# Patient Record
Sex: Female | Born: 1980 | Race: White | Hispanic: No | Marital: Married | Smoking: Current some day smoker
Health system: Southern US, Community
[De-identification: ages and names within clinical notes are randomized; demographics above are authoritative.]

## PROBLEM LIST (undated history)

## (undated) ENCOUNTER — Emergency Department (HOSPITAL_COMMUNITY): Discharge status: Against Medical Advice | Payer: Medicare Other

## (undated) DIAGNOSIS — M79606 Pain in leg, unspecified: Secondary | ICD-10-CM

## (undated) DIAGNOSIS — IMO0002 Reserved for concepts with insufficient information to code with codable children: Secondary | ICD-10-CM

## (undated) DIAGNOSIS — F419 Anxiety disorder, unspecified: Secondary | ICD-10-CM

## (undated) DIAGNOSIS — I639 Cerebral infarction, unspecified: Secondary | ICD-10-CM

## (undated) DIAGNOSIS — M549 Dorsalgia, unspecified: Secondary | ICD-10-CM

## (undated) DIAGNOSIS — G8929 Other chronic pain: Secondary | ICD-10-CM

## (undated) DIAGNOSIS — F32A Depression, unspecified: Secondary | ICD-10-CM

## (undated) DIAGNOSIS — K5090 Crohn's disease, unspecified, without complications: Secondary | ICD-10-CM

## (undated) DIAGNOSIS — F329 Major depressive disorder, single episode, unspecified: Secondary | ICD-10-CM

## (undated) DIAGNOSIS — J449 Chronic obstructive pulmonary disease, unspecified: Secondary | ICD-10-CM

## (undated) HISTORY — PX: APPENDECTOMY: SHX54

---

## 2008-10-06 DIAGNOSIS — E282 Polycystic ovarian syndrome: Secondary | ICD-10-CM | POA: Insufficient documentation

## 2009-03-26 DIAGNOSIS — D519 Vitamin B12 deficiency anemia, unspecified: Secondary | ICD-10-CM | POA: Insufficient documentation

## 2009-03-26 DIAGNOSIS — F1390 Sedative, hypnotic, or anxiolytic use, unspecified, uncomplicated: Secondary | ICD-10-CM | POA: Insufficient documentation

## 2009-03-26 DIAGNOSIS — D649 Anemia, unspecified: Secondary | ICD-10-CM | POA: Insufficient documentation

## 2009-03-28 DIAGNOSIS — M2550 Pain in unspecified joint: Secondary | ICD-10-CM | POA: Insufficient documentation

## 2009-03-28 DIAGNOSIS — M25549 Pain in joints of unspecified hand: Secondary | ICD-10-CM | POA: Insufficient documentation

## 2010-05-22 LAB — HM HEPATITIS C SCREENING LAB: HM Hepatitis Screen: NEGATIVE

## 2010-08-30 ENCOUNTER — Emergency Department: Payer: Self-pay | Admitting: *Deleted

## 2010-09-02 ENCOUNTER — Emergency Department: Payer: Self-pay | Admitting: Unknown Physician Specialty

## 2011-06-09 ENCOUNTER — Emergency Department: Payer: Self-pay | Admitting: Emergency Medicine

## 2011-06-09 LAB — URINALYSIS, COMPLETE
Glucose,UR: NEGATIVE mg/dL (ref 0–75)
Nitrite: NEGATIVE
Ph: 5 (ref 4.5–8.0)
Protein: NEGATIVE
RBC,UR: 2 /HPF (ref 0–5)
Squamous Epithelial: 12
WBC UR: 3 /HPF (ref 0–5)

## 2011-06-14 ENCOUNTER — Emergency Department: Payer: Self-pay | Admitting: Emergency Medicine

## 2011-06-14 LAB — CBC
HCT: 35.1 % (ref 35.0–47.0)
MCH: 27.5 pg (ref 26.0–34.0)
MCHC: 32.4 g/dL (ref 32.0–36.0)
MCV: 85 fL (ref 80–100)
Platelet: 364 10*3/uL (ref 150–440)
RBC: 4.14 10*6/uL (ref 3.80–5.20)
RDW: 15.6 % — ABNORMAL HIGH (ref 11.5–14.5)
WBC: 9.5 10*3/uL (ref 3.6–11.0)

## 2011-06-14 LAB — URINALYSIS, COMPLETE
Bilirubin,UR: NEGATIVE
Blood: NEGATIVE
Glucose,UR: NEGATIVE mg/dL (ref 0–75)
Ketone: NEGATIVE
Nitrite: NEGATIVE
Ph: 8 (ref 4.5–8.0)
Protein: 30
RBC,UR: 7 /HPF (ref 0–5)
Specific Gravity: 1.015 (ref 1.003–1.030)
WBC UR: 99 /HPF (ref 0–5)

## 2011-06-14 LAB — DRUG SCREEN, URINE
Amphetamines, Ur Screen: NEGATIVE (ref ?–1000)
Barbiturates, Ur Screen: NEGATIVE (ref ?–200)
Cocaine Metabolite,Ur ~~LOC~~: NEGATIVE (ref ?–300)
Opiate, Ur Screen: POSITIVE (ref ?–300)
Tricyclic, Ur Screen: POSITIVE (ref ?–1000)

## 2011-06-14 LAB — COMPREHENSIVE METABOLIC PANEL
Albumin: 3.1 g/dL — ABNORMAL LOW (ref 3.4–5.0)
Anion Gap: 5 — ABNORMAL LOW (ref 7–16)
BUN: 15 mg/dL (ref 7–18)
Calcium, Total: 8.6 mg/dL (ref 8.5–10.1)
Chloride: 104 mmol/L (ref 98–107)
Creatinine: 0.91 mg/dL (ref 0.60–1.30)
EGFR (African American): >60
Glucose: 81 mg/dL (ref 65–99)
Osmolality: 276 (ref 275–301)
SGOT(AST): 14 U/L — ABNORMAL LOW (ref 15–37)
SGPT (ALT): 17 U/L
Total Protein: 7.2 g/dL (ref 6.4–8.2)

## 2011-06-14 LAB — LIPASE, BLOOD: Lipase: 77 U/L (ref 73–393)

## 2011-06-14 LAB — PREGNANCY, URINE: Pregnancy Test, Urine: NEGATIVE m[IU]/mL

## 2011-07-02 ENCOUNTER — Emergency Department: Payer: Self-pay | Admitting: *Deleted

## 2011-07-02 LAB — CBC
HGB: 12.2 g/dL (ref 12.0–16.0)
MCHC: 32.2 g/dL (ref 32.0–36.0)
Platelet: 391 10*3/uL (ref 150–440)
RBC: 4.49 10*6/uL (ref 3.80–5.20)
WBC: 11.1 10*3/uL — ABNORMAL HIGH (ref 3.6–11.0)

## 2011-07-02 LAB — DRUG SCREEN, URINE
Barbiturates, Ur Screen: NEGATIVE (ref ?–200)
Benzodiazepine, Ur Scrn: POSITIVE (ref ?–200)
Cannabinoid 50 Ng, Ur ~~LOC~~: NEGATIVE (ref ?–50)
MDMA (Ecstasy)Ur Screen: NEGATIVE (ref ?–500)
Opiate, Ur Screen: POSITIVE (ref ?–300)
Phencyclidine (PCP) Ur S: NEGATIVE (ref ?–25)
Tricyclic, Ur Screen: NEGATIVE (ref ?–1000)

## 2011-07-02 LAB — URINALYSIS, COMPLETE
Bilirubin,UR: NEGATIVE
Blood: NEGATIVE
Ketone: NEGATIVE
Leukocyte Esterase: NEGATIVE
Nitrite: NEGATIVE
Ph: 5 (ref 4.5–8.0)
Protein: NEGATIVE
RBC,UR: 1 /HPF (ref 0–5)
Specific Gravity: 1.009 (ref 1.003–1.030)

## 2011-07-02 LAB — COMPREHENSIVE METABOLIC PANEL
Albumin: 3.9 g/dL (ref 3.4–5.0)
Bilirubin,Total: 0.1 mg/dL — ABNORMAL LOW (ref 0.2–1.0)
EGFR (African American): >60
EGFR (Non-African Amer.): >60
Glucose: 85 mg/dL (ref 65–99)
Osmolality: 297 (ref 275–301)
SGOT(AST): 18 U/L (ref 15–37)
SGPT (ALT): 24 U/L
Total Protein: 8.3 g/dL — ABNORMAL HIGH (ref 6.4–8.2)

## 2011-07-02 LAB — TSH: Thyroid Stimulating Horm: 0.335 u[IU]/mL — ABNORMAL LOW

## 2011-07-02 LAB — ETHANOL: Ethanol: 80 mg/dL

## 2011-07-02 LAB — PREGNANCY, URINE: Pregnancy Test, Urine: NEGATIVE m[IU]/mL

## 2011-07-17 ENCOUNTER — Emergency Department: Payer: Self-pay | Admitting: Emergency Medicine

## 2011-07-17 LAB — COMPREHENSIVE METABOLIC PANEL
Alkaline Phosphatase: 82 U/L (ref 50–136)
Anion Gap: 11 (ref 7–16)
Bilirubin,Total: 0.2 mg/dL (ref 0.2–1.0)
Chloride: 109 mmol/L — ABNORMAL HIGH (ref 98–107)
Creatinine: 0.69 mg/dL (ref 0.60–1.30)
EGFR (African American): >60
EGFR (Non-African Amer.): >60
Osmolality: 286 (ref 275–301)
Potassium: 3.7 mmol/L (ref 3.5–5.1)
SGPT (ALT): 22 U/L
Total Protein: 7.3 g/dL (ref 6.4–8.2)

## 2011-07-17 LAB — LIPASE, BLOOD: Lipase: 129 U/L (ref 73–393)

## 2011-07-17 LAB — CBC
HCT: 35.1 % (ref 35.0–47.0)
MCH: 26.6 pg (ref 26.0–34.0)
MCHC: 31.9 g/dL — ABNORMAL LOW (ref 32.0–36.0)
MCV: 83 fL (ref 80–100)
Platelet: 389 10*3/uL (ref 150–440)
RBC: 4.22 10*6/uL (ref 3.80–5.20)
RDW: 16.1 % — ABNORMAL HIGH (ref 11.5–14.5)

## 2011-07-18 LAB — URINALYSIS, COMPLETE
Glucose,UR: NEGATIVE mg/dL (ref 0–75)
Leukocyte Esterase: NEGATIVE
Nitrite: NEGATIVE
Protein: NEGATIVE
RBC,UR: 2 /HPF (ref 0–5)
Specific Gravity: 1.009 (ref 1.003–1.030)
Squamous Epithelial: 1
WBC UR: 2 /HPF (ref 0–5)

## 2012-02-11 ENCOUNTER — Other Ambulatory Visit: Payer: Self-pay | Admitting: Nurse Practitioner

## 2012-02-11 DIAGNOSIS — M549 Dorsalgia, unspecified: Secondary | ICD-10-CM

## 2012-02-11 DIAGNOSIS — IMO0002 Reserved for concepts with insufficient information to code with codable children: Secondary | ICD-10-CM

## 2012-02-11 DIAGNOSIS — R200 Anesthesia of skin: Secondary | ICD-10-CM

## 2012-02-14 ENCOUNTER — Ambulatory Visit
Admission: RE | Admit: 2012-02-14 | Discharge: 2012-02-14 | Disposition: A | Discharge status: Home / Self Care | Payer: Medicare Other | Source: Ambulatory Visit | Attending: Nurse Practitioner | Admitting: Nurse Practitioner

## 2012-02-14 VITALS — BP 121/85 | HR 97 | Ht 66.0 in | Wt 235.0 lb

## 2012-02-14 DIAGNOSIS — M5126 Other intervertebral disc displacement, lumbar region: Secondary | ICD-10-CM

## 2012-02-14 DIAGNOSIS — M549 Dorsalgia, unspecified: Secondary | ICD-10-CM

## 2012-02-14 DIAGNOSIS — IMO0002 Reserved for concepts with insufficient information to code with codable children: Secondary | ICD-10-CM

## 2012-02-14 DIAGNOSIS — R200 Anesthesia of skin: Secondary | ICD-10-CM

## 2012-02-14 MED ORDER — IOHEXOL 180 MG/ML  SOLN
1.0000 mL | Freq: Once | INTRAMUSCULAR | Status: AC | PRN
  Administered 2012-02-14: 1 mL via EPIDURAL

## 2012-02-14 MED ORDER — METHYLPREDNISOLONE ACETATE 40 MG/ML INJ SUSP (RADIOLOG
120.0000 mg | Freq: Once | INTRAMUSCULAR | Status: AC
  Administered 2012-02-14: 120 mg via EPIDURAL

## 2012-06-13 DIAGNOSIS — M51369 Other intervertebral disc degeneration, lumbar region without mention of lumbar back pain or lower extremity pain: Secondary | ICD-10-CM | POA: Insufficient documentation

## 2012-06-13 DIAGNOSIS — F32A Depression, unspecified: Secondary | ICD-10-CM | POA: Diagnosis present

## 2012-06-13 DIAGNOSIS — F339 Major depressive disorder, recurrent, unspecified: Secondary | ICD-10-CM | POA: Insufficient documentation

## 2012-06-13 DIAGNOSIS — F329 Major depressive disorder, single episode, unspecified: Secondary | ICD-10-CM | POA: Insufficient documentation

## 2012-08-25 DIAGNOSIS — K219 Gastro-esophageal reflux disease without esophagitis: Secondary | ICD-10-CM | POA: Insufficient documentation

## 2012-08-25 DIAGNOSIS — G43909 Migraine, unspecified, not intractable, without status migrainosus: Secondary | ICD-10-CM | POA: Insufficient documentation

## 2012-08-25 DIAGNOSIS — F1920 Other psychoactive substance dependence, uncomplicated: Secondary | ICD-10-CM | POA: Insufficient documentation

## 2012-08-25 DIAGNOSIS — Z8673 Personal history of transient ischemic attack (TIA), and cerebral infarction without residual deficits: Secondary | ICD-10-CM | POA: Insufficient documentation

## 2012-08-25 DIAGNOSIS — K589 Irritable bowel syndrome without diarrhea: Secondary | ICD-10-CM | POA: Insufficient documentation

## 2012-08-25 DIAGNOSIS — M5430 Sciatica, unspecified side: Secondary | ICD-10-CM | POA: Insufficient documentation

## 2012-08-25 DIAGNOSIS — F411 Generalized anxiety disorder: Secondary | ICD-10-CM | POA: Diagnosis present

## 2012-08-25 DIAGNOSIS — N949 Unspecified condition associated with female genital organs and menstrual cycle: Secondary | ICD-10-CM | POA: Insufficient documentation

## 2012-08-25 DIAGNOSIS — R45851 Suicidal ideations: Secondary | ICD-10-CM | POA: Insufficient documentation

## 2012-08-25 DIAGNOSIS — Q7649 Other congenital malformations of spine, not associated with scoliosis: Secondary | ICD-10-CM | POA: Insufficient documentation

## 2012-08-25 DIAGNOSIS — N8300 Follicular cyst of ovary, unspecified side: Secondary | ICD-10-CM | POA: Insufficient documentation

## 2012-08-25 DIAGNOSIS — F410 Panic disorder [episodic paroxysmal anxiety] without agoraphobia: Secondary | ICD-10-CM | POA: Diagnosis present

## 2013-07-17 DIAGNOSIS — R42 Dizziness and giddiness: Secondary | ICD-10-CM | POA: Insufficient documentation

## 2013-07-18 DIAGNOSIS — I951 Orthostatic hypotension: Secondary | ICD-10-CM | POA: Insufficient documentation

## 2013-07-18 DIAGNOSIS — H538 Other visual disturbances: Secondary | ICD-10-CM | POA: Insufficient documentation

## 2013-07-18 DIAGNOSIS — T443X1A Poisoning by other parasympatholytics [anticholinergics and antimuscarinics] and spasmolytics, accidental (unintentional), initial encounter: Secondary | ICD-10-CM | POA: Insufficient documentation

## 2013-07-20 DIAGNOSIS — R0600 Dyspnea, unspecified: Secondary | ICD-10-CM | POA: Insufficient documentation

## 2013-07-20 DIAGNOSIS — G4700 Insomnia, unspecified: Secondary | ICD-10-CM | POA: Insufficient documentation

## 2013-08-10 DIAGNOSIS — E559 Vitamin D deficiency, unspecified: Secondary | ICD-10-CM | POA: Insufficient documentation

## 2013-12-01 ENCOUNTER — Emergency Department (HOSPITAL_COMMUNITY): Payer: Medicare Other

## 2013-12-01 ENCOUNTER — Emergency Department (HOSPITAL_COMMUNITY)
Admission: EM | Admit: 2013-12-01 | Discharge: 2013-12-01 | Disposition: A | Discharge status: Home / Self Care | Payer: Medicare Other | Attending: Emergency Medicine | Admitting: Emergency Medicine

## 2013-12-01 ENCOUNTER — Encounter (HOSPITAL_COMMUNITY): Payer: Self-pay | Admitting: Emergency Medicine

## 2013-12-01 DIAGNOSIS — R109 Unspecified abdominal pain: Secondary | ICD-10-CM | POA: Diagnosis present

## 2013-12-01 DIAGNOSIS — G8929 Other chronic pain: Secondary | ICD-10-CM | POA: Diagnosis not present

## 2013-12-01 DIAGNOSIS — Z3202 Encounter for pregnancy test, result negative: Secondary | ICD-10-CM | POA: Diagnosis not present

## 2013-12-01 DIAGNOSIS — Z792 Long term (current) use of antibiotics: Secondary | ICD-10-CM | POA: Diagnosis not present

## 2013-12-01 DIAGNOSIS — N390 Urinary tract infection, site not specified: Secondary | ICD-10-CM | POA: Insufficient documentation

## 2013-12-01 DIAGNOSIS — F411 Generalized anxiety disorder: Secondary | ICD-10-CM | POA: Diagnosis not present

## 2013-12-01 DIAGNOSIS — M549 Dorsalgia, unspecified: Secondary | ICD-10-CM | POA: Insufficient documentation

## 2013-12-01 DIAGNOSIS — Z79899 Other long term (current) drug therapy: Secondary | ICD-10-CM | POA: Insufficient documentation

## 2013-12-01 DIAGNOSIS — K5090 Crohn's disease, unspecified, without complications: Secondary | ICD-10-CM | POA: Insufficient documentation

## 2013-12-01 HISTORY — DX: Anxiety disorder, unspecified: F41.9

## 2013-12-01 HISTORY — DX: Other chronic pain: G89.29

## 2013-12-01 HISTORY — DX: Crohn's disease, unspecified, without complications: K50.90

## 2013-12-01 HISTORY — DX: Dorsalgia, unspecified: M54.9

## 2013-12-01 LAB — URINE MICROSCOPIC-ADD ON

## 2013-12-01 LAB — RAPID URINE DRUG SCREEN, HOSP PERFORMED
Amphetamines: NOT DETECTED
BARBITURATES: NOT DETECTED
Benzodiazepines: POSITIVE — AB
COCAINE: NOT DETECTED
Opiates: NOT DETECTED
TETRAHYDROCANNABINOL: NOT DETECTED

## 2013-12-01 LAB — URINALYSIS, ROUTINE W REFLEX MICROSCOPIC
BILIRUBIN URINE: NEGATIVE
Glucose, UA: NEGATIVE mg/dL
KETONES UR: NEGATIVE mg/dL
NITRITE: POSITIVE — AB
Protein, ur: 100 mg/dL — AB
Specific Gravity, Urine: 1.014 (ref 1.005–1.030)
Urobilinogen, UA: 0.2 mg/dL (ref 0.0–1.0)
pH: 5.5 (ref 5.0–8.0)

## 2013-12-01 LAB — CBC WITH DIFFERENTIAL/PLATELET
Basophils Absolute: 0 10*3/uL (ref 0.0–0.1)
Basophils Relative: 0 % (ref 0–1)
Eosinophils Absolute: 0.2 10*3/uL (ref 0.0–0.7)
Eosinophils Relative: 2 % (ref 0–5)
HCT: 39 % (ref 36.0–46.0)
Hemoglobin: 12.4 g/dL (ref 12.0–15.0)
LYMPHS PCT: 28 % (ref 12–46)
Lymphs Abs: 3.4 10*3/uL (ref 0.7–4.0)
MCH: 27.4 pg (ref 26.0–34.0)
MCHC: 31.8 g/dL (ref 30.0–36.0)
MCV: 86.3 fL (ref 78.0–100.0)
MONO ABS: 0.6 10*3/uL (ref 0.1–1.0)
Monocytes Relative: 5 % (ref 3–12)
NEUTROS ABS: 8 10*3/uL — AB (ref 1.7–7.7)
Neutrophils Relative %: 65 % (ref 43–77)
Platelets: 402 10*3/uL — ABNORMAL HIGH (ref 150–400)
RBC: 4.52 MIL/uL (ref 3.87–5.11)
RDW: 14.2 % (ref 11.5–15.5)
WBC: 12.2 10*3/uL — ABNORMAL HIGH (ref 4.0–10.5)

## 2013-12-01 LAB — COMPREHENSIVE METABOLIC PANEL
ALT: 15 U/L (ref 0–35)
ANION GAP: 12 (ref 5–15)
AST: 18 U/L (ref 0–37)
Albumin: 3.6 g/dL (ref 3.5–5.2)
Alkaline Phosphatase: 100 U/L (ref 39–117)
BUN: 8 mg/dL (ref 6–23)
CHLORIDE: 109 meq/L (ref 96–112)
CO2: 21 meq/L (ref 19–32)
CREATININE: 0.69 mg/dL (ref 0.50–1.10)
Calcium: 9.3 mg/dL (ref 8.4–10.5)
GFR calc Af Amer: >90 mL/min (ref >90)
GLUCOSE: 80 mg/dL (ref 70–99)
Potassium: 3.8 mEq/L (ref 3.7–5.3)
Sodium: 142 mEq/L (ref 137–147)
Total Bilirubin: 0.2 mg/dL — ABNORMAL LOW (ref 0.3–1.2)
Total Protein: 7.6 g/dL (ref 6.0–8.3)

## 2013-12-01 LAB — LIPASE, BLOOD: LIPASE: 33 U/L (ref 11–59)

## 2013-12-01 LAB — POC URINE PREG, ED: Preg Test, Ur: NEGATIVE

## 2013-12-01 MED ORDER — FENTANYL CITRATE 0.05 MG/ML IJ SOLN
50.0000 ug | Freq: Once | INTRAMUSCULAR | Status: AC
  Administered 2013-12-01: 50 ug via INTRAVENOUS
  Filled 2013-12-01: qty 2

## 2013-12-01 MED ORDER — HYDROMORPHONE HCL PF 1 MG/ML IJ SOLN
1.0000 mg | Freq: Once | INTRAMUSCULAR | Status: AC
  Administered 2013-12-01: 1 mg via INTRAVENOUS
  Filled 2013-12-01: qty 1

## 2013-12-01 MED ORDER — ONDANSETRON HCL 4 MG/2ML IJ SOLN
4.0000 mg | Freq: Once | INTRAMUSCULAR | Status: AC
  Administered 2013-12-01: 4 mg via INTRAVENOUS
  Filled 2013-12-01: qty 2

## 2013-12-01 MED ORDER — IOHEXOL 300 MG/ML  SOLN
100.0000 mL | Freq: Once | INTRAMUSCULAR | Status: AC | PRN
  Administered 2013-12-01: 100 mL via INTRAVENOUS

## 2013-12-01 MED ORDER — CEPHALEXIN 500 MG PO CAPS: 500.0000 mg | ORAL_CAPSULE | Freq: Three times a day (TID) | ORAL | Status: DC

## 2013-12-01 MED ORDER — LOPERAMIDE HCL 2 MG PO CAPS: 2.0000 mg | ORAL_CAPSULE | Freq: Four times a day (QID) | ORAL | Status: DC | PRN

## 2013-12-01 MED ORDER — DEXTROSE 5 % IV SOLN
1.0000 g | Freq: Once | INTRAVENOUS | Status: AC
  Administered 2013-12-01: 1 g via INTRAVENOUS
  Filled 2013-12-01: qty 10

## 2013-12-01 MED ORDER — PROMETHAZINE HCL 25 MG RE SUPP: 25.0000 mg | Freq: Four times a day (QID) | RECTAL | Status: DC | PRN

## 2013-12-01 MED ORDER — SODIUM CHLORIDE 0.9 % IV BOLUS (SEPSIS)
1000.0000 mL | Freq: Once | INTRAVENOUS | Status: AC
  Administered 2013-12-01: 1000 mL via INTRAVENOUS

## 2013-12-01 MED ORDER — KETOROLAC TROMETHAMINE 30 MG/ML IJ SOLN
30.0000 mg | Freq: Once | INTRAMUSCULAR | Status: AC
  Administered 2013-12-01: 30 mg via INTRAVENOUS
  Filled 2013-12-01: qty 1

## 2013-12-01 MED ORDER — POTASSIUM CHLORIDE CRYS ER 20 MEQ PO TBCR
40.0000 meq | EXTENDED_RELEASE_TABLET | Freq: Once | ORAL | Status: DC
  Filled 2013-12-01: qty 2

## 2013-12-01 NOTE — Discharge Instructions (Signed)
Urinary Tract Infection Urinary tract infections (UTIs) can develop anywhere along your urinary tract. Your urinary tract is your body's drainage system for removing wastes and extra water. Your urinary tract includes two kidneys, two ureters, a bladder, and a urethra. Your kidneys are a pair of bean-shaped organs. Each kidney is about the size of your fist. They are located below your ribs, one on each side of your spine. CAUSES Infections are caused by microbes, which are microscopic organisms, including fungi, viruses, and bacteria. These organisms are so small that they can only be seen through a microscope. Bacteria are the microbes that most commonly cause UTIs. SYMPTOMS  Symptoms of UTIs may vary by age and gender of the patient and by the location of the infection. Symptoms in young women typically include a frequent and intense urge to urinate and a painful, burning feeling in the bladder or urethra during urination. Older women and men are more likely to be tired, shaky, and weak and have muscle aches and abdominal pain. A fever may mean the infection is in your kidneys. Other symptoms of a kidney infection include pain in your back or sides below the ribs, nausea, and vomiting. DIAGNOSIS To diagnose a UTI, your caregiver will ask you about your symptoms. Your caregiver also will ask to provide a urine sample. The urine sample will be tested for bacteria and white blood cells. White blood cells are made by your body to help fight infection. TREATMENT  Typically, UTIs can be treated with medication. Because most UTIs are caused by a bacterial infection, they usually can be treated with the use of antibiotics. The choice of antibiotic and length of treatment depend on your symptoms and the type of bacteria causing your infection. HOME CARE INSTRUCTIONS  If you were prescribed antibiotics, take them exactly as your caregiver instructs you. Finish the medication even if you feel better after you  have only taken some of the medication.  Drink enough water and fluids to keep your urine clear or pale yellow.  Avoid caffeine, tea, and carbonated beverages. They tend to irritate your bladder.  Empty your bladder often. Avoid holding urine for long periods of time.  Empty your bladder before and after sexual intercourse.  After a bowel movement, women should cleanse from front to back. Use each tissue only once. SEEK MEDICAL CARE IF:   You have back pain.  You develop a fever.  Your symptoms do not begin to resolve within 3 days. SEEK IMMEDIATE MEDICAL CARE IF:   You have severe back pain or lower abdominal pain.  You develop chills.  You have nausea or vomiting.  You have continued burning or discomfort with urination. MAKE SURE YOU:   Understand these instructions.  Will watch your condition.  Will get help right away if you are not doing well or get worse. Document Released: 01/17/2005 Document Revised: 10/09/2011 Document Reviewed: 05/18/2011 [facility] Patient Information 2015 [place], [place]. This information is not intended to replace advice given to you by your health care provider. Make sure you discuss any questions you have with your health care provider.  Crohn Disease Crohn disease is a long-term (chronic) soreness and redness (inflammation) of the intestines (bowel). It can affect any portion of the digestive tract, from the mouth to the anus. It can also cause problems outside the digestive tract. Crohn disease is closely related to a disease called ulcerative colitis (together, these two diseases are called inflammatory bowel disease).  CAUSES  The cause of  Crohn disease is not known. One Nelva Bush is that, in an easily affected person, the immune system is triggered to attack the body's own digestive tissue. Crohn disease runs in families. It seems to be more common in certain geographic areas and amongst certain races. There are no clear-cut dietary causes.    SYMPTOMS  Crohn disease can cause many different symptoms since it can affect many different parts of the body. Symptoms include:  Fatigue.  Weight loss.  Chronic diarrhea, sometime bloody.  Abdominal pain and cramps.  Fever.  Ulcers or canker sores in the mouth or rectum.  Anemia (low red blood cells).  Arthritis, skin problems, and eye problems may occur. Complications of Crohn disease can include:  Series of holes (perforation) of the bowel.  Portions of the intestines sticking to each other (adhesions).  Obstruction of the bowel.  Fistula formation, typically in the rectal area but also in other areas. A fistula is an opening between the bowels and the outside, or between the bowels and another organ.  A painful crack in the mucous membrane of the anus (rectal fissure). DIAGNOSIS  Your caregiver may suspect Crohn disease based on your symptoms and an exam. Blood tests may confirm that there is a problem. You may be asked to submit a stool specimen for examination. X-rays and CT scans may be necessary. Ultimately, the diagnosis is usually made after a procedure that uses a flexible tube that is inserted via your mouth or your anus. This is done under sedation and is called either an upper endoscopy or colonoscopy. With these tests, the specialist can take tiny tissue samples and remove them from the inside of the bowel (biopsy). Examination of this biopsy tissue under a microscope can reveal Crohn disease as the cause of your symptoms. Due to the many different forms that Crohn disease can take, symptoms may be present for several years before a diagnosis is made. TREATMENT  Medications are often used to decrease inflammation and control the immune system. These include medicines related to aspirin, steroid medications, and newer and stronger medications to slow down the immune system. Some medications may be used as suppositories or enemas. A number of other medications are  used or have been studied. Your caregiver will make specific recommendations. HOME CARE INSTRUCTIONS   Symptoms such as diarrhea can be controlled with medications. Avoid foods that have a laxative effect such as fresh fruit, vegetables, and dairy products. During flare-ups, you can rest your bowel by refraining from solid foods. Drink clear liquids frequently during the day. (Electrolyte or rehydrating fluids are best. Your caregiver can help you with suggestions.) Drink often to prevent loss of body fluids (dehydration). When diarrhea has cleared, eat small meals and more frequently. Avoid food additives and stimulants such as caffeine (coffee, tea, or chocolate). Enzyme supplements may help if you develop intolerance to a sugar in dairy products (lactose). Ask your caregiver or dietitian about specific dietary instructions.  Try to maintain a positive attitude. Learn relaxation techniques such as self-hypnosis, mental imaging, and muscle relaxation.  If possible, avoid stresses which can aggravate your condition.  Exercise regularly.  Follow your diet.  Always get plenty of rest. SEEK MEDICAL CARE IF:   Your symptoms fail to improve after a week or two of new treatment.  You experience continued weight loss.  You have ongoing cramps or loose bowels.  You develop a new skin rash, skin sores, or eye problems. SEEK IMMEDIATE MEDICAL CARE IF:   You  have worsening of your symptoms or develop new symptoms.  You have a fever.  You develop bloody diarrhea.  You develop severe abdominal pain. MAKE SURE YOU:   Understand these instructions.  Will watch your condition.  Will get help right away if you are not doing well or get worse. Document Released: 01/17/2005 Document Revised: 08/24/2013 Document Reviewed: 12/16/2006 [facility] Patient Information 2015 [place], [place]. This information is not intended to replace advice given to you by your health care provider. Make sure you discuss  any questions you have with your health care provider.

## 2013-12-01 NOTE — ED Notes (Addendum)
Pt states that she was given a fluid bolus at PCP 2 days ago in [place] because she was having a Crohn's attack. Pt went to multiple doctor's offices today and ultimately ended up at [facility] in [place] requesting fluids. UC stated that they did not do that there and so pt requested to come to [facility]. Has not been able to take home oxycodone, morphine or klonopin at home because they are expensive and she could not get them filled.22g L hand. ODT 4mg  Zofran given en route. Alert and oriented.

## 2013-12-01 NOTE — ED Provider Notes (Signed)
CSN: [id number]     Arrival date & time 12/01/13  1726 History   First MD Initiated Contact with Patient 12/01/13 1829     Chief Complaint  Patient presents with  . Abdominal Pain  . Back Pain   Patient is a 33 y.o. female presenting with abdominal pain and back pain. The history is provided by the patient. No language interpreter was used.  Abdominal Pain Pain location:  Generalized Pain quality: aching and sharp   Pain radiates to:  Back Pain severity:  Severe Onset quality:  Sudden Duration:  3 days Timing:  Constant Progression:  Worsening Chronicity:  Chronic (secondary to chrons disease) Context: not alcohol use, not awakening from sleep, not diet changes, not eating, not laxative use, not medication withdrawal, not previous surgeries, not recent illness, not recent sexual activity, not recent travel, not retching, not sick contacts, not suspicious food intake and not trauma   Relieved by:  Nothing Worsened by:  Bowel movements Ineffective treatments:  Bowel activity, heat and NSAIDs Associated symptoms: chills, diarrhea, fever, flatus, nausea and vomiting   Associated symptoms: no anorexia, no belching, no chest pain, no constipation, no cough, no dysuria, no fatigue, no hematemesis, no hematochezia, no hematuria, no melena, no shortness of breath, no sore throat, no vaginal bleeding and no vaginal discharge   Risk factors: no alcohol abuse, no aspirin use, not elderly, has not had multiple surgeries, no NSAID use, not obese, not pregnant and no recent hospitalization   Back Pain Associated symptoms: abdominal pain and fever   Associated symptoms: no chest pain and no dysuria     Past Medical History  Diagnosis Date  . Crohn's disease   . Chronic back pain   . Anxiety    No past surgical history on file. No family history on file. History  Substance Use Topics  . Smoking status: Not on file  . Smokeless tobacco: Not on file  . Alcohol Use: Not on file   OB History    Grav Para Term Preterm Abortions TAB SAB Ect Mult Living                 Review of Systems  Constitutional: Positive for fever and chills. Negative for fatigue.  HENT: Negative for sore throat.   Respiratory: Negative for cough and shortness of breath.   Cardiovascular: Negative for chest pain.  Gastrointestinal: Positive for nausea, vomiting, abdominal pain, diarrhea and flatus. Negative for constipation, melena, hematochezia, anorexia and hematemesis.  Genitourinary: Negative for dysuria, hematuria, vaginal bleeding and vaginal discharge.  Musculoskeletal: Positive for back pain.      Allergies  Gabapentin  Home Medications   Prior to Admission medications   Medication Sig Start Date End Date Taking? Authorizing Provider  albuterol (PROVENTIL) (2.5 MG/3ML) 0.083% nebulizer solution Take 2.5 mg by nebulization 2 (two) times daily as needed for wheezing or shortness of breath.   Yes Historical Provider, MD  clonazePAM (KLONOPIN) 2 MG tablet Take 2 mg by mouth 3 (three) times daily.   Yes Historical Provider, MD  dicyclomine (BENTYL) 20 MG tablet Take 20 mg by mouth 4 (four) times daily as needed for spasms.   Yes Historical Provider, MD  hydrOXYzine (ATARAX/VISTARIL) 50 MG tablet Take 100 mg by mouth at bedtime.   Yes Historical Provider, MD  ibuprofen (ADVIL,MOTRIN) 200 MG tablet Take 800 mg by mouth at bedtime.   Yes Historical Provider, MD  morphine (MS CONTIN) 30 MG 12 hr tablet Take 30 mg by mouth  every 12 (twelve) hours.   Yes Historical Provider, MD  Oxycodone HCl 10 MG TABS Take 10 mg by mouth 4 (four) times daily.   Yes Historical Provider, MD  cephALEXin (KEFLEX) 500 MG capsule Take 1 capsule (500 mg total) by mouth 3 (three) times daily. 12/01/13   Auden Wettstein A Forcucci, PA-C  loperamide (IMODIUM) 2 MG capsule Take 1 capsule (2 mg total) by mouth 4 (four) times daily as needed for diarrhea or loose stools. 12/01/13   Marvel Sapp A Forcucci, PA-C  promethazine (PHENERGAN) 25 MG  suppository Place 1 suppository (25 mg total) rectally every 6 (six) hours as needed for nausea or vomiting. 12/01/13   Americus Scheurich A Forcucci, PA-C   BP 95/67  Pulse 68  Temp(Src) 97.9 F (36.6 C) (Oral)  SpO2 100%  LMP 11/10/2013 Physical Exam  Nursing note and vitals reviewed. Constitutional: She is oriented to person, place, and time. She appears well-developed and well-nourished. No distress.  HENT:  Head: Normocephalic and atraumatic.  Mouth/Throat: Oropharynx is clear and moist. No oropharyngeal exudate.  Eyes: Conjunctivae and EOM are normal. Pupils are equal, round, and reactive to light. No scleral icterus.  Neck: Normal range of motion. Neck supple. No JVD present. No thyromegaly present.  Cardiovascular: Normal rate, regular rhythm, normal heart sounds and intact distal pulses.  Exam reveals no gallop and no friction rub.   No murmur heard. Pulmonary/Chest: Effort normal and breath sounds normal. No respiratory distress. She has no wheezes. She has no rales. She exhibits no tenderness.  Abdominal: Soft. Normal appearance and bowel sounds are normal. She exhibits no distension and no mass. There is no hepatosplenomegaly. There is generalized tenderness. There is no rebound, no guarding, no CVA tenderness, no tenderness at McBurney's point and negative Murphy's sign.  Musculoskeletal: Normal range of motion.  Lymphadenopathy:    She has no cervical adenopathy.  Neurological: She is alert and oriented to person, place, and time. No cranial nerve deficit. Coordination normal.  Skin: Skin is warm and dry. She is not diaphoretic.  Psychiatric: She has a normal mood and affect. Her behavior is normal. Judgment and thought content normal.    ED Course  Procedures (including critical care time) Labs Review Labs Reviewed  CBC WITH DIFFERENTIAL - Abnormal; Notable for the following:    WBC 12.2 (*)    Platelets 402 (*)    Neutro Abs 8.0 (*)    All other components within normal  limits  COMPREHENSIVE METABOLIC PANEL - Abnormal; Notable for the following:    Total Bilirubin 0.2 (*)    All other components within normal limits  URINALYSIS, ROUTINE W REFLEX MICROSCOPIC - Abnormal; Notable for the following:    APPearance TURBID (*)    Hgb urine dipstick LARGE (*)    Protein, ur 100 (*)    Nitrite POSITIVE (*)    Leukocytes, UA LARGE (*)    All other components within normal limits  URINE RAPID DRUG SCREEN (HOSP PERFORMED) - Abnormal; Notable for the following:    Benzodiazepines POSITIVE (*)    All other components within normal limits  URINE MICROSCOPIC-ADD ON - Abnormal; Notable for the following:    Bacteria, UA MANY (*)    All other components within normal limits  LIPASE, BLOOD  POC URINE PREG, ED    Imaging Review Ct Abdomen Pelvis W Contrast  12/01/2013   CLINICAL DATA:  Abdominal pain with nausea and vomiting ; history of Crohn's disease  EXAM: CT ABDOMEN AND PELVIS WITH CONTRAST  TECHNIQUE: Multidetector CT imaging of the abdomen and pelvis was performed using the standard protocol following bolus administration of intravenous contrast. Oral contrast was also administered.  CONTRAST:  100mL OMNIPAQUE IOHEXOL 300 MG/ML  SOLN  COMPARISON:  None.  FINDINGS: There is mild bibasilar atelectatic change. Lung bases are otherwise clear.  Liver is prominent, measuring 17.3 cm in length. There is a tiny cyst in the posterior segment of the right lobe of the liver. No other focal liver lesions are identified. Gallbladder wall is not thickened. There is no biliary duct dilatation.  Spleen, pancreas, and adrenals appear normal. Kidneys bilaterally show no mass or hydronephrosis on either side. There is no renal or ureteral calculus on either side.  In the pelvis, the wall of the urinary bladder is upper normal in thickness. Urinary bladder is midline. There is no pelvic mass or fluid collection. There appears to be some thickening of the wall of the rectum. There is no  perirectal fistula or mesenteric thickening.  There is evidence of previous surgery in the at ascending colonic region with clips in this area. Appendix is not seen; question previous appendectomy. Terminal ileum appears unremarkable on this study. There is no wall thickening or fistula in this area.  There is a small ventral hernia containing only fat.  There is no bowel obstruction. No free air or portal venous air. There is no ascites, adenopathy, or abscess in the abdomen or pelvis. There is no abdominal aortic aneurysm. There are no blastic or lytic bone lesions. There is thoracolumbar levoscoliosis.  IMPRESSION: Rectal wall thickening consistent with a degree of proctitis. There is no fistula or perirectal stranding in the adjacent soft tissues.  The wall of the urinary bladder is upper normal in thickness. Mild cystitis cannot be excluded on this study. There is no renal or ureteral calculus. No hydronephrosis.  Question previous appendectomy. Terminal ileal region appears normal. No bowel obstruction. No abscess.  Liver is prominent.  Small ventral hernia containing only fat.   Electronically Signed   By: Bretta BangWilliam  Woodruff M.D.   On: 12/01/2013 21:55   Dg Abd 2 Views  12/01/2013   CLINICAL DATA:  Abdominal pain  EXAM: ABDOMEN - 2 VIEW  COMPARISON:  None.  FINDINGS: Frontal and left lateral decubitus images were obtained. There is contrast in the colon. There is moderate stool in the colon. There is a paucity of gas overall. No free air is seen. No abnormal calcifications.  IMPRESSION: Moderate diffuse stool in colon. Paucity of gas. This finding may be seen normally but also may be seen with enteritis or early ileus. Obstruction can also present in this manner but is felt to be less likely. No free air.   Electronically Signed   By: Bretta BangWilliam  Woodruff M.D.   On: 12/01/2013 19:30     EKG Interpretation None      MDM   Final diagnoses:  Crohn disease, without complications  UTI (lower urinary  tract infection)   Patient is a 33 y.o. Female who presents the ED with abdominal pain and back pain.  CBC shows mild leukocytosis at this time.   CMP shows mild hypokalemia which was treated here.  Lipase here is without abnormality at this time.  Plain film of the abdomen shows diffuse stool with possible ileus vs. Small bowel obstruction.  CT scan of the abdomen shows small ventral hernia non-incarcerated, mild procitis likely secondary to chron's disease, and no bowel obstruction.  UA shows UTI.  Have treated  the patient here with fluids, zofran, 1g ceftriaxone, and pain medication.  Patient is tolerating PO fluids here at this time.  Will discharge the patient home with keflex 500 mg TID x 7 days, phenergan suppository, and immodium and will refer the patient to GI for further follow-up of her Crohns disease.  Patient is stable for discharge at this time.  I have spoken with Dr. Denton Lank who agrees with the above plan.    Eben Burow, PA-C 12/01/13 2303

## 2013-12-01 NOTE — ED Notes (Signed)
Pt. Is aware that we need a urine specimen but is not able to go at this time.

## 2013-12-01 NOTE — ED Notes (Signed)
Patient transported to CT, will obtain vitals when pt returns.

## 2013-12-01 NOTE — ED Notes (Signed)
Let pt know that we need a urine sample from her, pt was hard to understand and had trouble staying awake while talking to me. Pt stated that she did not need to use the bathroom right now

## 2013-12-01 NOTE — ED Notes (Signed)
Bed: WA17 Expected date:  Expected time:  Means of arrival:  Comments: EMS-abdominal pain 

## 2013-12-02 NOTE — ED Provider Notes (Signed)
Medical screening examination/treatment/procedure(s) were conducted as a shared visit with non-physician practitioner(s) and myself.  I personally evaluated the patient during the encounter.  Pt with hx crohns, c/o suprapubic pain, dysyuria. Also has had few loose stools although no bloody bms, no fevers. abd soft, suprapubic tenderness. No peritoneal signs. Labs.    Denise Roots, MD 12/02/13 Marlyne Beards

## 2013-12-08 ENCOUNTER — Encounter: Payer: Medicare Other | Admitting: Obstetrics & Gynecology

## 2013-12-30 ENCOUNTER — Encounter (HOSPITAL_COMMUNITY): Payer: Self-pay | Admitting: Emergency Medicine

## 2013-12-30 ENCOUNTER — Emergency Department (HOSPITAL_COMMUNITY)
Admission: EM | Admit: 2013-12-30 | Discharge: 2013-12-30 | Disposition: A | Discharge status: Home / Self Care | Payer: Medicare Other | Attending: Emergency Medicine | Admitting: Emergency Medicine

## 2013-12-30 ENCOUNTER — Emergency Department (HOSPITAL_COMMUNITY): Payer: Medicare Other

## 2013-12-30 DIAGNOSIS — K920 Hematemesis: Secondary | ICD-10-CM | POA: Diagnosis not present

## 2013-12-30 DIAGNOSIS — R109 Unspecified abdominal pain: Secondary | ICD-10-CM | POA: Insufficient documentation

## 2013-12-30 DIAGNOSIS — Z79899 Other long term (current) drug therapy: Secondary | ICD-10-CM | POA: Insufficient documentation

## 2013-12-30 DIAGNOSIS — F17200 Nicotine dependence, unspecified, uncomplicated: Secondary | ICD-10-CM | POA: Insufficient documentation

## 2013-12-30 DIAGNOSIS — F3289 Other specified depressive episodes: Secondary | ICD-10-CM | POA: Insufficient documentation

## 2013-12-30 DIAGNOSIS — Z8739 Personal history of other diseases of the musculoskeletal system and connective tissue: Secondary | ICD-10-CM | POA: Diagnosis not present

## 2013-12-30 DIAGNOSIS — F411 Generalized anxiety disorder: Secondary | ICD-10-CM | POA: Diagnosis not present

## 2013-12-30 DIAGNOSIS — Z9089 Acquired absence of other organs: Secondary | ICD-10-CM | POA: Diagnosis not present

## 2013-12-30 DIAGNOSIS — G8929 Other chronic pain: Secondary | ICD-10-CM | POA: Insufficient documentation

## 2013-12-30 DIAGNOSIS — F329 Major depressive disorder, single episode, unspecified: Secondary | ICD-10-CM | POA: Diagnosis not present

## 2013-12-30 DIAGNOSIS — J441 Chronic obstructive pulmonary disease with (acute) exacerbation: Secondary | ICD-10-CM | POA: Insufficient documentation

## 2013-12-30 DIAGNOSIS — R079 Chest pain, unspecified: Secondary | ICD-10-CM | POA: Diagnosis present

## 2013-12-30 DIAGNOSIS — Z3202 Encounter for pregnancy test, result negative: Secondary | ICD-10-CM | POA: Diagnosis not present

## 2013-12-30 HISTORY — DX: Other chronic pain: G89.29

## 2013-12-30 HISTORY — DX: Pain in leg, unspecified: M79.606

## 2013-12-30 HISTORY — DX: Reserved for concepts with insufficient information to code with codable children: IMO0002

## 2013-12-30 HISTORY — DX: Chronic obstructive pulmonary disease, unspecified: J44.9

## 2013-12-30 LAB — CBC WITH DIFFERENTIAL/PLATELET
Basophils Absolute: 0 10*3/uL (ref 0.0–0.1)
Basophils Relative: 0 % (ref 0–1)
Eosinophils Absolute: 0 10*3/uL (ref 0.0–0.7)
Eosinophils Relative: 0 % (ref 0–5)
HEMATOCRIT: 36.2 % (ref 36.0–46.0)
HEMOGLOBIN: 11.8 g/dL — AB (ref 12.0–15.0)
LYMPHS PCT: 14 % (ref 12–46)
Lymphs Abs: 1.6 10*3/uL (ref 0.7–4.0)
MCH: 28.3 pg (ref 26.0–34.0)
MCHC: 32.6 g/dL (ref 30.0–36.0)
MCV: 86.8 fL (ref 78.0–100.0)
MONO ABS: 0.6 10*3/uL (ref 0.1–1.0)
MONOS PCT: 5 % (ref 3–12)
NEUTROS ABS: 9.3 10*3/uL — AB (ref 1.7–7.7)
Neutrophils Relative %: 81 % — ABNORMAL HIGH (ref 43–77)
Platelets: 360 10*3/uL (ref 150–400)
RBC: 4.17 MIL/uL (ref 3.87–5.11)
RDW: 15 % (ref 11.5–15.5)
WBC: 11.4 10*3/uL — AB (ref 4.0–10.5)

## 2013-12-30 LAB — COMPREHENSIVE METABOLIC PANEL
ALT: 14 U/L (ref 0–35)
ANION GAP: 16 — AB (ref 5–15)
AST: 12 U/L (ref 0–37)
Albumin: 3.3 g/dL — ABNORMAL LOW (ref 3.5–5.2)
Alkaline Phosphatase: 92 U/L (ref 39–117)
BUN: 17 mg/dL (ref 6–23)
CHLORIDE: 105 meq/L (ref 96–112)
CO2: 18 mEq/L — ABNORMAL LOW (ref 19–32)
CREATININE: 0.53 mg/dL (ref 0.50–1.10)
Calcium: 9.1 mg/dL (ref 8.4–10.5)
GFR calc Af Amer: >90 mL/min (ref >90)
GFR calc non Af Amer: >90 mL/min (ref >90)
GLUCOSE: 184 mg/dL — AB (ref 70–99)
Potassium: 4 mEq/L (ref 3.7–5.3)
Sodium: 139 mEq/L (ref 137–147)
Total Protein: 7.4 g/dL (ref 6.0–8.3)

## 2013-12-30 LAB — POC URINE PREG, ED: Preg Test, Ur: NEGATIVE

## 2013-12-30 LAB — URINALYSIS, ROUTINE W REFLEX MICROSCOPIC
BILIRUBIN URINE: NEGATIVE
Glucose, UA: 500 mg/dL — AB
Hgb urine dipstick: NEGATIVE
KETONES UR: NEGATIVE mg/dL
Leukocytes, UA: NEGATIVE
NITRITE: NEGATIVE
PH: 6 (ref 5.0–8.0)
Protein, ur: NEGATIVE mg/dL
Specific Gravity, Urine: 1.028 (ref 1.005–1.030)
Urobilinogen, UA: 0.2 mg/dL (ref 0.0–1.0)

## 2013-12-30 LAB — LIPASE, BLOOD: Lipase: 18 U/L (ref 11–59)

## 2013-12-30 MED ORDER — ONDANSETRON HCL 4 MG/2ML IJ SOLN
4.0000 mg | Freq: Once | INTRAMUSCULAR | Status: AC
  Administered 2013-12-30: 4 mg via INTRAVENOUS
  Filled 2013-12-30: qty 2

## 2013-12-30 MED ORDER — HYDROMORPHONE HCL PF 1 MG/ML IJ SOLN
1.0000 mg | Freq: Once | INTRAMUSCULAR | Status: AC
  Administered 2013-12-30: 1 mg via INTRAVENOUS
  Filled 2013-12-30: qty 1

## 2013-12-30 MED ORDER — SODIUM CHLORIDE 0.9 % IV BOLUS (SEPSIS)
1000.0000 mL | Freq: Once | INTRAVENOUS | Status: AC
  Administered 2013-12-30: 1000 mL via INTRAVENOUS

## 2013-12-30 MED ORDER — MORPHINE SULFATE 4 MG/ML IJ SOLN
6.0000 mg | Freq: Once | INTRAMUSCULAR | Status: AC
  Administered 2013-12-30: 6 mg via INTRAVENOUS
  Filled 2013-12-30: qty 2

## 2013-12-30 MED ORDER — IOHEXOL 300 MG/ML  SOLN
80.0000 mL | Freq: Once | INTRAMUSCULAR | Status: AC | PRN
  Administered 2013-12-30: 80 mL via INTRAVENOUS

## 2013-12-30 MED ORDER — IOHEXOL 300 MG/ML  SOLN
25.0000 mL | Freq: Once | INTRAMUSCULAR | Status: AC | PRN
  Administered 2013-12-30: 25 mL via ORAL

## 2013-12-30 NOTE — ED Notes (Signed)
Pt finished drinking oral CT contrast, Kelly in CT made aware.

## 2013-12-30 NOTE — ED Notes (Signed)
Back from xray, "feel worse". Alert, NAD, calm, interactive, no dyspnea noted. Family at [facility].

## 2013-12-30 NOTE — ED Notes (Signed)
Informed patient a urine sample is needed. Pt reports she will inform staff when she has to urinate.

## 2013-12-30 NOTE — Discharge Instructions (Signed)
Followup with your providers and also the GI consult for further evaluation of abdominal pain. Continue your current meds. No acute findings today.

## 2013-12-30 NOTE — ED Notes (Signed)
Pt asked for a sponge with water to wet her mouth. Gave pt the items and left the cup with a small amount of water, instructed her not to drink the water and to let us know as soon as she can urinate so we can order the chest xray.

## 2013-12-30 NOTE — ED Notes (Signed)
Pt reports she still does not have to urinate at this moment.

## 2013-12-30 NOTE — ED Provider Notes (Signed)
CSN: [id number]     Arrival date & time 12/30/13  0331 History   First MD Initiated Contact with Patient 12/30/13 0350     Chief Complaint  Patient presents with  . Shortness of Breath  . Chest Pain     (Consider location/radiation/quality/duration/timing/severity/associated sxs/prior Treatment) HPI  Denise Ellis is a 33 y.o. female with a past medical history of Crohn's disease coming in with feeling sick for the past 3 days. Patient states she's had hot and cold flashes and chills. She denies any sick contacts. She states her abdomen hurts diffusely. She states she is scheduled to have surgery performed with a major resection. She states that she has vomited 8 or 9 times a day with some blood in it. She states she has not been able to keep anything down. She denies any changes in her bowel movements or blood in her bowel movements. He denies any changes in her urination, vaginal discharge, vaginal bleeding. She has had unprotected sex with her husband. There is associated shortness of breath. Patient did not describe any chest pain to meet despite the triage note. Patient himself 2 albuterol treatments at home because she was concerned about COPD.  She denies any change in her cough.  10 Systems reviewed and are negative for acute change except as noted in the HPI.     Past Medical History  Diagnosis Date  . COPD (chronic obstructive pulmonary disease)   . DDD (degenerative disc disease)   . Depressed   . Anxiety    Past Surgical History  Procedure Laterality Date  . Appendectomy     No family history on file. History  Substance Use Topics  . Smoking status: Current Some Day Smoker -- 0.20 packs/day for 18 years    Types: Cigarettes  . Smokeless tobacco: Never Used  . Alcohol Use: Not on file   OB History   Grav Para Term Preterm Abortions TAB SAB Ect Mult Living                 Review of Systems    Allergies  Gadolinium derivatives  Home Medications   Prior to  Admission medications   Medication Sig Start Date End Date Taking? Authorizing Provider  albuterol (PROVENTIL) (2.5 MG/3ML) 0.083% nebulizer solution Take 2.5 mg by nebulization every 6 (six) hours as needed for wheezing or shortness of breath.   Yes Historical Provider, MD  clonazePAM (KLONOPIN) 2 MG tablet Take 2 mg by mouth 3 (three) times daily.   Yes Historical Provider, MD  Oxycodone HCl 10 MG TABS Take 10 mg by mouth 4 (four) times daily.   Yes Historical Provider, MD  OxyCODONE HCl ER (OXYCONTIN) 30 MG T12A Take 30 mg by mouth 2 (two) times daily.   Yes Historical Provider, MD   BP 110/63  Pulse 94  Temp(Src) 98.9 F (37.2 C) (Oral)  Resp 20  Ht 5\' 6"  (1.676 m)  Wt 210 lb (95.255 kg)  BMI 33.91 kg/m2  SpO2 97% Physical Exam  Nursing note and vitals reviewed. Constitutional: She is oriented to person, place, and time. She appears well-developed and well-nourished. No distress.  HENT:  Head: Normocephalic and atraumatic.  Nose: Nose normal.  Mouth/Throat: Oropharynx is clear and moist. No oropharyngeal exudate.  Eyes: Conjunctivae and EOM are normal. Pupils are equal, round, and reactive to light. No scleral icterus.  Neck: Normal range of motion. Neck supple. No JVD present. No tracheal deviation present. No thyromegaly present.  Cardiovascular: Normal rate,  regular rhythm and normal heart sounds.  Exam reveals no gallop and no friction rub.   No murmur heard. Pulmonary/Chest: Effort normal and breath sounds normal. No respiratory distress. She has no wheezes. She exhibits no tenderness.  Abdominal: Soft. Bowel sounds are normal. She exhibits no distension and no mass. There is no tenderness. There is no rebound and no guarding.  Initial tenderness on exam, but easily distractible to conversation. No tenderness when pressing with my stethoscope.  Musculoskeletal: Normal range of motion. She exhibits no edema and no tenderness.  Lymphadenopathy:    She has no cervical  adenopathy.  Neurological: She is alert and oriented to person, place, and time. No cranial nerve deficit. She exhibits normal muscle tone.  Skin: Skin is warm and dry. No rash noted. She is not diaphoretic. No erythema. No pallor.    ED Course  Procedures (including critical care time) Labs Review Labs Reviewed  CBC WITH DIFFERENTIAL - Abnormal; Notable for the following:    WBC 11.4 (*)    Hemoglobin 11.8 (*)    Neutrophils Relative % 81 (*)    Neutro Abs 9.3 (*)    All other components within normal limits  COMPREHENSIVE METABOLIC PANEL  LIPASE, BLOOD  URINALYSIS, ROUTINE W REFLEX MICROSCOPIC  PREGNANCY, URINE    Imaging Review No results found.   EKG Interpretation   Date/Time:  Wednesday December 30 2013 03:41:51 EDT Ventricular Rate:  94 PR Interval:  156 QRS Duration: 84 QT Interval:  368 QTC Calculation: 460 R Axis:   91 Text Interpretation:  Sinus rhythm Borderline right axis deviation  Confirmed by Erroll Luna [id number]) on 12/30/2013 5:02:23 AM      MDM   Final diagnoses:  None    Patient is to emergency department out of concern for fevers chills and vomiting.  She states she has not kept anything down in 3 days and has vomited 8 times per day. Despite this her oropharynx is moist and she has no tachycardia on exam.  Patient was given morphine and Zofran for relief. 1 L fluid bolus. We'll obtain laboratory studies and reassess. I concern for an acute complication of Crohn disease at this time is low.  Patient has persistent pain and tenderness on exam.  Will obtain CT scan as I can not see an old one in her chart.  Patient signed out to oncoming physician, please see his note for the ultimate disposition of this patient.  Tentative plan is CT scan, treat pain, and DC home with close fu.    Tomasita Crumble, MD 12/30/13 1810

## 2013-12-30 NOTE — ED Notes (Signed)
Pt ambulated to restroom with slow gait with assistance of walker

## 2013-12-30 NOTE — ED Notes (Signed)
This RN informed Aundra Millet at x-ray that the patient is ready to be transported to x-ray since her urine pregnancy test is negative.

## 2013-12-30 NOTE — ED Notes (Signed)
Dr. Oni at bedside. 

## 2013-12-30 NOTE — ED Notes (Signed)
Per ems: pt from home, reports SOB and central chest tightness that started last night, has been progressively getting worse. She took 2 albuterol txt prior to calling EMS. EMS reports her lungs sounds were clear and NSR. She also reports A&OX4.

## 2013-12-30 NOTE — ED Notes (Signed)
Ct tech at bedside to take pt, pt sts "I don't even want to go". Pt informed it is important for the scan to be performed so the provider can make sure nothing dangerous is causing the pain. Pt agreed to go to scan.

## 2013-12-30 NOTE — ED Notes (Signed)
Pt returned from radiology.

## 2014-01-30 ENCOUNTER — Emergency Department (HOSPITAL_COMMUNITY): Payer: Medicare Other

## 2014-01-30 ENCOUNTER — Inpatient Hospital Stay (HOSPITAL_COMMUNITY)
Admission: EM | Admit: 2014-01-30 | Discharge: 2014-02-04 | DRG: 092 | Disposition: A | Discharge status: Home Health | Payer: Medicare Other | Attending: Internal Medicine | Admitting: Internal Medicine

## 2014-01-30 ENCOUNTER — Encounter (HOSPITAL_COMMUNITY): Payer: Self-pay | Admitting: Emergency Medicine

## 2014-01-30 DIAGNOSIS — R339 Retention of urine, unspecified: Secondary | ICD-10-CM | POA: Diagnosis present

## 2014-01-30 DIAGNOSIS — R200 Anesthesia of skin: Principal | ICD-10-CM | POA: Diagnosis present

## 2014-01-30 DIAGNOSIS — W19XXXD Unspecified fall, subsequent encounter: Secondary | ICD-10-CM

## 2014-01-30 DIAGNOSIS — M79604 Pain in right leg: Secondary | ICD-10-CM | POA: Diagnosis present

## 2014-01-30 DIAGNOSIS — G894 Chronic pain syndrome: Secondary | ICD-10-CM | POA: Diagnosis present

## 2014-01-30 DIAGNOSIS — Y92230 Patient room in hospital as the place of occurrence of the external cause: Secondary | ICD-10-CM

## 2014-01-30 DIAGNOSIS — M79606 Pain in leg, unspecified: Secondary | ICD-10-CM

## 2014-01-30 DIAGNOSIS — M79601 Pain in right arm: Secondary | ICD-10-CM

## 2014-01-30 DIAGNOSIS — F32A Depression, unspecified: Secondary | ICD-10-CM | POA: Diagnosis present

## 2014-01-30 DIAGNOSIS — I959 Hypotension, unspecified: Secondary | ICD-10-CM | POA: Diagnosis present

## 2014-01-30 DIAGNOSIS — Z87891 Personal history of nicotine dependence: Secondary | ICD-10-CM

## 2014-01-30 DIAGNOSIS — Z6836 Body mass index (BMI) 36.0-36.9, adult: Secondary | ICD-10-CM

## 2014-01-30 DIAGNOSIS — M549 Dorsalgia, unspecified: Secondary | ICD-10-CM | POA: Diagnosis present

## 2014-01-30 DIAGNOSIS — F4310 Post-traumatic stress disorder, unspecified: Secondary | ICD-10-CM | POA: Diagnosis present

## 2014-01-30 DIAGNOSIS — E66812 Obesity, class 2: Secondary | ICD-10-CM | POA: Diagnosis present

## 2014-01-30 DIAGNOSIS — F339 Major depressive disorder, recurrent, unspecified: Secondary | ICD-10-CM | POA: Diagnosis present

## 2014-01-30 DIAGNOSIS — D509 Iron deficiency anemia, unspecified: Secondary | ICD-10-CM | POA: Diagnosis present

## 2014-01-30 DIAGNOSIS — W010XXA Fall on same level from slipping, tripping and stumbling without subsequent striking against object, initial encounter: Secondary | ICD-10-CM | POA: Diagnosis present

## 2014-01-30 DIAGNOSIS — Z888 Allergy status to other drugs, medicaments and biological substances status: Secondary | ICD-10-CM

## 2014-01-30 DIAGNOSIS — G8929 Other chronic pain: Secondary | ICD-10-CM

## 2014-01-30 DIAGNOSIS — Z91041 Radiographic dye allergy status: Secondary | ICD-10-CM

## 2014-01-30 DIAGNOSIS — F410 Panic disorder [episodic paroxysmal anxiety] without agoraphobia: Secondary | ICD-10-CM | POA: Diagnosis present

## 2014-01-30 DIAGNOSIS — F419 Anxiety disorder, unspecified: Secondary | ICD-10-CM | POA: Diagnosis present

## 2014-01-30 DIAGNOSIS — E669 Obesity, unspecified: Secondary | ICD-10-CM

## 2014-01-30 DIAGNOSIS — K5909 Other constipation: Secondary | ICD-10-CM | POA: Diagnosis present

## 2014-01-30 DIAGNOSIS — Z8249 Family history of ischemic heart disease and other diseases of the circulatory system: Secondary | ICD-10-CM

## 2014-01-30 DIAGNOSIS — F411 Generalized anxiety disorder: Secondary | ICD-10-CM | POA: Diagnosis present

## 2014-01-30 DIAGNOSIS — F329 Major depressive disorder, single episode, unspecified: Secondary | ICD-10-CM | POA: Diagnosis present

## 2014-01-30 DIAGNOSIS — Z79891 Long term (current) use of opiate analgesic: Secondary | ICD-10-CM

## 2014-01-30 DIAGNOSIS — Z8041 Family history of malignant neoplasm of ovary: Secondary | ICD-10-CM

## 2014-01-30 DIAGNOSIS — Y92010 Kitchen of single-family (private) house as the place of occurrence of the external cause: Secondary | ICD-10-CM

## 2014-01-30 DIAGNOSIS — J449 Chronic obstructive pulmonary disease, unspecified: Secondary | ICD-10-CM | POA: Diagnosis present

## 2014-01-30 DIAGNOSIS — Z79899 Other long term (current) drug therapy: Secondary | ICD-10-CM

## 2014-01-30 DIAGNOSIS — Z833 Family history of diabetes mellitus: Secondary | ICD-10-CM

## 2014-01-30 DIAGNOSIS — T40605A Adverse effect of unspecified narcotics, initial encounter: Secondary | ICD-10-CM | POA: Diagnosis present

## 2014-01-30 DIAGNOSIS — W19XXXA Unspecified fall, initial encounter: Secondary | ICD-10-CM | POA: Diagnosis present

## 2014-01-30 DIAGNOSIS — M79609 Pain in unspecified limb: Secondary | ICD-10-CM

## 2014-01-30 DIAGNOSIS — K5090 Crohn's disease, unspecified, without complications: Secondary | ICD-10-CM | POA: Diagnosis present

## 2014-01-30 HISTORY — DX: Depression, unspecified: F32.A

## 2014-01-30 HISTORY — DX: Major depressive disorder, single episode, unspecified: F32.9

## 2014-01-30 LAB — CBC WITH DIFFERENTIAL/PLATELET
Basophils Absolute: 0 10*3/uL (ref 0.0–0.1)
Basophils Relative: 0 % (ref 0–1)
Eosinophils Absolute: 0.2 10*3/uL (ref 0.0–0.7)
Eosinophils Relative: 2 % (ref 0–5)
HCT: 35.8 % — ABNORMAL LOW (ref 36.0–46.0)
Hemoglobin: 11.4 g/dL — ABNORMAL LOW (ref 12.0–15.0)
Lymphocytes Relative: 40 % (ref 12–46)
Lymphs Abs: 3.9 10*3/uL (ref 0.7–4.0)
MCH: 27.7 pg (ref 26.0–34.0)
MCHC: 31.8 g/dL (ref 30.0–36.0)
MCV: 87.1 fL (ref 78.0–100.0)
Monocytes Absolute: 0.5 10*3/uL (ref 0.1–1.0)
Monocytes Relative: 6 % (ref 3–12)
Neutro Abs: 5.1 10*3/uL (ref 1.7–7.7)
Neutrophils Relative %: 52 % (ref 43–77)
Platelets: 310 10*3/uL (ref 150–400)
RBC: 4.11 MIL/uL (ref 3.87–5.11)
RDW: 14.2 % (ref 11.5–15.5)
WBC: 9.7 10*3/uL (ref 4.0–10.5)

## 2014-01-30 LAB — I-STAT CHEM 8, ED
BUN: 19 mg/dL (ref 6–23)
Calcium, Ion: 1.12 mmol/L (ref 1.12–1.23)
Chloride: 110 mEq/L (ref 96–112)
Creatinine, Ser: 0.7 mg/dL (ref 0.50–1.10)
Glucose, Bld: 78 mg/dL (ref 70–99)
HCT: 37 % (ref 36.0–46.0)
Hemoglobin: 12.6 g/dL (ref 12.0–15.0)
Potassium: 3.3 mEq/L — ABNORMAL LOW (ref 3.7–5.3)
Sodium: 144 mEq/L (ref 137–147)
TCO2: 22 mmol/L (ref 0–100)

## 2014-01-30 MED ORDER — SODIUM CHLORIDE 0.9 % IV BOLUS (SEPSIS)
1000.0000 mL | Freq: Once | INTRAVENOUS | Status: AC
  Administered 2014-01-30: 1000 mL via INTRAVENOUS

## 2014-01-30 MED ORDER — OXYCODONE-ACETAMINOPHEN 5-325 MG PO TABS
1.0000 | ORAL_TABLET | Freq: Once | ORAL | Status: AC
  Administered 2014-01-30: 1 via ORAL
  Filled 2014-01-30: qty 1

## 2014-01-30 MED ORDER — ONDANSETRON 8 MG PO TBDP
8.0000 mg | ORAL_TABLET | Freq: Once | ORAL | Status: AC
  Administered 2014-01-30: 8 mg via ORAL
  Filled 2014-01-30: qty 1

## 2014-01-30 MED ORDER — FENTANYL CITRATE 0.05 MG/ML IJ SOLN
100.0000 ug | Freq: Once | INTRAMUSCULAR | Status: AC
  Administered 2014-01-30: 100 ug via INTRAVENOUS
  Filled 2014-01-30: qty 2

## 2014-01-30 NOTE — ED Notes (Signed)
Bed: EH20 Expected date: 01/30/14 Expected time: 5:59 PM Means of arrival: Ambulance Comments: Fall

## 2014-01-30 NOTE — ED Notes (Addendum)
Pt reports that she was vomiting. No emesis noted in emesis bag. Sts she feels nauseous. Obtained order for zofran. Will hold PO pain meds until nausea is resolved. Pt's c-collar removed by Thayer Ohmhris, ED PA.

## 2014-01-30 NOTE — ED Provider Notes (Signed)
Patient lost her footing and fell in her home tonight landing flat on her back. She complains of pain at the midthoracic back and lumbar area pain radiates to right foot. She also complains of new numbness in her right lateral thigh. She denies incontinence. Patient walks with walker. States her normal blood pressure runs 80-90 systolic. On exam appears uncomfortable HEENT exam no facial asymmetry neck supple trachea midline no tenderness. Abdomen obese nontender pelvis stable nontender thoracic and lumbar spine nontender. Pelvis stable nontender. Neurologic Glasgow Coma Score 15 cranial nerves II through XII intact. Walks with assistance of 2 people with limp of right leg. DTRs symmetric bilaterally at knee jerk ankle jerk and biceps toes downward going bilaterally  Doug Sou, MD 01/30/14 2203

## 2014-01-30 NOTE — ED Notes (Signed)
Pt in from home by EMS. Hx chronic back pain and "nerve issues". Sts legs just gave out from under her while walking, Sts she fell backwards, denies hitting head. Denies neck pain. In on LSB and c-collar, board removed by this RN and assist x3. Pt reports pain from shoulder blades down thru legs. Shoulder blade pain is new.

## 2014-01-31 ENCOUNTER — Observation Stay (HOSPITAL_COMMUNITY): Payer: Medicare Other

## 2014-01-31 ENCOUNTER — Emergency Department (HOSPITAL_COMMUNITY): Payer: Medicare Other

## 2014-01-31 ENCOUNTER — Encounter (HOSPITAL_COMMUNITY): Payer: Self-pay | Admitting: Internal Medicine

## 2014-01-31 DIAGNOSIS — J449 Chronic obstructive pulmonary disease, unspecified: Secondary | ICD-10-CM

## 2014-01-31 DIAGNOSIS — F329 Major depressive disorder, single episode, unspecified: Secondary | ICD-10-CM

## 2014-01-31 DIAGNOSIS — M79606 Pain in leg, unspecified: Secondary | ICD-10-CM

## 2014-01-31 DIAGNOSIS — M79609 Pain in unspecified limb: Secondary | ICD-10-CM

## 2014-01-31 DIAGNOSIS — R208 Other disturbances of skin sensation: Secondary | ICD-10-CM

## 2014-01-31 DIAGNOSIS — R200 Anesthesia of skin: Secondary | ICD-10-CM

## 2014-01-31 DIAGNOSIS — E669 Obesity, unspecified: Secondary | ICD-10-CM

## 2014-01-31 DIAGNOSIS — M549 Dorsalgia, unspecified: Secondary | ICD-10-CM

## 2014-01-31 DIAGNOSIS — G8929 Other chronic pain: Secondary | ICD-10-CM

## 2014-01-31 DIAGNOSIS — E66812 Obesity, class 2: Secondary | ICD-10-CM | POA: Diagnosis present

## 2014-01-31 DIAGNOSIS — W19XXXA Unspecified fall, initial encounter: Secondary | ICD-10-CM | POA: Diagnosis present

## 2014-01-31 DIAGNOSIS — F32A Depression, unspecified: Secondary | ICD-10-CM | POA: Diagnosis present

## 2014-01-31 DIAGNOSIS — F419 Anxiety disorder, unspecified: Secondary | ICD-10-CM | POA: Diagnosis present

## 2014-01-31 HISTORY — DX: Pain in unspecified limb: M79.609

## 2014-01-31 LAB — CBC
HCT: 32.7 % — ABNORMAL LOW (ref 36.0–46.0)
HEMOGLOBIN: 10.5 g/dL — AB (ref 12.0–15.0)
MCH: 28 pg (ref 26.0–34.0)
MCHC: 32.1 g/dL (ref 30.0–36.0)
MCV: 87.2 fL (ref 78.0–100.0)
PLATELETS: 308 10*3/uL (ref 150–400)
RBC: 3.75 MIL/uL — ABNORMAL LOW (ref 3.87–5.11)
RDW: 14.3 % (ref 11.5–15.5)
WBC: 9.4 10*3/uL (ref 4.0–10.5)

## 2014-01-31 LAB — URINALYSIS, ROUTINE W REFLEX MICROSCOPIC
Bilirubin Urine: NEGATIVE
Glucose, UA: NEGATIVE mg/dL
Hgb urine dipstick: NEGATIVE
Ketones, ur: NEGATIVE mg/dL
Leukocytes, UA: NEGATIVE
Nitrite: NEGATIVE
Protein, ur: NEGATIVE mg/dL
Specific Gravity, Urine: 1.024 (ref 1.005–1.030)
Urobilinogen, UA: 0.2 mg/dL (ref 0.0–1.0)
pH: 6 (ref 5.0–8.0)

## 2014-01-31 LAB — BASIC METABOLIC PANEL
ANION GAP: 9 (ref 5–15)
BUN: 17 mg/dL (ref 6–23)
CO2: 24 mEq/L (ref 19–32)
Calcium: 8.1 mg/dL — ABNORMAL LOW (ref 8.4–10.5)
Chloride: 109 mEq/L (ref 96–112)
Creatinine, Ser: 0.67 mg/dL (ref 0.50–1.10)
Glucose, Bld: 120 mg/dL — ABNORMAL HIGH (ref 70–99)
POTASSIUM: 3.7 meq/L (ref 3.7–5.3)
Sodium: 142 mEq/L (ref 137–147)

## 2014-01-31 LAB — RAPID URINE DRUG SCREEN, HOSP PERFORMED
Amphetamines: NOT DETECTED
Barbiturates: NOT DETECTED
Benzodiazepines: POSITIVE — AB
Cocaine: NOT DETECTED
OPIATES: NOT DETECTED
Tetrahydrocannabinol: NOT DETECTED

## 2014-01-31 LAB — POC URINE PREG, ED: Preg Test, Ur: NEGATIVE

## 2014-01-31 MED ORDER — CLONAZEPAM 1 MG PO TABS
2.0000 mg | ORAL_TABLET | Freq: Three times a day (TID) | ORAL | Status: DC
  Administered 2014-01-31: 2 mg via ORAL
  Filled 2014-01-31: qty 4

## 2014-01-31 MED ORDER — ONDANSETRON HCL 4 MG/2ML IJ SOLN
4.0000 mg | Freq: Once | INTRAMUSCULAR | Status: AC
  Administered 2014-01-31: 4 mg via INTRAVENOUS
  Filled 2014-01-31: qty 2

## 2014-01-31 MED ORDER — ONDANSETRON HCL 4 MG/2ML IJ SOLN
4.0000 mg | Freq: Four times a day (QID) | INTRAMUSCULAR | Status: DC | PRN
  Administered 2014-01-31: 4 mg via INTRAVENOUS
  Filled 2014-01-31: qty 2

## 2014-01-31 MED ORDER — MORPHINE SULFATE ER 30 MG PO TBCR
60.0000 mg | EXTENDED_RELEASE_TABLET | Freq: Two times a day (BID) | ORAL | Status: DC
  Administered 2014-01-31 – 2014-02-04 (×8): 60 mg via ORAL
  Filled 2014-01-31 (×9): qty 2

## 2014-01-31 MED ORDER — SODIUM CHLORIDE 0.9 % IV BOLUS (SEPSIS)
500.0000 mL | Freq: Once | INTRAVENOUS | Status: AC
  Administered 2014-01-31: 07:00:00 via INTRAVENOUS

## 2014-01-31 MED ORDER — DICYCLOMINE HCL 20 MG PO TABS
20.0000 mg | ORAL_TABLET | Freq: Four times a day (QID) | ORAL | Status: DC | PRN
  Filled 2014-01-31: qty 1

## 2014-01-31 MED ORDER — HYDROMORPHONE HCL 1 MG/ML IJ SOLN
1.0000 mg | INTRAMUSCULAR | Status: DC | PRN
  Administered 2014-01-31: 1 mg via INTRAVENOUS
  Filled 2014-01-31 (×2): qty 1

## 2014-01-31 MED ORDER — TAMSULOSIN HCL 0.4 MG PO CAPS
0.4000 mg | ORAL_CAPSULE | Freq: Every day | ORAL | Status: DC
  Administered 2014-01-31 – 2014-02-03 (×3): 0.4 mg via ORAL
  Filled 2014-01-31 (×5): qty 1

## 2014-01-31 MED ORDER — ALBUTEROL SULFATE (2.5 MG/3ML) 0.083% IN NEBU: 2.5000 mg | INHALATION_SOLUTION | Freq: Four times a day (QID) | RESPIRATORY_TRACT | Status: DC | PRN

## 2014-01-31 MED ORDER — HYDROMORPHONE HCL 1 MG/ML IJ SOLN
1.0000 mg | Freq: Once | INTRAMUSCULAR | Status: AC
  Administered 2014-01-31: 1 mg via INTRAVENOUS
  Filled 2014-01-31: qty 1

## 2014-01-31 MED ORDER — CLONAZEPAM 1 MG PO TABS
2.0000 mg | ORAL_TABLET | Freq: Three times a day (TID) | ORAL | Status: DC | PRN
  Administered 2014-02-01: 2 mg via ORAL
  Filled 2014-01-31 (×2): qty 4

## 2014-01-31 MED ORDER — ONDANSETRON HCL 4 MG PO TABS: 4.0000 mg | ORAL_TABLET | Freq: Four times a day (QID) | ORAL | Status: DC | PRN

## 2014-01-31 MED ORDER — SENNA 8.6 MG PO TABS
2.0000 | ORAL_TABLET | Freq: Every day | ORAL | Status: DC
  Administered 2014-01-31 – 2014-02-03 (×4): 17.2 mg via ORAL
  Filled 2014-01-31 (×4): qty 2

## 2014-01-31 MED ORDER — HYDROXYZINE HCL 50 MG PO TABS
100.0000 mg | ORAL_TABLET | Freq: Every day | ORAL | Status: DC
  Filled 2014-01-31 (×2): qty 2

## 2014-01-31 MED ORDER — ACETAMINOPHEN 650 MG RE SUPP: 650.0000 mg | Freq: Four times a day (QID) | RECTAL | Status: DC | PRN

## 2014-01-31 MED ORDER — ACETAMINOPHEN 325 MG PO TABS
650.0000 mg | ORAL_TABLET | Freq: Four times a day (QID) | ORAL | Status: DC | PRN
  Administered 2014-01-31: 650 mg via ORAL
  Filled 2014-01-31: qty 2

## 2014-01-31 MED ORDER — DOCUSATE SODIUM 100 MG PO CAPS
100.0000 mg | ORAL_CAPSULE | Freq: Two times a day (BID) | ORAL | Status: DC
  Administered 2014-01-31 – 2014-02-04 (×8): 100 mg via ORAL
  Filled 2014-01-31 (×10): qty 1

## 2014-01-31 MED ORDER — POLYETHYLENE GLYCOL 3350 17 G PO PACK
17.0000 g | PACK | Freq: Every day | ORAL | Status: DC
  Administered 2014-01-31 – 2014-02-04 (×5): 17 g via ORAL
  Filled 2014-01-31 (×5): qty 1

## 2014-01-31 MED ORDER — HYDROMORPHONE HCL 1 MG/ML IJ SOLN
1.0000 mg | INTRAMUSCULAR | Status: DC | PRN
  Administered 2014-01-31 – 2014-02-02 (×11): 1 mg via INTRAVENOUS
  Filled 2014-01-31 (×11): qty 1

## 2014-01-31 MED ORDER — FLUOXETINE HCL 20 MG PO CAPS
20.0000 mg | ORAL_CAPSULE | Freq: Every day | ORAL | Status: DC
  Administered 2014-01-31 – 2014-02-03 (×4): 20 mg via ORAL
  Filled 2014-01-31 (×6): qty 1

## 2014-01-31 MED ORDER — ENOXAPARIN SODIUM 40 MG/0.4ML ~~LOC~~ SOLN
40.0000 mg | SUBCUTANEOUS | Status: DC
  Administered 2014-01-31 – 2014-02-04 (×5): 40 mg via SUBCUTANEOUS
  Filled 2014-01-31 (×5): qty 0.4

## 2014-01-31 MED ORDER — BISACODYL 10 MG RE SUPP: 10.0000 mg | Freq: Every day | RECTAL | Status: DC | PRN

## 2014-01-31 MED ORDER — OXYCODONE HCL 5 MG PO TABS
10.0000 mg | ORAL_TABLET | ORAL | Status: DC | PRN
  Administered 2014-01-31 – 2014-02-04 (×15): 10 mg via ORAL
  Filled 2014-01-31 (×15): qty 2

## 2014-01-31 MED ORDER — OXYCODONE HCL 5 MG PO TABS
10.0000 mg | ORAL_TABLET | Freq: Four times a day (QID) | ORAL | Status: DC | PRN
  Administered 2014-01-31 (×2): 10 mg via ORAL
  Filled 2014-01-31 (×2): qty 2

## 2014-01-31 MED ORDER — POLYETHYLENE GLYCOL 3350 17 G PO PACK
17.0000 g | PACK | Freq: Every day | ORAL | Status: DC | PRN
  Filled 2014-01-31: qty 1

## 2014-01-31 MED ORDER — ALBUTEROL SULFATE (2.5 MG/3ML) 0.083% IN NEBU
2.5000 mg | INHALATION_SOLUTION | Freq: Two times a day (BID) | RESPIRATORY_TRACT | Status: DC | PRN
Start: 2014-01-31 — End: 2014-01-31

## 2014-01-31 MED ORDER — SODIUM CHLORIDE 0.9 % IV SOLN
INTRAVENOUS | Status: AC
  Administered 2014-01-31 (×2): via INTRAVENOUS

## 2014-01-31 NOTE — Progress Notes (Signed)
[facility] PROGRESS NOTE  Denise Ellis ZOX:[id number] DOB: [birth year] DOA: 01/30/2014 PCP: Marin Comment, FNP  Assessment/Plan  Fall with right leg numbness.  Pt reports she has some baseline numbness of the right leg, but that now she cannot feel anything on the right leg.  Initially she had a hard time telling me how high the numbness went, but later stated it was to the level of the gluteal crease.  Endorsed saddle anesthesia.  Objectively, however, she has intact sensation and no hemineglect.  She has chronic RUE and RLE 4/5 strength which is unchanged per her report. CT demonstrated no fracture or subluxation but a mild bulging disk at L4-5 and L5-S1 on the right with mild foraminal narrowing.  At L5-S1 the nerve root may be compressed.  Her findings do not explain her reported saddle anesthesia or her urinary retention.  Her falls are probably due to her high dose sedating medications.   -  MRI lumbar spine -  Cancel MRI thoracic spine since CT was unremarkable -  Case discussed with Dr. Lovell Sheehan, NSGY who reviewed images and recommended f/u MRI -  PT recommending rolling walker and HH therapies  Chronic pain, followed by pain clinic in [place].  Patient has been on long-term pain management, however, she was unable to accurately report her medications.   -  I called her PCP's office which is closed on [address], no answer -  Her primary pharmacy is closed -  Spoke with on call person from her [facility] pain clinic office:  Primary is [name], [phone].  She is on MS contin 60mg  BID and oxycodone 10mg  four times daily -  Will continue her home regimen  Acute urinary retention.  During her previous ER visits, she has had some difficulty urinating also.  Suspect this may be due to her high dose narcotics. -  Foley placed with of urine removed -  Start flomax  -  Voiding trial in AM and if unsuccessful, may need chronic indwelling foley with urology follow  up  Constipation, likely due to narcotics -  Start colace, senna, and schedule miralax  -  Bisacodyl prn  Hypotension, improving with IVF -  Check cortisol level in AM -  Continue IVF  Depression and anxiety, probably contributing greatly to her pain and generalized illness/weakness.  She is not on SSRI but reports she is on high dose clonazepam.   -  Will start fluoxetine (cymbalta can cause orthostatic hypotension or syncope) -  Change clonazepam to 2mg  TID prn anxiety instead of scheduled -  Pharmacist to verify her medication list tomorrow when pharmacy opens  I question her reported diagnosis of COPD.  She may have some asthma.   -  Continue prn albuterol  -  Outpatient PFTs  Mild Crohn's disease.  The patient has been seen in the ER twice in the last two months prior to this admission and has gotten two CT abd/pelvis.  The first showed mild proctitis and the second was normal, although at the time, the patient reported she was supposed to undergo a major resection of bowel Stable at this time. Inhalers as needed.   Normocytic anemia -  Iron studies, b12, folate, TSH, occult stool  Diet:  Healthy heart Access:  PIV IVF:  yes Proph:  lovenox  Code Status: full Family Communication: patient and her husband Disposition Plan:  Possibly home tomorrow   Consultants:  Spoke with Dr. Lovell Sheehan, NSGY by phone  Procedures:  CT thoracic and  lumbar spine  MRI lumbar spine  Antibiotics:  none   HPI/Subjective:  States she has complete numbness of the right leg with saddle anesthesia to the buttock.  Chronic weakness of RUE, RLE.  Back pain which was relieved somewhat by foley placement.  Objective: Filed Vitals:   01/31/14 0238 01/31/14 0623 01/31/14 0757 01/31/14 1349  BP: 95/60 84/48 101/53 95/56  Pulse: 62 74 66 68  Temp: 97.7 F (36.5 C) 98 F (36.7 C) 98 F (36.7 C) 97.8 F (36.6 C)  TempSrc: Oral Oral Oral Oral  Resp: 20 16 16 16   SpO2: 94% 97% 97% 100%     Intake/Output Summary (Last 24 hours) at 01/31/14 1704 Last data filed at 01/31/14 1000  Gross per 24 hour  Intake    660 ml  Output    150 ml  Net    510 ml   There were no vitals filed for this visit.  Exam:   General:  WF, No acute distress  HEENT:  NCAT, MMM  Cardiovascular:  RRR, nl S1, S2 no mrg, 2+ pulses, warm extremities  Respiratory:  CTAB, no increased WOB  Abdomen:   NABS, soft, ND, diffusely TTP without rebound or guarding  MSK:   Normal tone and bulk, no LEE  Neuro:  CN II-XII grossly intact.  4/5 RUE and RLE strength, 5/5 LUE and LLE.  Sensation INTACT bilaterally.  No hemineglect.  No dysmetria.    Data Reviewed: Basic Metabolic Panel:  Recent Labs Lab 01/30/14 2222 01/31/14 0555  NA 144 142  K 3.3* 3.7  CL 110 109  CO2  --  24  GLUCOSE 78 120*  BUN 19 17  CREATININE 0.70 0.67  CALCIUM  --  8.1*   Liver Function Tests: No results found for this basename: AST, ALT, ALKPHOS, BILITOT, PROT, ALBUMIN,  in the last 168 hours No results found for this basename: LIPASE, AMYLASE,  in the last 168 hours No results found for this basename: AMMONIA,  in the last 168 hours CBC:  Recent Labs Lab 01/30/14 2211 01/30/14 2222 01/31/14 0555  WBC 9.7  --  9.4  NEUTROABS 5.1  --   --   HGB 11.4* 12.6 10.5*  HCT 35.8* 37.0 32.7*  MCV 87.1  --  87.2  PLT 310  --  308   Cardiac Enzymes: No results found for this basename: CKTOTAL, CKMB, CKMBINDEX, TROPONINI,  in the last 168 hours BNP (last 3 results) No results found for this basename: PROBNP,  in the last 8760 hours CBG: No results found for this basename: GLUCAP,  in the last 168 hours  No results found for this or any previous visit (from the past 240 hour(s)).   Studies: Dg Thoracic Spine 2 View  01/30/2014   CLINICAL DATA:  Fall, back pain  EXAM: THORACIC SPINE - 2 VIEW  COMPARISON:  12/30/2013 and CT chest 12/15/2012  FINDINGS: Three views of thoracic spine submitted. No acute fracture or  subluxation. Alignment, disc spaces and vertebral heights are preserved.  IMPRESSION: Negative.   Electronically Signed   By: Natasha Mead M.D.   On: 01/30/2014 19:35   Dg Lumbar Spine Complete  01/31/2014   CLINICAL DATA:  Diffuse lower back pain after falling backwards while walking. Patient's legs gave out. Did not hit head. Initial encounter.  EXAM: LUMBAR SPINE - COMPLETE 4+ VIEW  COMPARISON:  Lumbar spine radiographs performed 10/01/2013  FINDINGS: There is no evidence of fracture or subluxation. Vertebral bodies demonstrate  normal height and alignment. Intervertebral disc spaces are preserved. The visualized neural foramina are grossly unremarkable in appearance.  The visualized bowel gas pattern is unremarkable in appearance; air and stool are noted within the colon. The sacroiliac joints are within normal limits.  IMPRESSION: No evidence of fracture or subluxation along the lumbar spine.   Electronically Signed   By: Roanna Raider M.D.   On: 01/31/2014 00:13   Ct Thoracic Spine Wo Contrast  01/31/2014   CLINICAL DATA:  Lost balance and fell backward while walking in kitchen. Landed on back and buttocks, with severe diffuse back pain and right leg numbness. Cannot ambulate. Initial encounter.  EXAM: CT THORACIC AND LUMBAR SPINE WITHOUT CONTRAST  TECHNIQUE: Multidetector CT imaging of the thoracic and lumbar spine was performed without contrast. Multiplanar CT image reconstructions were also generated.  COMPARISON:  Lumbar and thoracic spine radiographs performed 01/30/2014  FINDINGS: CT THORACIC SPINE FINDINGS  There is no evidence of fracture or subluxation along the thoracic spine. Vertebral bodies demonstrate normal height and alignment. Intervertebral disc spaces are preserved. The bony foramina are unremarkable in appearance bilaterally. There is no evidence of foraminal narrowing.  The thyroid gland is unremarkable. The visualized portions of the mediastinum are within normal limits. Minimal  bilateral atelectasis is noted.  The paraspinal musculature is unremarkable in appearance.  CT LUMBAR SPINE FINDINGS  There is no evidence of fracture or subluxation along the lumbar spine. Vertebral bodies demonstrate normal height and alignment. Intervertebral disc spaces are preserved. The bony foramina are unremarkable in appearance bilaterally.  There may be mild bulging of the disc at L4-L5 and L5-S1 on the right side, with mild associated foraminal narrowing. At L5-S1, this may impress on the exiting nerve root; would correlate with the level of the patient's symptoms. Minimal facet disease is noted at the lower lumbar spine.  Visualized bowel structures are unremarkable in appearance. Postoperative change is noted about the cecum. The visualized portions of the kidneys are unremarkable. No vascular calcification is seen.  The paraspinal musculature is unremarkable in appearance.  IMPRESSION: 1. No evidence of fracture or subluxation along the thoracic or lumbar spine. 2. There may be mild bulging of the disc at L4-L5 and L5-S1 on the right side, with mild associated foraminal narrowing. At L5-S1, this may impress on the exiting nerve root. Would correlate with the level of the patient's symptoms.   Electronically Signed   By: Roanna Raider M.D.   On: 01/31/2014 01:46   Ct Lumbar Spine Wo Contrast  01/31/2014   CLINICAL DATA:  Lost balance and fell backward while walking in kitchen. Landed on back and buttocks, with severe diffuse back pain and right leg numbness. Cannot ambulate. Initial encounter.  EXAM: CT THORACIC AND LUMBAR SPINE WITHOUT CONTRAST  TECHNIQUE: Multidetector CT imaging of the thoracic and lumbar spine was performed without contrast. Multiplanar CT image reconstructions were also generated.  COMPARISON:  Lumbar and thoracic spine radiographs performed 01/30/2014  FINDINGS: CT THORACIC SPINE FINDINGS  There is no evidence of fracture or subluxation along the thoracic spine. Vertebral  bodies demonstrate normal height and alignment. Intervertebral disc spaces are preserved. The bony foramina are unremarkable in appearance bilaterally. There is no evidence of foraminal narrowing.  The thyroid gland is unremarkable. The visualized portions of the mediastinum are within normal limits. Minimal bilateral atelectasis is noted.  The paraspinal musculature is unremarkable in appearance.  CT LUMBAR SPINE FINDINGS  There is no evidence of fracture or subluxation along the  lumbar spine. Vertebral bodies demonstrate normal height and alignment. Intervertebral disc spaces are preserved. The bony foramina are unremarkable in appearance bilaterally.  There may be mild bulging of the disc at L4-L5 and L5-S1 on the right side, with mild associated foraminal narrowing. At L5-S1, this may impress on the exiting nerve root; would correlate with the level of the patient's symptoms. Minimal facet disease is noted at the lower lumbar spine.  Visualized bowel structures are unremarkable in appearance. Postoperative change is noted about the cecum. The visualized portions of the kidneys are unremarkable. No vascular calcification is seen.  The paraspinal musculature is unremarkable in appearance.  IMPRESSION: 1. No evidence of fracture or subluxation along the thoracic or lumbar spine. 2. There may be mild bulging of the disc at L4-L5 and L5-S1 on the right side, with mild associated foraminal narrowing. At L5-S1, this may impress on the exiting nerve root. Would correlate with the level of the patient's symptoms.   Electronically Signed   By: Roanna RaiderJeffery  Chang M.D.   On: 01/31/2014 01:46   Mr Lumbar Spine Wo Contrast  01/31/2014   CLINICAL DATA:  Low back and right leg pain and numbness. Patient fell yesterday in her kitchen.  EXAM: MRI LUMBAR SPINE WITHOUT CONTRAST  TECHNIQUE: Multiplanar, multisequence MR imaging of the lumbar spine was performed. No intravenous contrast was administered.  COMPARISON:  CT scan  01/31/2014  FINDINGS: Normal alignment of the lumbar vertebral bodies. They demonstrate normal marrow signal. No acute fracture or bone lesion. The last full intervertebral disc space is labeled L5-S1 and the conus medullaris terminates at L2. There is small disc space at S1-2. The facets are normally aligned. No pars defects. No significant paraspinal or retroperitoneal findings are demonstrated.  L1-2:  No significant findings.  L2-3:  No significant findings.  L3-4:  No significant findings.  L4-5: Right foraminal/extra foraminal disc protrusion comes close to contacting and could irritate the right L4 nerve root. No spinal or lateral recess stenosis.  L5-S1: Shallow right foraminal disc protrusion comes close to contacting the right L5 nerve root and certainly could irritated. No spinal stenosis. Mild facet disease.  IMPRESSION: Right foraminal disc protrusions at L4-5 and L5-S1.  No acute bony findings.  The conus medullaris terminates at the bottom of L2.   Electronically Signed   By: Loralie ChampagneMark  Gallerani M.D.   On: 01/31/2014 16:18    Scheduled Meds: . docusate sodium  100 mg Oral BID  . enoxaparin (LOVENOX) injection  40 mg Subcutaneous Q24H  . FLUoxetine  20 mg Oral QHS  . hydrOXYzine  100 mg Oral QHS  . morphine  60 mg Oral Q12H  . polyethylene glycol  17 g Oral Daily  . senna  2 tablet Oral QHS  . tamsulosin  0.4 mg Oral QPC supper   Continuous Infusions: . sodium chloride 75 mL/hr at 01/31/14 1243    Principal Problem:   Fall Active Problems:   COPD (chronic obstructive pulmonary disease)   Chronic leg pain   Chronic back pain   Depression   Anxiety   Right leg numbness   Obesity    Time spent: 30 min    Alonte Wulff  [facility] Pager [phone]. If 7PM-7AM, please contact night-coverage at [email or web address], password [email or web address] 01/31/2014, 5:04 PM  LOS: 1 day

## 2014-01-31 NOTE — Progress Notes (Signed)
Attempted to insert foley catheter. Both myself and another nurse attempted without success.

## 2014-01-31 NOTE — Progress Notes (Signed)
UR completed 

## 2014-01-31 NOTE — ED Provider Notes (Signed)
CSN: [id number]     Arrival date & time 01/30/14  1807 History   First MD Initiated Contact with Patient 01/30/14 1810     Chief Complaint  Patient presents with  . Fall  . Back Pain     (Consider location/radiation/quality/duration/timing/severity/associated sxs/prior Treatment) HPI Patient presents to the emergency department following a fall that occurred just prior to arrival.  Patient, states his chronic back pain with leg issues and uses a walker for these.  Patient, states she was attempting to walk.  When she stumbled and fell backwards, hitting her lower to mid back.  She is complaining of mid back pain.  The patient, states, that her low back pain, is the same as prior.  The patient denies chest pain, shortness of breath, headache, blurred vision, weakness, lightheadedness dizziness, fever, abdominal pain, nausea, vomiting, diarrhea, rash, dysuria, incontinence, or syncope.  Patient, states she did not take any medications prior to arrival.  Palpation makes the pain, worse Past Medical History  Diagnosis Date  . Crohn's disease   . Chronic back pain   . COPD (chronic obstructive pulmonary disease)   . DDD (degenerative disc disease)   . Depressed   . Anxiety   . Chronic leg pain     right leg "I don't have much feeling in it"   Past Surgical History  Procedure Laterality Date  . Appendectomy     No family history on file. History  Substance Use Topics  . Smoking status: Current Some Day Smoker -- 0.20 packs/day for 18 years    Types: Cigarettes  . Smokeless tobacco: Never Used  . Alcohol Use: Not on file   OB History   Grav Para Term Preterm Abortions TAB SAB Ect Mult Living                 Review of Systems All other systems negative except as documented in the HPI. All pertinent positives and negatives as reviewed in the HPI.   Allergies  Gabapentin and Gadolinium derivatives  Home Medications   Prior to Admission medications   Medication Sig Start Date  End Date Taking? Authorizing Provider  albuterol (PROVENTIL) (2.5 MG/3ML) 0.083% nebulizer solution Take 2.5 mg by nebulization 2 (two) times daily as needed for wheezing or shortness of breath.    Historical Provider, MD  albuterol (PROVENTIL) (2.5 MG/3ML) 0.083% nebulizer solution Take 2.5 mg by nebulization every 6 (six) hours as needed for wheezing or shortness of breath.    Historical Provider, MD  cephALEXin (KEFLEX) 500 MG capsule Take 1 capsule (500 mg total) by mouth 3 (three) times daily. 12/01/13   Courtney A Forcucci, PA-C  clonazePAM (KLONOPIN) 2 MG tablet Take 2 mg by mouth 3 (three) times daily.    Historical Provider, MD  clonazePAM (KLONOPIN) 2 MG tablet Take 2 mg by mouth 3 (three) times daily.    Historical Provider, MD  dicyclomine (BENTYL) 20 MG tablet Take 20 mg by mouth 4 (four) times daily as needed for spasms.    Historical Provider, MD  hydrOXYzine (ATARAX/VISTARIL) 50 MG tablet Take 100 mg by mouth at bedtime.    Historical Provider, MD  ibuprofen (ADVIL,MOTRIN) 200 MG tablet Take 800 mg by mouth at bedtime.    Historical Provider, MD  loperamide (IMODIUM) 2 MG capsule Take 1 capsule (2 mg total) by mouth 4 (four) times daily as needed for diarrhea or loose stools. 12/01/13   Courtney A Forcucci, PA-C  morphine (MS CONTIN) 30 MG 12 hr  tablet Take 30 mg by mouth every 12 (twelve) hours.    Historical Provider, MD  Oxycodone HCl 10 MG TABS Take 10 mg by mouth 4 (four) times daily.    Historical Provider, MD  Oxycodone HCl 10 MG TABS Take 10 mg by mouth 4 (four) times daily.    Historical Provider, MD  OxyCODONE HCl ER (OXYCONTIN) 30 MG T12A Take 30 mg by mouth 2 (two) times daily.    Historical Provider, MD  promethazine (PHENERGAN) 25 MG suppository Place 1 suppository (25 mg total) rectally every 6 (six) hours as needed for nausea or vomiting. 12/01/13   Courtney A Forcucci, PA-C   BP 106/61  Pulse 68  Temp(Src) 97.7 F (36.5 C) (Oral)  Resp 16  SpO2 98%  LMP  01/20/2014 Physical Exam  Nursing note and vitals reviewed. Constitutional: She appears well-developed and well-nourished. No distress.  HENT:  Head: Normocephalic and atraumatic.  Mouth/Throat: Oropharynx is clear and moist.  Cardiovascular: Normal rate, regular rhythm and normal heart sounds.  Exam reveals no gallop and no friction rub.   No murmur heard. Musculoskeletal:       Thoracic back: She exhibits tenderness and pain. She exhibits normal range of motion, no swelling, no deformity and no spasm.       Lumbar back: She exhibits tenderness and pain. She exhibits normal range of motion, no bony tenderness, no deformity, no laceration and no spasm.  Neurological: She is alert. She has normal strength. She exhibits normal muscle tone. Coordination normal. GCS eye subscore is 4. GCS verbal subscore is 5. GCS motor subscore is 6.  Patient states she is unable to ambulate due to to the fact that her right leg feels numb.  Patient can ambulate with assistance    ED Course  Procedures (including critical care time) Labs Review Labs Reviewed  CBC WITH DIFFERENTIAL - Abnormal; Notable for the following:    Hemoglobin 11.4 (*)    HCT 35.8 (*)    All other components within normal limits  I-STAT CHEM 8, ED - Abnormal; Notable for the following:    Potassium 3.3 (*)    All other components within normal limits  URINALYSIS, ROUTINE W REFLEX MICROSCOPIC    Imaging Review Dg Thoracic Spine 2 View  01/30/2014   CLINICAL DATA:  Fall, back pain  EXAM: THORACIC SPINE - 2 VIEW  COMPARISON:  12/30/2013 and CT chest 12/15/2012  FINDINGS: Three views of thoracic spine submitted. No acute fracture or subluxation. Alignment, disc spaces and vertebral heights are preserved.  IMPRESSION: Negative.   Electronically Signed   By: Natasha Mead M.D.   On: 01/30/2014 19:35   Dg Lumbar Spine Complete  01/31/2014   CLINICAL DATA:  Diffuse lower back pain after falling backwards while walking. Patient's legs  gave out. Did not hit head. Initial encounter.  EXAM: LUMBAR SPINE - COMPLETE 4+ VIEW  COMPARISON:  Lumbar spine radiographs performed 10/01/2013  FINDINGS: There is no evidence of fracture or subluxation. Vertebral bodies demonstrate normal height and alignment. Intervertebral disc spaces are preserved. The visualized neural foramina are grossly unremarkable in appearance.  The visualized bowel gas pattern is unremarkable in appearance; air and stool are noted within the colon. The sacroiliac joints are within normal limits.  IMPRESSION: No evidence of fracture or subluxation along the lumbar spine.   Electronically Signed   By: Roanna Raider M.D.   On: 01/31/2014 00:13    Patient be admitted to the hospital for further evaluation and  care of her pain, and difficulty ambulating patient is advised of plan and all questions.  Richard spoke with the, [facility], for admission.    Carlyle Dollyhristopher W Rome Echavarria, PA-C 02/01/14 [phone]

## 2014-01-31 NOTE — H&P (Signed)
Patient's PCP: Marin Comment, FNP  Chief Complaint: Fall and Right leg numbess  History of Present Illness: Denise Ellis is a 33 y.o. Caucasian female with history of chronic back pain (indicates that she has impinged nerves and disc problems in the past), degenerative disc disease, depression, anxiety, and chronic right leg pain who presents with the above complaints.  Patient indicates that 2 hours prior to presenting to the ER while she was walking to the kitchen she lost her balance and fell backwards and landed on her buttock and her back.  She had severe pain in her back and numbness in her right leg.  She was not able to get up.  As a result she presented to the emergency department for further evaluation.  X-rays of thoracic and lumbar spine were negative for any fractures.  Attempt was made to ambulate the patient in the emergency department, however patient could not ambulate.  She was also complaining of persistent right leg numbness.  As a result hospitalist service was consulted to admit the patient for consideration of an MRI in the morning.  CT of her lumbar and thoracic spine is pending.  She has not had any recent fevers, chills, chest pain, shortness of breath, abdominal pain, diarrhea, headaches or vision changes.  Of note she recently had nausea and vomiting and presented to [facility] on 01/26/2014.  Also of note, due to her chronic back pain she indicates that she has difficulty in her right leg anyway and uses a cane to ambulate.  Since the fall, patient has been complaining about numbness around her tailbone, but no bowel or bladder incontinence.  Review of Systems: All systems reviewed with the patient and positive as per history of present illness, otherwise all other systems are negative.  Past Medical History  Diagnosis Date  . Crohn's disease   . Chronic back pain   . COPD (chronic obstructive pulmonary disease)   . DDD (degenerative disc disease)   . Depression    . Anxiety   . Chronic leg pain     right leg "I don't have much feeling in it"   Past Surgical History  Procedure Laterality Date  . Appendectomy     Family History  Problem Relation Age of Onset  . Ovarian cancer Mother   . Heart disease Father   . Diabetes Mellitus I Father    History   Social History  . Marital Status: Married    Spouse Name: N/A    Number of Children: N/A  . Years of Education: N/A   Occupational History  . Not on file.   Social History Main Topics  . Smoking status: Former Smoker -- 0.20 packs/day for 18 years    Types: Cigarettes    Quit date: 01/31/2009  . Smokeless tobacco: Never Used  . Alcohol Use: No  . Drug Use: Not on file  . Sexual Activity: Not on file   Other Topics Concern  . Not on file   Social History Narrative   ** Merged History Encounter **       Allergies: Gabapentin and Gadolinium derivatives  Home Meds: Prior to Admission medications   Medication Sig Start Date End Date Taking? Authorizing Provider  albuterol (PROVENTIL) (2.5 MG/3ML) 0.083% nebulizer solution Take 2.5 mg by nebulization 2 (two) times daily as needed for wheezing or shortness of breath.    Historical Provider, MD  albuterol (PROVENTIL) (2.5 MG/3ML) 0.083% nebulizer solution Take 2.5 mg by nebulization every 6 (six)  hours as needed for wheezing or shortness of breath.    Historical Provider, MD  cephALEXin (KEFLEX) 500 MG capsule Take 1 capsule (500 mg total) by mouth 3 (three) times daily. 12/01/13   Courtney A Forcucci, PA-C  clonazePAM (KLONOPIN) 2 MG tablet Take 2 mg by mouth 3 (three) times daily.    Historical Provider, MD  clonazePAM (KLONOPIN) 2 MG tablet Take 2 mg by mouth 3 (three) times daily.    Historical Provider, MD  dicyclomine (BENTYL) 20 MG tablet Take 20 mg by mouth 4 (four) times daily as needed for spasms.    Historical Provider, MD  hydrOXYzine (ATARAX/VISTARIL) 50 MG tablet Take 100 mg by mouth at bedtime.    Historical Provider, MD   ibuprofen (ADVIL,MOTRIN) 200 MG tablet Take 800 mg by mouth at bedtime.    Historical Provider, MD  loperamide (IMODIUM) 2 MG capsule Take 1 capsule (2 mg total) by mouth 4 (four) times daily as needed for diarrhea or loose stools. 12/01/13   Courtney A Forcucci, PA-C  morphine (MS CONTIN) 30 MG 12 hr tablet Take 30 mg by mouth every 12 (twelve) hours.    Historical Provider, MD  Oxycodone HCl 10 MG TABS Take 10 mg by mouth 4 (four) times daily.    Historical Provider, MD  Oxycodone HCl 10 MG TABS Take 10 mg by mouth 4 (four) times daily.    Historical Provider, MD  OxyCODONE HCl ER (OXYCONTIN) 30 MG T12A Take 30 mg by mouth 2 (two) times daily.    Historical Provider, MD  promethazine (PHENERGAN) 25 MG suppository Place 1 suppository (25 mg total) rectally every 6 (six) hours as needed for nausea or vomiting. 12/01/13   Lily Peerourtney A Forcucci, PA-C    Physical Exam: Blood pressure 106/61, pulse 68, temperature 97.7 F (36.5 C), temperature source Oral, resp. rate 16, last menstrual period 01/20/2014, SpO2 98.00%. General: Awake, Oriented x3, No acute distress. HEENT: EOMI, Moist mucous membranes Neck: Supple CV: S1 and S2 Lungs: Clear to ascultation bilaterally Abdomen: Obese, soft, Nontender, Nondistended, +bowel sounds. Ext: Good pulses. Trace edema. No clubbing or cyanosis noted. Neuro: Cranial Nerves II-XII grossly intact. Has 5/5 motor strength in left upper and lower extremities, 4+ motor strength in right lower extremity.  5/5 motor strength in right upper extremity.  Lab results:  Recent Labs  01/30/14 2222  NA 144  K 3.3*  CL 110  GLUCOSE 78  BUN 19  CREATININE 0.70   No results found for this basename: AST, ALT, ALKPHOS, BILITOT, PROT, ALBUMIN,  in the last 72 hours No results found for this basename: LIPASE, AMYLASE,  in the last 72 hours  Recent Labs  01/30/14 2211 01/30/14 2222  WBC 9.7  --   NEUTROABS 5.1  --   HGB 11.4* 12.6  HCT 35.8* 37.0  MCV 87.1  --    PLT 310  --    No results found for this basename: CKTOTAL, CKMB, CKMBINDEX, TROPONINI,  in the last 72 hours No components found with this basename: POCBNP,  No results found for this basename: DDIMER,  in the last 72 hours No results found for this basename: HGBA1C,  in the last 72 hours No results found for this basename: CHOL, HDL, LDLCALC, TRIG, CHOLHDL, LDLDIRECT,  in the last 72 hours No results found for this basename: TSH, T4TOTAL, FREET3, T3FREE, THYROIDAB,  in the last 72 hours No results found for this basename: VITAMINB12, FOLATE, FERRITIN, TIBC, IRON, RETICCTPCT,  in the last  72 hours Imaging results:  Dg Thoracic Spine 2 View  01/30/2014   CLINICAL DATA:  Fall, back pain  EXAM: THORACIC SPINE - 2 VIEW  COMPARISON:  12/30/2013 and CT chest 12/15/2012  FINDINGS: Three views of thoracic spine submitted. No acute fracture or subluxation. Alignment, disc spaces and vertebral heights are preserved.  IMPRESSION: Negative.   Electronically Signed   By: Natasha Mead M.D.   On: 01/30/2014 19:35   Dg Lumbar Spine Complete  01/31/2014   CLINICAL DATA:  Diffuse lower back pain after falling backwards while walking. Patient's legs gave out. Did not hit head. Initial encounter.  EXAM: LUMBAR SPINE - COMPLETE 4+ VIEW  COMPARISON:  Lumbar spine radiographs performed 10/01/2013  FINDINGS: There is no evidence of fracture or subluxation. Vertebral bodies demonstrate normal height and alignment. Intervertebral disc spaces are preserved. The visualized neural foramina are grossly unremarkable in appearance.  The visualized bowel gas pattern is unremarkable in appearance; air and stool are noted within the colon. The sacroiliac joints are within normal limits.  IMPRESSION: No evidence of fracture or subluxation along the lumbar spine.   Electronically Signed   By: Roanna Raider M.D.   On: 01/31/2014 00:13   Assessment & Plan by Problem: Fall/right leg numbness Request CT of her thoracic and lumbar  spine to rule out any gross abnormalities that requires immediate surgical intervention.  If patient's symptoms persist, request MRI of her lumbar and thoracic spine in the morning.  Further clinical course depending on MRI findings.  Also will request physical therapy evaluation.  Pain management with IV pain medications.  If CT or MRI shows findings concerning, may need neurosurgery evaluation.  It is also difficult to ascertain to what extent the right leg numbness and symptoms are new and compounded on chronic condition.  UA and urine drug screen pending.  If patient has urinary retention may consider placing Foley catheter.  Attempt getting a urine sample with I&O catheter.  Chronic pain syndrome/chronic leg pain/chronic back pain Patient is on long-acting pain medications which has been held due to the IV pain medications.  Continue when necessary oral oxycodone.  Depression and anxiety Stable.  COPD Stable at this time.  Inhalers as needed.  Prophylaxis Lovenox in the morning if CT is negative.  CODE STATUS Full code.  Disposition Admit patient as observation to medical bed.  Time spent on admission, talking to the patient, and coordinating care was: 50 mins.  Shakaya Bhullar A, MD 01/31/2014, 12:49 AM

## 2014-01-31 NOTE — Evaluation (Signed)
Physical Therapy Evaluation Patient Details Name: Denise Ellis Kuss MRN: [id number] DOB: 02/16/1981 Today's Date: 01/31/2014   History of Present Illness  Denise Ellis Salah is a 33 y.o. Caucasian female adm 01/30/14  after fall at home that occured while she was walking to the kitchen,  lost her balance and fell backwards and landed on R buttock and  back.  She had severe pain in her back and numbness in her right leg.  She was not able to get up.    PMHx:degenerative disc disease, depression, anxiety, and chronic right leg pain  PMHx:degenerative disc disease, depression, anxiety, and chronic right leg pain . xrays grossly negative for thoracic and lumbar spine, MRI pending  Clinical Impression  Pt admitted with back pain. Pt currently with functional limitations due to the deficits listed below (see PT Problem List). Pt will benefit from skilled PT to increase their independence and safety with mobility to allow return home     Follow Up Recommendations Home health PT (?)    Equipment Recommendations  Rolling walker with 5" wheels    Recommendations for Other Services       Precautions / Restrictions Precautions Precautions: Back      Mobility  Bed Mobility Overal bed mobility: Needs Assistance Bed Mobility: Supine to Sit     Supine to sit: Supervision     General bed mobility comments: for safety, incr time  Transfers Overall transfer level: Needs assistance Equipment used: Rolling walker (2 wheeled) Transfers: Sit to/from BJ'sStand;Stand Pivot Transfers Sit to Stand: Min assist;+2 physical assistance;+2 safety/equipment;Min guard Stand pivot transfers: Min guard;+2 physical assistance;+2 safety/equipment       General transfer comment: pt shaky with initial standing, +2 for safety; pt with multi-directional LOB, min to min guard for recovery  Ambulation/Gait Ambulation/Gait assistance: Min assist;+2 safety/equipment;+2 physical assistance Ambulation Distance (Feet): 5  Feet Assistive device: Rolling walker (2 wheeled) Gait Pattern/deviations: Step-to pattern Gait velocity: pivotal steps to chair   General Gait Details: cues for RW position and safety; pt shaky, knees buckling slightly at times; pt fwith c/o dizziness, VSS after pt to chair  Stairs            Wheelchair Mobility    Modified Rankin (Stroke Patients Only)       Balance Overall balance assessment: Needs assistance Sitting-balance support: Bilateral upper extremity supported;Feet supported Sitting balance-Leahy Scale: Fair     Standing balance support: Bilateral upper extremity supported;During functional activity Standing balance-Leahy Scale: Poor                               Pertinent Vitals/Pain Pain Assessment: 0-10 Pain Score: 8  Pain Location: back Pain Descriptors / Indicators:  (pt does not describe, states she is always on pain) Pain Intervention(s): Monitored during session;Premedicated before session    Home Living Family/patient expects to be discharged to:: Private residence Living Arrangements: Spouse/significant other   Type of Home: Mobile home Home Access: Stairs to enter Entrance Stairs-Rails: Right Entrance Stairs-Number of Steps: 5 Home Layout: One level Home Equipment: Walker - standard;Cane - single point Additional Comments: wears O2 at night; pt states she stays in bed "a lot" at home    Prior Function Level of Independence: Independent with assistive device(s);Needs assistance   Gait / Transfers Assistance Needed: used cane at home   ADL's / Homemaking Assistance Needed: assistance from husband for shower, exhausting per pt        Hand  Dominance        Extremity/Trunk Assessment   Upper Extremity Assessment: Overall WFL for tasks assessed           Lower Extremity Assessment: RLE deficits/detail RLE Deficits / Details: appears to position R ankle in inversion, moves all extremities against gravity without  difficulty       Communication   Communication: No difficulties  Cognition Arousal/Alertness: Awake/alert Behavior During Therapy: Flat affect Overall Cognitive Status: Impaired/Different from baseline Area of Impairment: Problem solving             Problem Solving: Slow processing;Decreased initiation;Requires verbal cues General Comments: pt requiring incr time for all aspects (questions and mobility); pt states she is in severe pain however I had to awaken her for mobility, she had fallen asleep in the middle of eating her grits, spoon still in hand    General Comments      Exercises        Assessment/Plan    PT Assessment Patient needs continued PT services  PT Diagnosis Difficulty walking;Generalized weakness   PT Problem List Decreased strength;Decreased activity tolerance;Decreased balance;Decreased mobility  PT Treatment Interventions DME instruction;Gait training;Functional mobility training;Therapeutic activities;Therapeutic exercise;Patient/family education;Stair training   PT Goals (Current goals can be found in the Care Plan section) Acute Rehab PT Goals Patient Stated Goal: pt does not state PT Goal Formulation: With patient Time For Goal Achievement: 02/07/14 Potential to Achieve Goals: Fair    Frequency     Barriers to discharge        Co-evaluation               End of Session Equipment Utilized During Treatment: Gait belt Activity Tolerance: Other (comment);Patient limited by pain (dizziness) Patient left: in chair;with call bell/phone within reach;with family/visitor present Nurse Communication: Mobility status    Functional Assessment Tool Used: clinical observation Functional Limitation: Mobility: Walking and moving around Mobility: Walking and Moving Around Current Status (U0454): At least 20 percent but less than 40 percent impaired, limited or restricted Mobility: Walking and Moving Around Goal Status [phone]): At least 1 percent  but less than 20 percent impaired, limited or restricted    Time: 1028-1054 PT Time Calculation (min): 26 min   Charges:   PT Evaluation $Initial PT Evaluation Tier I: 1 Procedure PT Treatments $Therapeutic Activity: 23-37 mins   PT G Codes:   Functional Assessment Tool Used: clinical observation Functional Limitation: Mobility: Walking and moving around    [facility] 01/31/2014, 11:58 AM

## 2014-02-01 DIAGNOSIS — W19XXXD Unspecified fall, subsequent encounter: Secondary | ICD-10-CM

## 2014-02-01 DIAGNOSIS — W19XXXA Unspecified fall, initial encounter: Secondary | ICD-10-CM | POA: Diagnosis not present

## 2014-02-01 DIAGNOSIS — R339 Retention of urine, unspecified: Secondary | ICD-10-CM | POA: Diagnosis present

## 2014-02-01 DIAGNOSIS — K5909 Other constipation: Secondary | ICD-10-CM | POA: Diagnosis present

## 2014-02-01 DIAGNOSIS — Z79899 Other long term (current) drug therapy: Secondary | ICD-10-CM | POA: Diagnosis not present

## 2014-02-01 DIAGNOSIS — F4310 Post-traumatic stress disorder, unspecified: Secondary | ICD-10-CM | POA: Diagnosis present

## 2014-02-01 DIAGNOSIS — R200 Anesthesia of skin: Secondary | ICD-10-CM | POA: Diagnosis present

## 2014-02-01 DIAGNOSIS — I959 Hypotension, unspecified: Secondary | ICD-10-CM | POA: Diagnosis present

## 2014-02-01 DIAGNOSIS — F329 Major depressive disorder, single episode, unspecified: Secondary | ICD-10-CM | POA: Diagnosis present

## 2014-02-01 DIAGNOSIS — Z833 Family history of diabetes mellitus: Secondary | ICD-10-CM | POA: Diagnosis not present

## 2014-02-01 DIAGNOSIS — M549 Dorsalgia, unspecified: Secondary | ICD-10-CM | POA: Diagnosis present

## 2014-02-01 DIAGNOSIS — Z91041 Radiographic dye allergy status: Secondary | ICD-10-CM | POA: Diagnosis not present

## 2014-02-01 DIAGNOSIS — E669 Obesity, unspecified: Secondary | ICD-10-CM | POA: Diagnosis present

## 2014-02-01 DIAGNOSIS — G894 Chronic pain syndrome: Secondary | ICD-10-CM | POA: Diagnosis present

## 2014-02-01 DIAGNOSIS — Z888 Allergy status to other drugs, medicaments and biological substances status: Secondary | ICD-10-CM | POA: Diagnosis not present

## 2014-02-01 DIAGNOSIS — W010XXA Fall on same level from slipping, tripping and stumbling without subsequent striking against object, initial encounter: Secondary | ICD-10-CM | POA: Diagnosis present

## 2014-02-01 DIAGNOSIS — T40605A Adverse effect of unspecified narcotics, initial encounter: Secondary | ICD-10-CM | POA: Diagnosis present

## 2014-02-01 DIAGNOSIS — Z8249 Family history of ischemic heart disease and other diseases of the circulatory system: Secondary | ICD-10-CM | POA: Diagnosis not present

## 2014-02-01 DIAGNOSIS — Z87891 Personal history of nicotine dependence: Secondary | ICD-10-CM | POA: Diagnosis not present

## 2014-02-01 DIAGNOSIS — Z6836 Body mass index (BMI) 36.0-36.9, adult: Secondary | ICD-10-CM | POA: Diagnosis not present

## 2014-02-01 DIAGNOSIS — M79601 Pain in right arm: Secondary | ICD-10-CM

## 2014-02-01 DIAGNOSIS — Y92010 Kitchen of single-family (private) house as the place of occurrence of the external cause: Secondary | ICD-10-CM | POA: Diagnosis not present

## 2014-02-01 DIAGNOSIS — D509 Iron deficiency anemia, unspecified: Secondary | ICD-10-CM | POA: Diagnosis present

## 2014-02-01 DIAGNOSIS — Y92230 Patient room in hospital as the place of occurrence of the external cause: Secondary | ICD-10-CM | POA: Diagnosis not present

## 2014-02-01 DIAGNOSIS — Z8041 Family history of malignant neoplasm of ovary: Secondary | ICD-10-CM | POA: Diagnosis not present

## 2014-02-01 DIAGNOSIS — M79604 Pain in right leg: Secondary | ICD-10-CM | POA: Diagnosis present

## 2014-02-01 DIAGNOSIS — J449 Chronic obstructive pulmonary disease, unspecified: Secondary | ICD-10-CM | POA: Diagnosis present

## 2014-02-01 DIAGNOSIS — K5090 Crohn's disease, unspecified, without complications: Secondary | ICD-10-CM | POA: Diagnosis present

## 2014-02-01 DIAGNOSIS — F419 Anxiety disorder, unspecified: Secondary | ICD-10-CM | POA: Diagnosis present

## 2014-02-01 LAB — BASIC METABOLIC PANEL
ANION GAP: 11 (ref 5–15)
BUN: 9 mg/dL (ref 6–23)
CO2: 25 mEq/L (ref 19–32)
Calcium: 9 mg/dL (ref 8.4–10.5)
Chloride: 105 mEq/L (ref 96–112)
Creatinine, Ser: 0.57 mg/dL (ref 0.50–1.10)
Glucose, Bld: 102 mg/dL — ABNORMAL HIGH (ref 70–99)
POTASSIUM: 4.4 meq/L (ref 3.7–5.3)
Sodium: 141 mEq/L (ref 137–147)

## 2014-02-01 LAB — IRON AND TIBC
IRON: 42 ug/dL (ref 42–135)
Saturation Ratios: 16 % — ABNORMAL LOW (ref 20–55)
TIBC: 262 ug/dL (ref 250–470)
UIBC: 220 ug/dL (ref 125–400)

## 2014-02-01 LAB — VITAMIN B12: VITAMIN B 12: 273 pg/mL (ref 211–911)

## 2014-02-01 LAB — CBC
HCT: 34.4 % — ABNORMAL LOW (ref 36.0–46.0)
Hemoglobin: 10.7 g/dL — ABNORMAL LOW (ref 12.0–15.0)
MCH: 27.2 pg (ref 26.0–34.0)
MCHC: 31.1 g/dL (ref 30.0–36.0)
MCV: 87.5 fL (ref 78.0–100.0)
Platelets: 309 10*3/uL (ref 150–400)
RBC: 3.93 MIL/uL (ref 3.87–5.11)
RDW: 14.2 % (ref 11.5–15.5)
WBC: 7.9 10*3/uL (ref 4.0–10.5)

## 2014-02-01 LAB — TSH: TSH: 1.9 u[IU]/mL (ref 0.350–4.500)

## 2014-02-01 LAB — FERRITIN: Ferritin: 20 ng/mL (ref 10–291)

## 2014-02-01 LAB — TRANSFERRIN: Transferrin: 206 mg/dL — ABNORMAL LOW (ref 200–360)

## 2014-02-01 LAB — FOLATE RBC: RBC FOLATE: 754 ng/mL — AB (ref >280)

## 2014-02-01 MED ORDER — CLONAZEPAM 1 MG PO TABS
2.0000 mg | ORAL_TABLET | Freq: Three times a day (TID) | ORAL | Status: DC
  Administered 2014-02-01 – 2014-02-04 (×9): 2 mg via ORAL
  Filled 2014-02-01 (×9): qty 4

## 2014-02-01 MED ORDER — METHYLPREDNISOLONE 4 MG PO KIT
4.0000 mg | PACK | ORAL | Status: AC
  Administered 2014-02-01: 4 mg via ORAL

## 2014-02-01 MED ORDER — HYDROXYZINE HCL 50 MG PO TABS
50.0000 mg | ORAL_TABLET | Freq: Two times a day (BID) | ORAL | Status: DC
  Administered 2014-02-01 – 2014-02-04 (×6): 50 mg via ORAL
  Filled 2014-02-01 (×7): qty 1

## 2014-02-01 MED ORDER — METHYLPREDNISOLONE 4 MG PO KIT
8.0000 mg | PACK | Freq: Every evening | ORAL | Status: AC
  Administered 2014-02-02: 8 mg via ORAL
  Filled 2014-02-01: qty 21

## 2014-02-01 MED ORDER — METHYLPREDNISOLONE 4 MG PO KIT
8.0000 mg | PACK | Freq: Every evening | ORAL | Status: AC
  Administered 2014-02-01: 8 mg via ORAL

## 2014-02-01 MED ORDER — METHYLPREDNISOLONE 4 MG PO KIT
4.0000 mg | PACK | Freq: Four times a day (QID) | ORAL | Status: DC
  Administered 2014-02-03 – 2014-02-04 (×5): 4 mg via ORAL

## 2014-02-01 MED ORDER — METHYLPREDNISOLONE 4 MG PO KIT
8.0000 mg | PACK | Freq: Every morning | ORAL | Status: AC
  Administered 2014-02-01: 8 mg via ORAL
  Filled 2014-02-01: qty 21

## 2014-02-01 MED ORDER — METHYLPREDNISOLONE 4 MG PO KIT
4.0000 mg | PACK | Freq: Three times a day (TID) | ORAL | Status: AC
  Administered 2014-02-02 (×2): 4 mg via ORAL

## 2014-02-01 MED ORDER — METHYLPREDNISOLONE 4 MG PO KIT: 4.0000 mg | PACK | ORAL | Status: AC
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
## 2014-02-01 NOTE — Progress Notes (Signed)
[facility] PROGRESS NOTE  Denise Ellis ZOX:[id number] DOB: May 31, 1980 DOA: 01/30/2014 PCP: Marin Comment, FNP  Assessment/Plan  Fall with right leg numbness.  Per her PCP's office, she does not walk with a cane or limp.  Pt reports she has some baseline numbness of the right leg, but that now she cannot feel anything on the right leg.  Initially she had a hard time telling me how high the numbness went, but later stated it was to the level of the gluteal crease.  Endorsed saddle anesthesia.  Objectively, however, she has intact sensation and no hemineglect.  She has chronic RUE and RLE 4/5 strength which is unchanged per her report.  CT demonstrated no fracture or subluxation but a mild bulging disk at L4-5 and L5-S1 on the right with mild foraminal narrowing.  At L5-S1 the nerve root may be compressed.  Her findings do not explain her reported saddle anesthesia or her urinary retention.   -  MRI lumbar spine:  Right foraminal disc protrusions at L4-5 and L5-S1 -  Started medrol dose pack -  Case discussed with Dr. Lovell Sheehan, NSGY, who recommends outpatient follow up -  PT recommending rolling walker and possibly home health >> reassessment is pending  Chronic pain, followed by pain clinic in [place].  Patient has been on long-term pain management, however, she was unable to accurately report her medications.   -  I called her PCP's office:  Their medication list was not updated -  Spoke with on call person from her [facility] pain clinic office:  Primary is [name], [phone].  She is on MS contin 60mg  BID and oxycodone 10mg  four times daily -  Will continue her home regimen  Acute urinary retention.  During her previous ER visits, she has had some difficulty urinating also.  Suspect this may be due to her high dose narcotics, worsened by the escalation in her dose -  Foley placed with of urine removed -  Started flomax 10/11 -  Voiding trial ongoing, may need chronic  indwelling foley with urology follow up  Constipation, likely due to narcotics -  Start colace, senna, and schedule miralax  -  Bisacodyl prn  Hypotension, improving with IVF -  F/u cortisol level -  Continue IVF  Depression and anxiety, probably contributing greatly to her pain and generalized illness/weakness.  She is not on SSRI but reports she is on high dose clonazepam.   -  Patient refuses fluoxetine and states "I have been on all of those medications."  States her anxiety is managed by her PCP.    -  PCP confirms clonazepam to 2mg  TID -  Pharmacist to verify her medication list tomorrow when pharmacy opens  I question her reported diagnosis of COPD.  She may have some asthma.   -  Continue prn albuterol  -  Outpatient PFTs  Mild Crohn's disease.  The patient has been seen in the ER twice in the last two months prior to this admission and has gotten two CT abd/pelvis.  The first showed mild proctitis and the second was normal.   -  Needs GI follow up  Normocytic anemia -  Iron studies c/w mild iron deficiency, b12 273, folate 754, TSH 1.9, occult stool  Diet:  Healthy heart Access:  PIV IVF:  yes Proph:  lovenox  Code Status: full Family Communication: patient and her husband Disposition Plan:  Possibly home tomorrow   Consultants:  Spoke with Dr. Lovell Sheehan, NSGY by phone  Procedures:  CT thoracic and lumbar spine  MRI lumbar spine  Antibiotics:  none   HPI/Subjective:  States she cannot move because she is in so much pain.  Still has "pins and needles" sensation in her right leg.    Objective: Filed Vitals:   01/31/14 1349 01/31/14 1846 01/31/14 2159 02/01/14 0428  BP: 95/56  104/64 108/60  Pulse: 68  75 74  Temp: 97.8 F (36.6 C)  98 F (36.7 C) 98.4 F (36.9 C)  TempSrc: Oral  Oral Oral  Resp: 16  17 18   Height:  5\' 6"  (1.676 m)    Weight:  104.327 kg (230 lb)    SpO2: 100%  98% 100%    Intake/Output Summary (Last 24 hours) at 02/01/14  1432 Last data filed at 02/01/14 1000  Gross per 24 hour  Intake 1738.75 ml  Output   3825 ml  Net -2086.25 ml   Filed Weights   01/31/14 1846  Weight: 104.327 kg (230 lb)    Exam:   General:  WF, No acute distress  HEENT:  NCAT, MMM  Cardiovascular:  RRR, nl S1, S2 no mrg, 2+ pulses, warm extremities  Respiratory:  CTAB, no increased WOB  Abdomen:   NABS, soft, ND, diffusely TTP without rebound or guarding  MSK:   Normal tone and bulk, no LEE  Neuro:  CN II-XII grossly intact.  4/5 RUE and RLE strength, 5/5 LUE and LLE.  Sensation INTACT bilaterally.  No hemineglect.  No dysmetria.    Data Reviewed: Basic Metabolic Panel:  Recent Labs Lab 01/30/14 2222 01/31/14 0555 02/01/14 0423  NA 144 142 141  K 3.3* 3.7 4.4  CL 110 109 105  CO2  --  24 25  GLUCOSE 78 120* 102*  BUN 19 17 9   CREATININE 0.70 0.67 0.57  CALCIUM  --  8.1* 9.0   Liver Function Tests: No results found for this basename: AST, ALT, ALKPHOS, BILITOT, PROT, ALBUMIN,  in the last 168 hours No results found for this basename: LIPASE, AMYLASE,  in the last 168 hours No results found for this basename: AMMONIA,  in the last 168 hours CBC:  Recent Labs Lab 01/30/14 2211 01/30/14 2222 01/31/14 0555 02/01/14 0423  WBC 9.7  --  9.4 7.9  NEUTROABS 5.1  --   --   --   HGB 11.4* 12.6 10.5* 10.7*  HCT 35.8* 37.0 32.7* 34.4*  MCV 87.1  --  87.2 87.5  PLT 310  --  308 309   Cardiac Enzymes: No results found for this basename: CKTOTAL, CKMB, CKMBINDEX, TROPONINI,  in the last 168 hours BNP (last 3 results) No results found for this basename: PROBNP,  in the last 8760 hours CBG: No results found for this basename: GLUCAP,  in the last 168 hours  No results found for this or any previous visit (from the past 240 hour(s)).   Studies: Dg Thoracic Spine 2 View  01/30/2014   CLINICAL DATA:  Fall, back pain  EXAM: THORACIC SPINE - 2 VIEW  COMPARISON:  12/30/2013 and CT chest 12/15/2012  FINDINGS:  Three views of thoracic spine submitted. No acute fracture or subluxation. Alignment, disc spaces and vertebral heights are preserved.  IMPRESSION: Negative.   Electronically Signed   By: Natasha Mead M.D.   On: 01/30/2014 19:35   Dg Lumbar Spine Complete  01/31/2014   CLINICAL DATA:  Diffuse lower back pain after falling backwards while walking. Patient's legs gave out. Did not hit  head. Initial encounter.  EXAM: LUMBAR SPINE - COMPLETE 4+ VIEW  COMPARISON:  Lumbar spine radiographs performed 10/01/2013  FINDINGS: There is no evidence of fracture or subluxation. Vertebral bodies demonstrate normal height and alignment. Intervertebral disc spaces are preserved. The visualized neural foramina are grossly unremarkable in appearance.  The visualized bowel gas pattern is unremarkable in appearance; air and stool are noted within the colon. The sacroiliac joints are within normal limits.  IMPRESSION: No evidence of fracture or subluxation along the lumbar spine.   Electronically Signed   By: Roanna Raider M.D.   On: 01/31/2014 00:13   Ct Thoracic Spine Wo Contrast  01/31/2014   CLINICAL DATA:  Lost balance and fell backward while walking in kitchen. Landed on back and buttocks, with severe diffuse back pain and right leg numbness. Cannot ambulate. Initial encounter.  EXAM: CT THORACIC AND LUMBAR SPINE WITHOUT CONTRAST  TECHNIQUE: Multidetector CT imaging of the thoracic and lumbar spine was performed without contrast. Multiplanar CT image reconstructions were also generated.  COMPARISON:  Lumbar and thoracic spine radiographs performed 01/30/2014  FINDINGS: CT THORACIC SPINE FINDINGS  There is no evidence of fracture or subluxation along the thoracic spine. Vertebral bodies demonstrate normal height and alignment. Intervertebral disc spaces are preserved. The bony foramina are unremarkable in appearance bilaterally. There is no evidence of foraminal narrowing.  The thyroid gland is unremarkable. The visualized  portions of the mediastinum are within normal limits. Minimal bilateral atelectasis is noted.  The paraspinal musculature is unremarkable in appearance.  CT LUMBAR SPINE FINDINGS  There is no evidence of fracture or subluxation along the lumbar spine. Vertebral bodies demonstrate normal height and alignment. Intervertebral disc spaces are preserved. The bony foramina are unremarkable in appearance bilaterally.  There may be mild bulging of the disc at L4-L5 and L5-S1 on the right side, with mild associated foraminal narrowing. At L5-S1, this may impress on the exiting nerve root; would correlate with the level of the patient's symptoms. Minimal facet disease is noted at the lower lumbar spine.  Visualized bowel structures are unremarkable in appearance. Postoperative change is noted about the cecum. The visualized portions of the kidneys are unremarkable. No vascular calcification is seen.  The paraspinal musculature is unremarkable in appearance.  IMPRESSION: 1. No evidence of fracture or subluxation along the thoracic or lumbar spine. 2. There may be mild bulging of the disc at L4-L5 and L5-S1 on the right side, with mild associated foraminal narrowing. At L5-S1, this may impress on the exiting nerve root. Would correlate with the level of the patient's symptoms.   Electronically Signed   By: Roanna Raider M.D.   On: 01/31/2014 01:46   Ct Lumbar Spine Wo Contrast  01/31/2014   CLINICAL DATA:  Lost balance and fell backward while walking in kitchen. Landed on back and buttocks, with severe diffuse back pain and right leg numbness. Cannot ambulate. Initial encounter.  EXAM: CT THORACIC AND LUMBAR SPINE WITHOUT CONTRAST  TECHNIQUE: Multidetector CT imaging of the thoracic and lumbar spine was performed without contrast. Multiplanar CT image reconstructions were also generated.  COMPARISON:  Lumbar and thoracic spine radiographs performed 01/30/2014  FINDINGS: CT THORACIC SPINE FINDINGS  There is no evidence of  fracture or subluxation along the thoracic spine. Vertebral bodies demonstrate normal height and alignment. Intervertebral disc spaces are preserved. The bony foramina are unremarkable in appearance bilaterally. There is no evidence of foraminal narrowing.  The thyroid gland is unremarkable. The visualized portions of the mediastinum are  within normal limits. Minimal bilateral atelectasis is noted.  The paraspinal musculature is unremarkable in appearance.  CT LUMBAR SPINE FINDINGS  There is no evidence of fracture or subluxation along the lumbar spine. Vertebral bodies demonstrate normal height and alignment. Intervertebral disc spaces are preserved. The bony foramina are unremarkable in appearance bilaterally.  There may be mild bulging of the disc at L4-L5 and L5-S1 on the right side, with mild associated foraminal narrowing. At L5-S1, this may impress on the exiting nerve root; would correlate with the level of the patient's symptoms. Minimal facet disease is noted at the lower lumbar spine.  Visualized bowel structures are unremarkable in appearance. Postoperative change is noted about the cecum. The visualized portions of the kidneys are unremarkable. No vascular calcification is seen.  The paraspinal musculature is unremarkable in appearance.  IMPRESSION: 1. No evidence of fracture or subluxation along the thoracic or lumbar spine. 2. There may be mild bulging of the disc at L4-L5 and L5-S1 on the right side, with mild associated foraminal narrowing. At L5-S1, this may impress on the exiting nerve root. Would correlate with the level of the patient's symptoms.   Electronically Signed   By: Roanna RaiderJeffery  Chang M.D.   On: 01/31/2014 01:46   Mr Lumbar Spine Wo Contrast  01/31/2014   CLINICAL DATA:  Low back and right leg pain and numbness. Patient fell yesterday in her kitchen.  EXAM: MRI LUMBAR SPINE WITHOUT CONTRAST  TECHNIQUE: Multiplanar, multisequence MR imaging of the lumbar spine was performed. No  intravenous contrast was administered.  COMPARISON:  CT scan 01/31/2014  FINDINGS: Normal alignment of the lumbar vertebral bodies. They demonstrate normal marrow signal. No acute fracture or bone lesion. The last full intervertebral disc space is labeled L5-S1 and the conus medullaris terminates at L2. There is small disc space at S1-2. The facets are normally aligned. No pars defects. No significant paraspinal or retroperitoneal findings are demonstrated.  L1-2:  No significant findings.  L2-3:  No significant findings.  L3-4:  No significant findings.  L4-5: Right foraminal/extra foraminal disc protrusion comes close to contacting and could irritate the right L4 nerve root. No spinal or lateral recess stenosis.  L5-S1: Shallow right foraminal disc protrusion comes close to contacting the right L5 nerve root and certainly could irritated. No spinal stenosis. Mild facet disease.  IMPRESSION: Right foraminal disc protrusions at L4-5 and L5-S1.  No acute bony findings.  The conus medullaris terminates at the bottom of L2.   Electronically Signed   By: Loralie ChampagneMark  Gallerani M.D.   On: 01/31/2014 16:18    Scheduled Meds: . clonazePAM  2 mg Oral TID  . docusate sodium  100 mg Oral BID  . enoxaparin (LOVENOX) injection  40 mg Subcutaneous Q24H  . FLUoxetine  20 mg Oral QHS  . hydrOXYzine  50 mg Oral BID  . methylPREDNISolone  4 mg Oral PC lunch  . methylPREDNISolone  4 mg Oral PC supper  . [START ON 02/02/2014] methylPREDNISolone  4 mg Oral 3 x daily with food  . [START ON 02/03/2014] methylPREDNISolone  4 mg Oral 4X daily taper  . methylPREDNISolone  8 mg Oral AC breakfast  . methylPREDNISolone  8 mg Oral Nightly  . [START ON 02/02/2014] methylPREDNISolone  8 mg Oral Nightly  . morphine  60 mg Oral Q12H  . polyethylene glycol  17 g Oral Daily  . senna  2 tablet Oral QHS  . tamsulosin  0.4 mg Oral QPC supper   Continuous Infusions:  Principal Problem:   Fall Active Problems:   COPD (chronic  obstructive pulmonary disease)   Chronic leg pain   Chronic back pain   Depression   Anxiety   Right leg numbness   Obesity    Time spent: 30 min    Kalianne Fetting  [facility] Pager [phone]. If 7PM-7AM, please contact night-coverage at [email or web address], password [facility] 02/01/2014, 2:32 PM  LOS: 2 days

## 2014-02-01 NOTE — Evaluation (Signed)
Occupational Therapy Evaluation Patient Details Name: Denise Ellis Glidden MRN: [id number] DOB: 10/20/1980 Today's Date: 02/01/2014    History of Present Illness Denise Ellis Gehling is a 33 y.o. Caucasian female adm after fall at home that occured while she was walking to the kitchen she lost her balance and fell backwards and landed on R buttock and  back.  She had severe pain in her back and numbness in her right leg.  She was not able to get up.    PMHx:degenerative disc disease, depression, anxiety, and chronic right leg pain  PMHx:degenerative disc disease, depression, anxiety, and chronic right leg pain . xrays grossly negative for thoracic and lumbar spine, MRI reports Right foraminal disc protrusions at L4-5 and L5-S1.   Clinical Impression   Pt admitted s/p fall in which she fell backwards . Pt currently with functional limitations due to the deficits listed below (see OT Problem List).  Pt will benefit from skilled OT to increase their safety and independence with ADL and functional mobility for ADL to facilitate discharge to venue listed below.      Follow Up Recommendations  Home health OT;Supervision - Intermittent    Equipment Recommendations  3 in 1 bedside comode;Tub/shower seat       Precautions / Restrictions Precautions Precautions: Back      Mobility Bed Mobility Overal bed mobility: Needs Assistance Bed Mobility: Supine to Sit;Sit to Supine     Supine to sit: Supervision Sit to supine: Supervision   General bed mobility comments: increased time and effort  Transfers Overall transfer level: Needs assistance Equipment used: Rolling walker (2 wheeled) Transfers: Sit to/from [employer]Stand;Stand Pivot Transfers Sit to Stand: Min guard Stand pivot transfers: Min guard       General transfer comment: verbal cues for technique including hand placement and controlling transfers with legs and not from back         ADL Overall ADL's : Needs  assistance/impaired Eating/Feeding: Set up;Sitting   Grooming: Set up;Sitting   Upper Body Bathing: Set up;Sitting   Lower Body Bathing: Set up;Sit to/from stand       Lower Body Dressing: Minimal assistance;Sit to/from stand   Toilet Transfer: Firefighterupervision/safety;RW Toilet Transfer Details (indicate cue type and reason): bed to chair Toileting- Clothing Manipulation and Hygiene: Minimal assistance       Functional mobility during ADLs: Minimal assistance;Rolling walker General ADL Comments: increased time and encouragement needed. Pt performed sponge bath and brushed teeth for first time since being here per pt. Pt needed encouragment but was agreeable.  Pt needed Vc for completeness               Pertinent Vitals/Pain Pain Assessment: 0-10 Pain Score: 8  Pain Location: back Pain Intervention(s): Repositioned;Monitored during session     Hand Dominance     Extremity/Trunk Assessment Upper Extremity Assessment Upper Extremity Assessment: Generalized weakness           Communication Communication Communication: No difficulties   Cognition Arousal/Alertness: Awake/alert Behavior During Therapy: WFL for tasks assessed/performed Overall Cognitive Status: Within Functional Limits for tasks assessed             Problem Solving: Slow processing;Decreased initiation;Requires verbal cues     General Comments   With encouragement pt did perform sponge bath and grooming            Home Living Family/patient expects to be discharged to:: Private residence Living Arrangements: Spouse/significant other Available Help at Discharge: Family Type of Home: Mobile home Home Access: Stairs  to enter   Entrance Stairs-Rails: Right Home Layout: One level     Bathroom Shower/Tub: Tub/shower unit Shower/tub characteristics: Door       Home Equipment: Walker - standard;Cane - single point          Prior Functioning/Environment Level of Independence:  Independent with assistive device(s);Needs assistance  Gait / Transfers Assistance Needed: used cane at home  ADL's / Homemaking Assistance Needed: assistance from husband for shower, exhausting per pt        OT Diagnosis: Generalized weakness;Acute pain   OT Problem List: Decreased strength;Pain   OT Treatment/Interventions: Self-care/ADL training;DME and/or AE instruction;Patient/family education    OT Goals(Current goals can be found in the care plan section) Acute Rehab OT Goals Patient Stated Goal: wants to show horses OT Goal Formulation: With patient Time For Goal Achievement: 02/08/14 Potential to Achieve Goals: Good ADL Goals Pt Will Perform Grooming: with modified independence;standing Pt Will Perform Lower Body Bathing: with modified independence;sit to/from stand Pt Will Perform Lower Body Dressing: with modified independence;sit to/from stand Pt Will Transfer to Toilet: with modified independence;bedside commode;regular height toilet Pt Will Perform Toileting - Clothing Manipulation and hygiene: with modified independence;sit to/from stand  OT Frequency: Min 2X/week              End of Session Nurse Communication: Mobility status  Activity Tolerance: Patient tolerated treatment well Patient left: in chair;with call bell/phone within reach   Time: 9924-2683 OT Time Calculation (min): 33 min Charges:  OT General Charges $OT Visit: 1 Procedure OT Evaluation $Initial OT Evaluation Tier I: 1 Procedure OT Treatments $Self Care/Home Management : 23-37 mins G-Codes:    Einar Crow D 2014-02-14, 3:51 PM

## 2014-02-01 NOTE — Progress Notes (Signed)
Spoke with patient husband who states he does not want wife to come home and would like to speak with physician.  Dr Malachi BondsShort made aware, asked PT to see patient.  Will follow up

## 2014-02-01 NOTE — Progress Notes (Signed)
Physical Therapy Treatment Patient Details Name: Denise GrumblesHeather Ellis MRN: [id number] DOB: 11/01/1980 Today's Date: 02/01/2014    History of Present Illness Denise GrumblesHeather Ellis is a 33 y.o. Caucasian female adm after fall at home that occured while she was walking to the kitchen she lost her balance and fell backwards and landed on R buttock and  back.  She had severe pain in her back and numbness in her right leg.  She was not able to get up.    PMHx:degenerative disc disease, depression, anxiety, and chronic right leg pain  PMHx:degenerative disc disease, depression, anxiety, and chronic right leg pain . xrays grossly negative for thoracic and lumbar spine, MRI reports Right foraminal disc protrusions at L4-5 and L5-S1.    PT Comments    Pt only able to tolerate room distance ambulation today and continues to report increased back pain with any R LE movement.  Pt states her spouse works all day and into late nights at times and no one to assist her at home.  Discussed HHPT vs SNF as pt fearful of falling at home and having no one there.  Pt would benefit from f/u PT upon d/c.   Follow Up Recommendations  SNF     Equipment Recommendations  Rolling walker with 5" wheels;3in1 (PT)    Recommendations for Other Services       Precautions / Restrictions Precautions Precautions: Back    Mobility  Bed Mobility Overal bed mobility: Needs Assistance Bed Mobility: Supine to Sit;Sit to Supine     Supine to sit: Supervision Sit to supine: Supervision   General bed mobility comments: increased time and effort  Transfers Overall transfer level: Needs assistance Equipment used: Rolling walker (2 wheeled) Transfers: Sit to/from Stand Sit to Stand: Min guard         General transfer comment: verbal cues for technique including hand placement and controlling transfers with legs and not from back  Ambulation/Gait Ambulation/Gait assistance: Min guard Ambulation Distance (Feet): 20  Feet Assistive device: Rolling walker (2 wheeled) Gait Pattern/deviations: Step-to pattern;Antalgic     General Gait Details: verbal cues for sequence, RW distance, reports pain with R LE WBing, encouraged to use UEs through RW for pain control   Stairs            Wheelchair Mobility    Modified Rankin (Stroke Patients Only)       Balance                                    Cognition Arousal/Alertness: Awake/alert Behavior During Therapy: Flat affect   Area of Impairment: Problem solving             Problem Solving: Slow processing;Decreased initiation;Requires verbal cues      Exercises      General Comments        Pertinent Vitals/Pain Pain Assessment: 0-10 Pain Score: 8  Pain Location: back Pain Intervention(s): Limited activity within patient's tolerance;Monitored during session;Repositioned;Premedicated before session (has Kpad)    Home Living                      Prior Function            PT Goals (current goals can now be found in the care plan section) Progress towards PT goals: Progressing toward goals    Frequency  Min 3X/week    PT Plan Discharge plan needs to  be updated    Co-evaluation             End of Session Equipment Utilized During Treatment: Gait belt Activity Tolerance: Patient limited by pain Patient left: in bed;with call bell/phone within reach     Time: 1415-1430 PT Time Calculation (min): 15 min  Charges:  $Gait Training: 8-22 mins                    G Codes:      Larisa Lanius,KATHrine E Feb 03, 2014, 2:58 PM Zenovia Jarred, PT, DPT Feb 03, 2014 Pager: [phone]

## 2014-02-01 NOTE — Discharge Summary (Signed)
Physician Discharge Summary  Denise Ellis WGN:[id number] DOB: Jul 28, 1980 DOA: 01/30/2014  PCP: Marin Comment, FNP  Admit date: 01/30/2014 Discharge date: 02/02/2014  **PATIENT HAS PAIN CONTRACT.  DO NOT PROVIDE NARCOTIC PRESCRIPTIONS WITHOUT SPEAKING TO HER PAIN SPECIALIST [name], [phone], FIRST. **  Recommendations for Outpatient Follow-up:  1. F/u with Dr. [name], Orthopedics pain clinic, [place], [place] on 10/14 at 11AM at the [place] office 2. F/u with primary care doctor in 1-2 weeks:  Consider PFTs to confirm "COPD" diagnosis.  F/u anemia please.   3. F/u with Endocrinology in 1 month for further testing for adrenal insufficiency 4. F/u with Gastroenterology for possible endoscopy to confirm diagnosis of Crohn's disease  Discharge Diagnoses:  Principal Problem:   Fall Active Problems:   COPD (chronic obstructive pulmonary disease)   Chronic leg pain   Chronic back pain   Depression   Anxiety   Right leg numbness   Obesity   Discharge Condition: stable, improved  Diet recommendation: healthy heart  Wt Readings from Last 3 Encounters:  02/02/14 102.7 kg (226 lb 6.6 oz)  12/30/13 95.255 kg (210 lb)  02/14/12 106.595 kg (235 lb)    History of present illness:  33 year old Caucasian female with history of chronic back pain, degenerative disc disease, depression, anxiety, Crohn's disease, who presented with a reported fall with worsening right leg pain and complete numbness of the right leg.  She stated that 2 hours prior to coming to the emergency department, she lost her balance in her kitchen and fell backwards landing on her butt and her back. Immediately she had severe pain in her back and her right leg with numbness of her right leg. X-rays of the thoracic and lumbar spine were negative for fractures. She was unable to ambulate in the emergency department and therefore was admitted. She underwent CT scan of the lumbar and thoracic spine which  demonstrated no fracture or subluxation but she had a mild bulging disc at L4-5 and L5-S1 on the right with mild foraminal narrowing. There was suggestion at the L5-S1 nerve root may be somewhat compressed.  Hospital Course:   Fall with right leg numbness.   Pt reported she has some baseline numbness of the right leg, but that after her fall she developed complete inability to feel anything in that leg.  Initially she had a hard time telling me how high the numbness went, but later stated it was to the level of the gluteal crease.  She also endorsed saddle anesthesia. Objectively, however, she had intact sensation and no hemineglect.  She has chronic RUE and RLE 4/5 strength which is unchanged per her report. CT demonstrated no fracture or subluxation but she had mild bulging disk at L4-5 and L5-S1 on the right with mild foraminal narrowing. At L5-S1 the nerve root may be compressed. Her findings did not explain her reported saddle anesthesia or her urinary retention.  Her case was discussed with Dr. Lovell Sheehan from [facility] who recommend MRI which confirmed right foraminal disc protrusions at L4-5 and L5-S1.  The disc protrusions came close to contacting the L4 and L5 nerve roots which could cause irritation, but there was no compression or spinal stenosis.  She was started on a Medrol Dosepak and was seen by physical and occupational therapy who recommended skilled nursing facility. Initially, she adamantly declined going to rehabilitation, however she later changed her mind. Because she has been under observation, as she would have to pay out of pocket to go to skilled nursing  facility. She states she is unable to afford this and would prefer to go home. She will be set up with home health services including a social worker to assist her with escalation to a skilled nursing facility at a later time, if needed.  She was ordered a 3 and 1,/shower seat, a rolling walker.  Chronic pain, followed by pain clinic in  [place]. Patient has been on long-term pain management, however, she was unable to accurately report her medications.  Per her PCP's office, she does not walk with a cane or limp and they were unaware of some her narcotic medications that she was being prescribed by her pain clinic.  Her pain clinic reports that the patient has reported her narcotic presciptions missing, called saying her medication was lost or flushed accidentally.  She has presented to multiple ERs requesting additional pain medication due to falls and her pain clinic has had to remind her that that is not allowed under her pain contract.  In fact, she has had two visits to the [facility] ER in the last two months for similar reasons.  Her pain clinic was able to schedule her an appointment with [name], [phone], for the day after discharge 11/14 at 11AM. She is on MS contin 60mg  BID and oxycodone 10mg  four times daily, prescribed by them, and they asked that she NOT receive any additional controlled substance prescriptions at discharge.    Acute urinary retention. During her previous ER visits, she has had some difficulty urinating also. Suspect this may be due to her high dose narcotics, worsened by the escalation in her dose during this admission. She foley catheter placed with 500 mL of urine removed.  She was started on flomax and her foley was removed a day later and she was able to void spontaneously.    Hypotension possibly due to dehydration or adrenal insufficiency, improved somewhat with IVF, however, she has consistently had low BPs during her ER visits also.  Her cortisol level was <0.5.  Unfortunately, she had are ready been started on steroids before performing a cosyntropin stim test to confirm the diagnosis. Recommend that she complete her steroid taper and then start hydrocortisone 50 mg q. a.m. and 10 mg after lunch. She will need to follow up with an endocrinologist within one month for further testing to  confirm the diagnosis.  Depression and anxiety and PTSD probably contributing greatly to her pain and generalized illness/weakness. She is not on SSRI but reports she is on high dose clonazepam.  Patient refuses fluoxetine and states "I have been on all of those medications." States her anxiety is managed by her PCP.  Per her PCP, she refuses to see a psychiatrist or attend counseling and she refuses any medications other than benzodiazepines for treatment of her anxiety.  Clonazepam 2mg  TID was confirmed by PCP's office.  I question her reported diagnosis of COPD. She may have some asthma, and her lungs remained clear.  Continued prn albuterol.  Outpatient PFTs to confirm diagnosis.  Mild Crohn's disease. The patient has been seen in the ER twice in the last two months prior to this admission and has gotten two CT abd/pelvis. The first showed mild proctitis and the second was normal.  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  Recommend GI follow up for confirmatory endoscopy and miralax with colace and senna to keep bowels regular in the meantime.    Constipation, likely due to narcotics.  Started colace, senna, and schedule miralax   Normocytic anemia, Iron studies c/w mild iron deficiency, b12 273, folate 754, TSH 1.9, occult stool.  Recommend starting iron and vitamin B12 supplementation.     Consultants:  Spoke with Dr. Lovell Sheehan, [facility] by phone Procedures:  CT thoracic and lumbar spine  MRI lumbar spine Antibiotics:  none    Discharge Exam: Filed Vitals:   02/02/14 0532  BP: 105/68  Pulse: 86  Temp: 98.1 F (36.7 C)  Resp: 16   Filed Vitals:   02/01/14 0428 02/01/14 2115 02/02/14 0532 02/02/14 0633  BP: 108/60 107/66 105/68   Pulse: 74 89 86   Temp: 98.4 F (36.9 C) 98.4 F (36.9 C) 98.1 F (36.7 C)   TempSrc: Oral Oral Oral   Resp: 18 18 16    Height:      Weight:    102.7 kg (226 lb 6.6 oz)  SpO2: 100% 100% 98%      General: WF, No acute distress  HEENT: NCAT, MMM  Cardiovascular: RRR, nl S1, S2 no mrg, 2+ pulses, warm extremities  Respiratory: CTAB, no increased WOB  Abdomen: NABS, soft, ND, diffusely TTP without rebound or guarding  MSK: Normal tone and bulk, no LEE  Neuro: CN II-XII grossly intact. 4/5 RUE and RLE strength, 5/5 LUE and LLE. Sensation INTACT bilaterally. No hemineglect. No dysmetria.    Discharge Instructions      Discharge Instructions   Call MD for:  difficulty breathing, headache or visual disturbances    Complete by:  As directed      Call MD for:  extreme fatigue    Complete by:  As directed      Call MD for:  hives    Complete by:  As directed      Call MD for:  persistant dizziness or light-headedness    Complete by:  As directed      Call MD for:  persistant nausea and vomiting    Complete by:  As directed      Call MD for:  severe uncontrolled pain    Complete by:  As directed      Call MD for:  temperature >100.4    Complete by:  As directed      Diet general    Complete by:  As directed      Discharge instructions    Complete by:  As directed   You were hospitalized with a fall and back pain with right leg weakness.  Take a medrol dospak, but start on day 2 since you received your first doses in the hospital.  Please follow up with your pain clinic tomorrow at 11AM.  For your anemia, please take iron and vitamin B12 supplements and follow up with a gastroenterologist.  Take flomax which may help you pee a little easier.  Please read through the information about adrenal insufficiency carefully.  Take hydrocortisone 15mg  first thing in the morning and 10mg  after lunch and follow up with an endocrinologist before you run out of medication.  I strongly encourage you to seek help for your depression and anxiety, including trying antidepressant medication and counseling.  Please note that your chart has been flagged that you are a member of a pain clinic and that you  have a pain contract.  So that we do not accidentally violate your pain contract, the [facility] will not give additional prescriptions for narcotic pain medication without speaking to your pain specialist first.  Driving Restrictions    Complete by:  As directed   No driving, operating heavy machinery while taking narcotic pain medication and clonazepam     Increase activity slowly    Complete by:  As directed             Medication List    STOP taking these medications       diphenoxylate-atropine 2.5-0.025 MG per tablet  Commonly known as:  LOMOTIL     loperamide 2 MG capsule  Commonly known as:  IMODIUM     traMADol 50 MG tablet  Commonly known as:  ULTRAM      TAKE these medications       albuterol (2.5 MG/3ML) 0.083% nebulizer solution  Commonly known as:  PROVENTIL  Take 2.5 mg by nebulization 2 (two) times daily as needed for wheezing or shortness of breath.     bisacodyl 10 MG suppository  Commonly known as:  DULCOLAX  Place 1 suppository (10 mg total) rectally daily as needed for moderate constipation.     clonazePAM 2 MG tablet  Commonly known as:  KLONOPIN  Take 2 mg by mouth 3 (three) times daily.     dicyclomine 20 MG tablet  Commonly known as:  BENTYL  Take 20 mg by mouth 4 (four) times daily as needed for spasms.     ferrous sulfate 325 (65 FE) MG EC tablet  Take 1 tablet (325 mg total) by mouth 3 (three) times daily with meals.     hydrocortisone 10 MG tablet  Commonly known as:  CORTEF  Take one and a half tabs at 6AM and one tab at 1PM every day.     hydrOXYzine 50 MG tablet  Commonly known as:  ATARAX/VISTARIL  Take 50 mg by mouth 2 (two) times daily as needed for anxiety.     ibuprofen 200 MG tablet  Commonly known as:  ADVIL,MOTRIN  Take 800 mg by mouth at bedtime.     methylPREDNIsolone 4 MG tablet  Commonly known as:  MEDROL DOSPACK  follow package directions.  Please skip DAY 1 and start with DAY 2.     morphine 60 MG  12 hr tablet  Commonly known as:  MS CONTIN  Take 60 mg by mouth every 12 (twelve) hours.     Oxycodone HCl 10 MG Tabs  Take 10 mg by mouth 4 (four) times daily.     polyethylene glycol powder powder  Commonly known as:  MIRALAX  Take 17 g by mouth daily.     promethazine 12.5 MG tablet  Commonly known as:  PHENERGAN  Take 12.5 mg by mouth every 6 (six) hours as needed for nausea or vomiting.     SPIRIVA RESPIMAT 2.5 MCG/ACT Aers  Generic drug:  Tiotropium Bromide Monohydrate  Inhale 2 puffs into the lungs daily.     tamsulosin 0.4 MG Caps capsule  Commonly known as:  FLOMAX  Take 1 capsule (0.4 mg total) by mouth daily after supper.     vitamin B-12 1000 MCG tablet  Commonly known as:  CYANOCOBALAMIN  Take 1 tablet (1,000 mcg total) by mouth daily.       Follow-up Information   Follow up with Marin CommentWELLS, CHERYL, FNP. Schedule an appointment as soon as possible for a visit in 1 week.   Specialty:  Nurse Practitioner   Contact information:   [address] [place] [id number] [phone]       Follow up with Arlee MuslimStevens, Michael On  02/03/2014. (11 AM)    Contact information:   [facility] [place], [place] office [phone]      Schedule an appointment as soon as possible for a visit with Cristi Loron, MD. (As needed.  Neurosurgery)    Specialty:  Neurosurgery   Contact information:   [address] [place] [address] [phone]       Schedule an appointment as soon as possible for a visit with [facility]. (As needed if you continue to have problems voiding/peeing)    Contact information:   [address] [place] [address] [phone]      Follow up with [facility], MD. Schedule an appointment as soon as possible for a visit in 1 month. (Endocrinology)    Specialty:  Endocrinology   Contact information:   [address] [place] [address] [phone]       Follow up  with Melvia Heaps, MD. Schedule an appointment as soon as possible for a visit in 1 month. (Gastroenterology)    Specialty:  Gastroenterology   Contact information:   [address] [place] [address] [phone]       Follow up with [facility]. (As needed.  Walk-ins welcome)    Contact information:   [address] [place] [address]       The results of significant diagnostics from this hospitalization (including imaging, microbiology, ancillary and laboratory) are listed below for reference.    Significant Diagnostic Studies: Dg Thoracic Spine 2 View  01/30/2014   CLINICAL DATA:  Fall, back pain  EXAM: THORACIC SPINE - 2 VIEW  COMPARISON:  12/30/2013 and CT chest 12/15/2012  FINDINGS: Three views of thoracic spine submitted. No acute fracture or subluxation. Alignment, disc spaces and vertebral heights are preserved.  IMPRESSION: Negative.   Electronically Signed   By: Natasha Mead M.D.   On: 01/30/2014 19:35   Dg Lumbar Spine Complete  01/31/2014   CLINICAL DATA:  Diffuse lower back pain after falling backwards while walking. Patient's legs gave out. Did not hit head. Initial encounter.  EXAM: LUMBAR SPINE - COMPLETE 4+ VIEW  COMPARISON:  Lumbar spine radiographs performed 10/01/2013  FINDINGS: There is no evidence of fracture or subluxation. Vertebral bodies demonstrate normal height and alignment. Intervertebral disc spaces are preserved. The visualized neural foramina are grossly unremarkable in appearance.  The visualized bowel gas pattern is unremarkable in appearance; air and stool are noted within the colon. The sacroiliac joints are within normal limits.  IMPRESSION: No evidence of fracture or subluxation along the lumbar spine.   Electronically Signed   By: Roanna Raider M.D.   On: 01/31/2014 00:13   Ct Thoracic Spine Wo Contrast  01/31/2014   CLINICAL DATA:  Lost balance and fell backward while walking in kitchen. Landed on back and  buttocks, with severe diffuse back pain and right leg numbness. Cannot ambulate. Initial encounter.  EXAM: CT THORACIC AND LUMBAR SPINE WITHOUT CONTRAST  TECHNIQUE: Multidetector CT imaging of the thoracic and lumbar spine was performed without contrast. Multiplanar CT image reconstructions were also generated.  COMPARISON:  Lumbar and thoracic spine radiographs performed 01/30/2014  FINDINGS: CT THORACIC SPINE FINDINGS  There is no evidence of fracture or subluxation along the thoracic spine. Vertebral bodies demonstrate normal height and alignment. Intervertebral disc spaces are preserved. The bony foramina are unremarkable in appearance bilaterally. There is no evidence of foraminal narrowing.  The thyroid gland  is unremarkable. The visualized portions of the mediastinum are within normal limits. Minimal bilateral atelectasis is noted.  The paraspinal musculature is unremarkable in appearance.  CT LUMBAR SPINE FINDINGS  There is no evidence of fracture or subluxation along the lumbar spine. Vertebral bodies demonstrate normal height and alignment. Intervertebral disc spaces are preserved. The bony foramina are unremarkable in appearance bilaterally.  There may be mild bulging of the disc at L4-L5 and L5-S1 on the right side, with mild associated foraminal narrowing. At L5-S1, this may impress on the exiting nerve root; would correlate with the level of the patient's symptoms. Minimal facet disease is noted at the lower lumbar spine.  Visualized bowel structures are unremarkable in appearance. Postoperative change is noted about the cecum. The visualized portions of the kidneys are unremarkable. No vascular calcification is seen.  The paraspinal musculature is unremarkable in appearance.  IMPRESSION: 1. No evidence of fracture or subluxation along the thoracic or lumbar spine. 2. There may be mild bulging of the disc at L4-L5 and L5-S1 on the right side, with mild associated foraminal narrowing. At L5-S1, this may  impress on the exiting nerve root. Would correlate with the level of the patient's symptoms.   Electronically Signed   By: Roanna Raider M.D.   On: 01/31/2014 01:46   Ct Lumbar Spine Wo Contrast  01/31/2014   CLINICAL DATA:  Lost balance and fell backward while walking in kitchen. Landed on back and buttocks, with severe diffuse back pain and right leg numbness. Cannot ambulate. Initial encounter.  EXAM: CT THORACIC AND LUMBAR SPINE WITHOUT CONTRAST  TECHNIQUE: Multidetector CT imaging of the thoracic and lumbar spine was performed without contrast. Multiplanar CT image reconstructions were also generated.  COMPARISON:  Lumbar and thoracic spine radiographs performed 01/30/2014  FINDINGS: CT THORACIC SPINE FINDINGS  There is no evidence of fracture or subluxation along the thoracic spine. Vertebral bodies demonstrate normal height and alignment. Intervertebral disc spaces are preserved. The bony foramina are unremarkable in appearance bilaterally. There is no evidence of foraminal narrowing.  The thyroid gland is unremarkable. The visualized portions of the mediastinum are within normal limits. Minimal bilateral atelectasis is noted.  The paraspinal musculature is unremarkable in appearance.  CT LUMBAR SPINE FINDINGS  There is no evidence of fracture or subluxation along the lumbar spine. Vertebral bodies demonstrate normal height and alignment. Intervertebral disc spaces are preserved. The bony foramina are unremarkable in appearance bilaterally.  There may be mild bulging of the disc at L4-L5 and L5-S1 on the right side, with mild associated foraminal narrowing. At L5-S1, this may impress on the exiting nerve root; would correlate with the level of the patient's symptoms. Minimal facet disease is noted at the lower lumbar spine.  Visualized bowel structures are unremarkable in appearance. Postoperative change is noted about the cecum. The visualized portions of the kidneys are unremarkable. No vascular  calcification is seen.  The paraspinal musculature is unremarkable in appearance.  IMPRESSION: 1. No evidence of fracture or subluxation along the thoracic or lumbar spine. 2. There may be mild bulging of the disc at L4-L5 and L5-S1 on the right side, with mild associated foraminal narrowing. At L5-S1, this may impress on the exiting nerve root. Would correlate with the level of the patient's symptoms.   Electronically Signed   By: Roanna Raider M.D.   On: 01/31/2014 01:46   Mr Lumbar Spine Wo Contrast  01/31/2014   CLINICAL DATA:  Low back and right leg pain and numbness.  Patient fell yesterday in her kitchen.  EXAM: MRI LUMBAR SPINE WITHOUT CONTRAST  TECHNIQUE: Multiplanar, multisequence MR imaging of the lumbar spine was performed. No intravenous contrast was administered.  COMPARISON:  CT scan 01/31/2014  FINDINGS: Normal alignment of the lumbar vertebral bodies. They demonstrate normal marrow signal. No acute fracture or bone lesion. The last full intervertebral disc space is labeled L5-S1 and the conus medullaris terminates at L2. There is small disc space at S1-2. The facets are normally aligned. No pars defects. No significant paraspinal or retroperitoneal findings are demonstrated.  L1-2:  No significant findings.  L2-3:  No significant findings.  L3-4:  No significant findings.  L4-5: Right foraminal/extra foraminal disc protrusion comes close to contacting and could irritate the right L4 nerve root. No spinal or lateral recess stenosis.  L5-S1: Shallow right foraminal disc protrusion comes close to contacting the right L5 nerve root and certainly could irritated. No spinal stenosis. Mild facet disease.  IMPRESSION: Right foraminal disc protrusions at L4-5 and L5-S1.  No acute bony findings.  The conus medullaris terminates at the bottom of L2.   Electronically Signed   By: Loralie Champagne M.D.   On: 01/31/2014 16:18    Microbiology: No results found for this or any previous visit (from the past  240 hour(s)).   Labs: Basic Metabolic Panel:  Recent Labs Lab 01/30/14 2222 01/31/14 0555 02/01/14 0423 02/02/14 0550  NA 144 142 141 136*  K 3.3* 3.7 4.4 4.8  CL 110 109 105 99  CO2  --  24 25 27   GLUCOSE 78 120* 102* 129*  BUN 19 17 9 11   CREATININE 0.70 0.67 0.57 0.52  CALCIUM  --  8.1* 9.0 9.8   Liver Function Tests: No results found for this basename: AST, ALT, ALKPHOS, BILITOT, PROT, ALBUMIN,  in the last 168 hours No results found for this basename: LIPASE, AMYLASE,  in the last 168 hours No results found for this basename: AMMONIA,  in the last 168 hours CBC:  Recent Labs Lab 01/30/14 2211 01/30/14 2222 01/31/14 0555 02/01/14 0423 02/02/14 0550  WBC 9.7  --  9.4 7.9 10.0  NEUTROABS 5.1  --   --   --   --   HGB 11.4* 12.6 10.5* 10.7* 11.8*  HCT 35.8* 37.0 32.7* 34.4* 35.7*  MCV 87.1  --  87.2 87.5 86.0  PLT 310  --  308 309 366   Cardiac Enzymes: No results found for this basename: CKTOTAL, CKMB, CKMBINDEX, TROPONINI,  in the last 168 hours BNP: BNP (last 3 results) No results found for this basename: PROBNP,  in the last 8760 hours CBG: No results found for this basename: GLUCAP,  in the last 168 hours  Time coordinating discharge: 45 minutes  Signed:  Haidyn Kilburg  [facility] 02/02/2014, 12:19 PM

## 2014-02-01 NOTE — Progress Notes (Signed)
Clinical Social Work  CSW received referral to complete psychosocial assessment. Bedside RN reports patient currently working with OT and then will have foley placed. CSW will follow up at later time.  Unk Lightning, LCSW (Coverage for [employer])

## 2014-02-01 NOTE — Progress Notes (Signed)
UR completed 

## 2014-02-02 ENCOUNTER — Encounter (HOSPITAL_COMMUNITY): Payer: Self-pay | Admitting: Internal Medicine

## 2014-02-02 LAB — BASIC METABOLIC PANEL
Anion gap: 10 (ref 5–15)
BUN: 11 mg/dL (ref 6–23)
CO2: 27 meq/L (ref 19–32)
Calcium: 9.8 mg/dL (ref 8.4–10.5)
Chloride: 99 mEq/L (ref 96–112)
Creatinine, Ser: 0.52 mg/dL (ref 0.50–1.10)
GFR calc Af Amer: >90 mL/min (ref >90)
GFR calc non Af Amer: >90 mL/min (ref >90)
GLUCOSE: 129 mg/dL — AB (ref 70–99)
POTASSIUM: 4.8 meq/L (ref 3.7–5.3)
Sodium: 136 mEq/L — ABNORMAL LOW (ref 137–147)

## 2014-02-02 LAB — CBC
HCT: 35.7 % — ABNORMAL LOW (ref 36.0–46.0)
Hemoglobin: 11.8 g/dL — ABNORMAL LOW (ref 12.0–15.0)
MCH: 28.4 pg (ref 26.0–34.0)
MCHC: 33.1 g/dL (ref 30.0–36.0)
MCV: 86 fL (ref 78.0–100.0)
Platelets: 366 10*3/uL (ref 150–400)
RBC: 4.15 MIL/uL (ref 3.87–5.11)
RDW: 13.7 % (ref 11.5–15.5)
WBC: 10 10*3/uL (ref 4.0–10.5)

## 2014-02-02 LAB — CORTISOL-AM, BLOOD

## 2014-02-02 MED ORDER — METHYLPREDNISOLONE (PAK) 4 MG PO TABS: ORAL_TABLET | ORAL | Status: DC

## 2014-02-02 MED ORDER — COSYNTROPIN 0.25 MG IJ SOLR: 0.2500 mg | Freq: Once | INTRAMUSCULAR | Status: DC

## 2014-02-02 MED ORDER — IBUPROFEN 800 MG PO TABS
800.0000 mg | ORAL_TABLET | Freq: Three times a day (TID) | ORAL | Status: DC
  Administered 2014-02-02 – 2014-02-04 (×5): 800 mg via ORAL
  Filled 2014-02-02 (×9): qty 1

## 2014-02-02 MED ORDER — POLYETHYLENE GLYCOL 3350 17 GM/SCOOP PO POWD: 17.0000 g | Freq: Every day | ORAL | Status: DC

## 2014-02-02 MED ORDER — HYDROCORTISONE 10 MG PO TABS
10.0000 mg | ORAL_TABLET | Freq: Every day | ORAL | Status: DC
Start: 2014-02-02 — End: 2014-02-04
  Administered 2014-02-02 – 2014-02-03 (×2): 10 mg via ORAL
  Filled 2014-02-02 (×3): qty 1

## 2014-02-02 MED ORDER — HYDROCORTISONE 10 MG PO TABS: ORAL_TABLET | ORAL | Status: DC

## 2014-02-02 MED ORDER — BISACODYL 10 MG RE SUPP: 10.0000 mg | Freq: Every day | RECTAL | Status: DC | PRN

## 2014-02-02 MED ORDER — PANTOPRAZOLE SODIUM 40 MG PO TBEC
40.0000 mg | DELAYED_RELEASE_TABLET | Freq: Every day | ORAL | Status: DC
  Administered 2014-02-03 – 2014-02-04 (×2): 40 mg via ORAL
  Filled 2014-02-02 (×2): qty 1

## 2014-02-02 MED ORDER — FERROUS SULFATE 325 (65 FE) MG PO TBEC: 325.0000 mg | DELAYED_RELEASE_TABLET | Freq: Three times a day (TID) | ORAL | Status: DC

## 2014-02-02 MED ORDER — VITAMIN B-12 1000 MCG PO TABS: 1000.0000 ug | ORAL_TABLET | Freq: Every day | ORAL | Status: DC

## 2014-02-02 MED ORDER — TAMSULOSIN HCL 0.4 MG PO CAPS: 0.4000 mg | ORAL_CAPSULE | Freq: Every day | ORAL | Status: DC

## 2014-02-02 MED ORDER — HYDROCORTISONE 5 MG PO TABS
15.0000 mg | ORAL_TABLET | Freq: Every day | ORAL | Status: DC
  Administered 2014-02-02 – 2014-02-04 (×3): 15 mg via ORAL
  Filled 2014-02-02 (×5): qty 1

## 2014-02-02 NOTE — Care Management Note (Addendum)
    Page 1 of 2   02/04/2014     12:45:15 PM CARE MANAGEMENT NOTE 02/04/2014  Patient:  Denise Ellis, Denise Ellis   Account Number:  [id number]  Date Initiated:  02/01/2014  Documentation initiated by:  Lorenda Ishihara  Subjective/Objective Assessment:   33 yo female admitted with fall, LE numbness. PTA lived at home with spouse.     Action/Plan:   Home when stable,   Anticipated DC Date:  02/02/2014   Anticipated DC Plan:  HOME W HOME HEALTH SERVICES  In-house referral  Clinical Social Worker      DC Associate Professor  CM consult      [facility] Choice  HOME HEALTH  DURABLE MEDICAL EQUIPMENT   Choice offered to / List presented to:  C-1 Patient   DME arranged  3-N-1  WALKER - ROLLING  WHEELCHAIR - MANUAL      DME agency  [facility]     HH arranged  HH-2 PT  HH-10 DISEASE MANAGEMENT  HH-1 RN  HH-6 SOCIAL WORKER  HH-4 NURSE'S AIDE  HH-3 OT      [facility] agency  [facility]   Status of service:  Completed, signed off Medicare Important Message given?  YES (If response is "NO", the following Medicare IM given date fields will be blank) Date Medicare IM given:  02/02/2014 Medicare IM given by:  [employer] Date Additional Medicare IM given:   Additional Medicare IM given by:    Discharge Disposition:  HOME W HOME HEALTH SERVICES  Per UR Regulation:  Reviewed for med. necessity/level of care/duration of stay  If discussed at Long Length of Stay Meetings, dates discussed:    Comments:  02-04-14 Lorenda Ishihara RN CM Patient provided with information on [facility] per request, refused resources on private duty agencies.  02-01-14 Lorenda Ishihara RN CM 1500 Spoke with patient at bedside, she is concerned about safety at home since her husband works during the day. Discussed option of SNF for short term rehab but patient was adamant she was not going there. Discussed what HH could offer and what was covered by insurance. Offered private duty list but patient  states unable to afford out of pocket expenses. Patient appeared somewhat depressed and referred to a traumatic event that lead to her decline. She stated she did not see at therapist in the community and was unsure of available resources. Contacted CSW to f/u with patient and offer resources.

## 2014-02-02 NOTE — ED Provider Notes (Signed)
Medical screening examination/treatment/procedure(s) were conducted as a shared visit with non-physician practitioner(s) and myself.  I personally evaluated the patient during the encounter.   EKG Interpretation None       Doug Sou, MD 02/02/14 [phone]

## 2014-02-02 NOTE — Progress Notes (Signed)
Clinical Social Work  CSW spoke with CM re: patient changing her mind about wanting SNF placement now. Patient has only had a 1 night inpatient stay for Medicare so it was explained to patient that Medicare would not pay for SNF placement. Patient upset and yelling at CSW. Patient reports that no one should have told her SNF was an option if Medicare would not pay for it. Patient believes that hospital filled out paperwork incorrectly on admission and it is the hospital's fault that she cannot go to SNF now. CSW explained that CSW could assist with placement but we just wanted her to be aware that Medicare would not cover stay. CSW explained that Medicaid would cover LT placement and she would supplement payment with disability/social security check. Patient is not agreeable to this plan and reports she will DC home with [facility]. RN reports husband called and wanted to speak with CSW. Patient agreeable for CSW to speak with husband so CSW called husband. CSW explained DC process along with Medicare not covering SNF placement. Husband upset with CSW and reports that CSW is not being helpful. CSW explained option for Medicaid placement but husband reports this is not an option either.   Husband and patient report no further needs and patient reports CSW is not helping her and she does not want any further visits. Patient reports she does not want to discuss depression with CSW and ended conversation.   CSW is signing off but available if needed.  Unk Lightning, LCSW (Coverage for [employer])

## 2014-02-02 NOTE — Progress Notes (Signed)
Occupational Therapy Treatment Patient Details Name: Denise Ellis MRN: [id number] DOB: [birth year] Today's Date: 02/02/2014    History of present illness Denise Ellis is a 33 y.o. Caucasian female adm after fall at home that occured while she was walking to the kitchen she lost her balance and fell backwards and landed on R buttock and  back.  She had severe pain in her back and numbness in her right leg.  She was not able to get up.    PMHx:degenerative disc disease, depression, anxiety, and chronic right leg pain  PMHx:degenerative disc disease, depression, anxiety, and chronic right leg pain . xrays grossly negative for thoracic and lumbar spine, MRI reports Right foraminal disc protrusions at L4-5 and L5-S1.   OT comments  Reinforced back precautions and issued AE (medicaid is secondary insurance) Pt is limited by pain  Follow Up Recommendations  Home health OT;Supervision - Intermittent    Equipment Recommendations  3 in 1 bedside comode;Tub/shower seat    Recommendations for Other Services      Precautions / Restrictions Precautions Precautions: Back Restrictions Weight Bearing Restrictions: No       Mobility Bed Mobility         Supine to sit: Supervision Sit to supine: Supervision   General bed mobility comments: educated on back precautions  Transfers                 General transfer comment: not done this session    Balance                                   ADL               Lower Body Bathing: Set up;Sit to/from stand;With adaptive equipment       Lower Body Dressing: Minimal assistance;Sit to/from stand;With adaptive equipment                 General ADL Comments: pt did not wish to perform ADL this am.  Reviewed AE for adls and pt practiced with this. She needs additional practice with sock aide.  Issued reacher, sock aide, long sponge and toilet aide.  Pt had just returned from bathroom.  Reinforced back  precautions and  worked on bed [employer].  She will be alone for extended periods.  Talked about food at bedside, [facility] if necessary in front of bedside table or other sturdy furniture (with pine cleaner at bottom).  Also discussed showering when husband is home      Vision                     Perception     Praxis      Cognition   Behavior During Therapy: WFL for tasks assessed/performed Overall Cognitive Status: Within Functional Limits for tasks assessed                       Extremity/Trunk Assessment               Exercises     Shoulder Instructions       General Comments      Pertinent Vitals/ Pain       Pain Score: 8  Pain Location: back Pain Intervention(s): Limited activity within patient's tolerance;Monitored during session;Repositioned;Premedicated before session  Home Living  Prior Functioning/Environment              Frequency Min 2X/week     Progress Toward Goals  OT Goals(current goals can now be found in the care plan section)  Progress towards OT goals: Progressing toward goals     Plan      Co-evaluation                 End of Session     Activity Tolerance Patient limited by pain   Patient Left in bed;with call bell/phone within reach;with family/visitor present   Nurse Communication          Time: [id number] OT Time Calculation (min): 23 min  Charges: OT General Charges $OT Visit: 1 Procedure OT Treatments $Self Care/Home Management : 23-37 mins  Olivier Frayre 02/02/2014, 9:23 AM Marica OtterMaryellen Rennie Hack, OTR/L [phone] 02/02/2014

## 2014-02-03 ENCOUNTER — Inpatient Hospital Stay (HOSPITAL_COMMUNITY): Payer: Medicare Other

## 2014-02-03 LAB — CBC
HCT: 36.8 % (ref 36.0–46.0)
Hemoglobin: 11.4 g/dL — ABNORMAL LOW (ref 12.0–15.0)
MCH: 27.4 pg (ref 26.0–34.0)
MCHC: 31 g/dL (ref 30.0–36.0)
MCV: 88.5 fL (ref 78.0–100.0)
PLATELETS: 332 10*3/uL (ref 150–400)
RBC: 4.16 MIL/uL (ref 3.87–5.11)
RDW: 14.2 % (ref 11.5–15.5)
WBC: 11.7 10*3/uL — ABNORMAL HIGH (ref 4.0–10.5)

## 2014-02-03 LAB — BASIC METABOLIC PANEL
Anion gap: 10 (ref 5–15)
BUN: 12 mg/dL (ref 6–23)
CO2: 28 mEq/L (ref 19–32)
Calcium: 9.2 mg/dL (ref 8.4–10.5)
Chloride: 100 mEq/L (ref 96–112)
Creatinine, Ser: 0.69 mg/dL (ref 0.50–1.10)
Glucose, Bld: 113 mg/dL — ABNORMAL HIGH (ref 70–99)
POTASSIUM: 4.9 meq/L (ref 3.7–5.3)
Sodium: 138 mEq/L (ref 137–147)

## 2014-02-03 NOTE — Progress Notes (Signed)
PROGRESS NOTE  Denise Ellis ZOX:[id number] DOB: 11-28-1980 DOA: 01/30/2014 PCP: Marin Comment, FNP  Assessment/Plan: Fall with right leg numbness. Pt reported she has some baseline numbness of the right leg, but that after her fall she developed complete inability to feel anything in that leg. Initially she had a hard time telling me how high the numbness went, but later stated it was to the level of the gluteal crease. She also endorsed saddle anesthesia. Objectively, however, she had intact sensation and no hemineglect. She has chronic RUE and RLE 4/5 strength which is unchanged per her report. CT demonstrated no fracture or subluxation but she had mild bulging disk at L4-5 and L5-S1 on the right with mild foraminal narrowing. At L5-S1 the nerve root may be compressed. Her findings did not explain her reported saddle anesthesia or her urinary retention. Her case was discussed with Dr. Lovell Sheehan from NSGY who recommend MRI which confirmed right foraminal disc protrusions at L4-5 and L5-S1. The disc protrusions came close to contacting the L4 and L5 nerve roots which could cause irritation, but there was no compression or spinal stenosis. She was started on a Medrol Dosepak and was seen by physical and occupational therapy who recommended skilled nursing facility. Initially, she adamantly declined going to rehabilitation, however she later changed her mind. Because she has been under observation, as she would have to pay out of pocket to go to skilled nursing facility. She states she is unable to afford this and would prefer to go home. She will be set up with home health services including a social worker to assist her with escalation to a skilled nursing facility at a later time, if needed. She was ordered a 3 and 1,/shower seat, a rolling walker.  -on 02/03/14, pt had an unwitnessed mechanical fall in her hospital room--the patient was found sitting on the floor by nursing staff -Patient  claimed that she was trying to put her pants on while sitting on the edge of her bed and she slipped and fell onto her buttocks -X-rays of the lumbar spine were obtained after a mechanical fall as the patient complained of increased lower back pain, but stated that right lower extremity weakness and decreased sensation have been unchanged -02/03/14--lumbar xrays neg Chronic pain, followed by pain clinic in [place]. Patient has been on long-term pain management, however, she was unable to accurately report her medications. Per her PCP's office, she does not walk with a cane or limp and they were unaware of some her narcotic medications that she was being prescribed by her pain clinic. Her pain clinic reports that the patient has reported her narcotic presciptions missing, called saying her medication was lost or flushed accidentally. She has presented to multiple ERs requesting additional pain medication due to falls and her pain clinic has had to remind her that that is not allowed under her pain contract. In fact, she has had two visits to the [facility] ER in the last two months for similar reasons. Her pain clinic was able to schedule her an appointment with [name], [phone], for the day after discharge 10/14 at 11AM. She is on MS contin 60mg  BID and oxycodone 10mg  four times daily, prescribed by them, and they asked that she NOT receive any additional controlled substance prescriptions at discharge.  Acute urinary retention. During her previous ER visits, she has had some difficulty urinating also. Suspect this may be due to her high dose  narcotics, worsened by the escalation in her dose during this admission. She foley catheter placed with 500 mL of urine removed. She was started on flomax and her foley was removed a day later and she was able to void spontaneously.  Hypotension possibly due to dehydration or adrenal insufficiency, improved somewhat with IVF, however, she has consistently had  low BPs during her ER visits also. Her cortisol level was <0.5. Unfortunately, she had are ready been started on steroids before performing a cosyntropin stim test to confirm the diagnosis. Recommend that she complete her steroid taper and then start hydrocortisone 50 mg q. a.m. and 10 mg after lunch. She will need to follow up with an endocrinologist within one month for further testing to confirm the diagnosis.  Depression and anxiety and PTSD probably contributing greatly to her pain and generalized illness/weakness. She is not on SSRI but reports she is on high dose clonazepam. Patient refuses fluoxetine and states "I have been on all of those medications." States her anxiety is managed by her PCP. Per her PCP, she refuses to see a psychiatrist or attend counseling and she refuses any medications other than benzodiazepines for treatment of her anxiety. Clonazepam 2mg  TID was confirmed by PCP's office.  I question her reported diagnosis of COPD. She may have some asthma, and her lungs remained clear. Continued prn albuterol. Outpatient PFTs to confirm diagnosis.  Mild Crohn's disease. The patient has been seen in the ER twice in the last two months prior to this admission and has gotten two CT abd/pelvis. The first showed mild proctitis and the second was normal. 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 Recommend GI follow up for confirmatory endoscopy and miralax with colace and senna to keep bowels regular in the meantime.  Constipation, likely due to narcotics. Started colace, senna, and schedule miralax  Normocytic anemia, Iron studies c/w mild iron deficiency, b12 273, folate 754, TSH 1.9, occult stool. Recommend starting iron and vitamin B12 supplementation.  Consultants:  Spoke with Dr. Lovell Sheehan, NSGY by phone Procedures:  CT thoracic and lumbar spine  MRI lumbar spine Antibiotics:  none    Family Communication:   Pt at  beside Disposition Plan:   Home when medically stable       Procedures/Studies: Dg Thoracic Spine 2 View  01/30/2014   CLINICAL DATA:  Fall, back pain  EXAM: THORACIC SPINE - 2 VIEW  COMPARISON:  12/30/2013 and CT chest 12/15/2012  FINDINGS: Three views of thoracic spine submitted. No acute fracture or subluxation. Alignment, disc spaces and vertebral heights are preserved.  IMPRESSION: Negative.   Electronically Signed   By: Natasha Mead M.D.   On: 01/30/2014 19:35   Dg Lumbar Spine Complete  01/31/2014   CLINICAL DATA:  Diffuse lower back pain after falling backwards while walking. Patient's legs gave out. Did not hit head. Initial encounter.  EXAM: LUMBAR SPINE - COMPLETE 4+ VIEW  COMPARISON:  Lumbar spine radiographs performed 10/01/2013  FINDINGS: There is no evidence of fracture or subluxation. Vertebral bodies demonstrate normal height and alignment. Intervertebral disc spaces are preserved. The visualized neural foramina are grossly unremarkable in appearance.  The visualized bowel gas pattern is unremarkable in appearance; air and stool are noted within the colon. The sacroiliac joints are within normal limits.  IMPRESSION: No evidence of fracture or subluxation along the lumbar spine.   Electronically Signed   By: Roanna Raider M.D.   On: 01/31/2014 00:13  Ct Thoracic Spine Wo Contrast  01/31/2014   CLINICAL DATA:  Lost balance and fell backward while walking in kitchen. Landed on back and buttocks, with severe diffuse back pain and right leg numbness. Cannot ambulate. Initial encounter.  EXAM: CT THORACIC AND LUMBAR SPINE WITHOUT CONTRAST  TECHNIQUE: Multidetector CT imaging of the thoracic and lumbar spine was performed without contrast. Multiplanar CT image reconstructions were also generated.  COMPARISON:  Lumbar and thoracic spine radiographs performed 01/30/2014  FINDINGS: CT THORACIC SPINE FINDINGS  There is no evidence of fracture or subluxation along the thoracic spine.  Vertebral bodies demonstrate normal height and alignment. Intervertebral disc spaces are preserved. The bony foramina are unremarkable in appearance bilaterally. There is no evidence of foraminal narrowing.  The thyroid gland is unremarkable. The visualized portions of the mediastinum are within normal limits. Minimal bilateral atelectasis is noted.  The paraspinal musculature is unremarkable in appearance.  CT LUMBAR SPINE FINDINGS  There is no evidence of fracture or subluxation along the lumbar spine. Vertebral bodies demonstrate normal height and alignment. Intervertebral disc spaces are preserved. The bony foramina are unremarkable in appearance bilaterally.  There may be mild bulging of the disc at L4-L5 and L5-S1 on the right side, with mild associated foraminal narrowing. At L5-S1, this may impress on the exiting nerve root; would correlate with the level of the patient's symptoms. Minimal facet disease is noted at the lower lumbar spine.  Visualized bowel structures are unremarkable in appearance. Postoperative change is noted about the cecum. The visualized portions of the kidneys are unremarkable. No vascular calcification is seen.  The paraspinal musculature is unremarkable in appearance.  IMPRESSION: 1. No evidence of fracture or subluxation along the thoracic or lumbar spine. 2. There may be mild bulging of the disc at L4-L5 and L5-S1 on the right side, with mild associated foraminal narrowing. At L5-S1, this may impress on the exiting nerve root. Would correlate with the level of the patient's symptoms.   Electronically Signed   By: Roanna Raider M.D.   On: 01/31/2014 01:46   Ct Lumbar Spine Wo Contrast  01/31/2014   CLINICAL DATA:  Lost balance and fell backward while walking in kitchen. Landed on back and buttocks, with severe diffuse back pain and right leg numbness. Cannot ambulate. Initial encounter.  EXAM: CT THORACIC AND LUMBAR SPINE WITHOUT CONTRAST  TECHNIQUE: Multidetector CT imaging of  the thoracic and lumbar spine was performed without contrast. Multiplanar CT image reconstructions were also generated.  COMPARISON:  Lumbar and thoracic spine radiographs performed 01/30/2014  FINDINGS: CT THORACIC SPINE FINDINGS  There is no evidence of fracture or subluxation along the thoracic spine. Vertebral bodies demonstrate normal height and alignment. Intervertebral disc spaces are preserved. The bony foramina are unremarkable in appearance bilaterally. There is no evidence of foraminal narrowing.  The thyroid gland is unremarkable. The visualized portions of the mediastinum are within normal limits. Minimal bilateral atelectasis is noted.  The paraspinal musculature is unremarkable in appearance.  CT LUMBAR SPINE FINDINGS  There is no evidence of fracture or subluxation along the lumbar spine. Vertebral bodies demonstrate normal height and alignment. Intervertebral disc spaces are preserved. The bony foramina are unremarkable in appearance bilaterally.  There may be mild bulging of the disc at L4-L5 and L5-S1 on the right side, with mild associated foraminal narrowing. At L5-S1, this may impress on the exiting nerve root; would correlate with the level of the patient's symptoms. Minimal facet disease is noted at the lower lumbar spine.  Visualized bowel structures are unremarkable in appearance. Postoperative change is noted about the cecum. The visualized portions of the kidneys are unremarkable. No vascular calcification is seen.  The paraspinal musculature is unremarkable in appearance.  IMPRESSION: 1. No evidence of fracture or subluxation along the thoracic or lumbar spine. 2. There may be mild bulging of the disc at L4-L5 and L5-S1 on the right side, with mild associated foraminal narrowing. At L5-S1, this may impress on the exiting nerve root. Would correlate with the level of the patient's symptoms.   Electronically Signed   By: Roanna RaiderJeffery  Chang M.D.   On: 01/31/2014 01:46   Mr Lumbar Spine Wo  Contrast  01/31/2014   CLINICAL DATA:  Low back and right leg pain and numbness. Patient fell yesterday in her kitchen.  EXAM: MRI LUMBAR SPINE WITHOUT CONTRAST  TECHNIQUE: Multiplanar, multisequence MR imaging of the lumbar spine was performed. No intravenous contrast was administered.  COMPARISON:  CT scan 01/31/2014  FINDINGS: Normal alignment of the lumbar vertebral bodies. They demonstrate normal marrow signal. No acute fracture or bone lesion. The last full intervertebral disc space is labeled L5-S1 and the conus medullaris terminates at L2. There is small disc space at S1-2. The facets are normally aligned. No pars defects. No significant paraspinal or retroperitoneal findings are demonstrated.  L1-2:  No significant findings.  L2-3:  No significant findings.  L3-4:  No significant findings.  L4-5: Right foraminal/extra foraminal disc protrusion comes close to contacting and could irritate the right L4 nerve root. No spinal or lateral recess stenosis.  L5-S1: Shallow right foraminal disc protrusion comes close to contacting the right L5 nerve root and certainly could irritated. No spinal stenosis. Mild facet disease.  IMPRESSION: Right foraminal disc protrusions at L4-5 and L5-S1.  No acute bony findings.  The conus medullaris terminates at the bottom of L2.   Electronically Signed   By: Loralie ChampagneMark  Gallerani M.D.   On: 01/31/2014 16:18         Subjective: Patient had a mechanical fall today that was unwitnessed. She denies any syncope, chest pain, shortness breath, nausea, vomiting, diarrhea. She denies any fevers or chills. She complains of some increased lower back pain.  Objective: Filed Vitals:   02/02/14 [id number] 02/02/14 2212 02/03/14 0521 02/03/14 1250  BP:  109/63 111/68 145/86  Pulse:  84 53 134  Temp:  98.5 F (36.9 C) 97.8 F (36.6 C) 98.1 F (36.7 C)  TempSrc:  Oral Oral Oral  Resp:  17 16 24   Height:      Weight: 102.7 kg (226 lb 6.6 oz)     SpO2:  97% 97% 99%    Intake/Output  Summary (Last 24 hours) at 02/03/14 1314 Last data filed at 02/03/14 1259  Gross per 24 hour  Intake    240 ml  Output   1100 ml  Net   -860 ml   Weight change:  Exam:   General:  Pt is alert, follows commands appropriately, not in acute distress  HEENT: No icterus, No thrush,  [place]/AT  Cardiovascular: RRR, S1/S2, no rubs, no gallops  Respiratory: CTA bilaterally, no wheezing, no crackles, no rhonchi  Abdomen: Soft/+BS, non tender, non distended, no guarding  Extremities: trace edema, No lymphangitis, No petechiae, No rashes, no synovitis;  Lower back without any visible trauma or skin tear  Data Reviewed: Basic Metabolic Panel:  Recent Labs Lab 01/30/14 2222 01/31/14 0555 02/01/14 0423 02/02/14 0550 02/03/14 0513  NA 144 142 141 136* 138  K 3.3* 3.7 4.4 4.8 4.9  CL 110 109 105 99 100  CO2  --  24 25 27 28   GLUCOSE 78 120* 102* 129* 113*  BUN 19 17 9 11 12   CREATININE 0.70 0.67 0.57 0.52 0.69  CALCIUM  --  8.1* 9.0 9.8 9.2   Liver Function Tests: No results found for this basename: AST, ALT, ALKPHOS, BILITOT, PROT, ALBUMIN,  in the last 168 hours No results found for this basename: LIPASE, AMYLASE,  in the last 168 hours No results found for this basename: AMMONIA,  in the last 168 hours CBC:  Recent Labs Lab 01/30/14 2211 01/30/14 2222 01/31/14 0555 02/01/14 0423 02/02/14 0550 02/03/14 0513  WBC 9.7  --  9.4 7.9 10.0 11.7*  NEUTROABS 5.1  --   --   --   --   --   HGB 11.4* 12.6 10.5* 10.7* 11.8* 11.4*  HCT 35.8* 37.0 32.7* 34.4* 35.7* 36.8  MCV 87.1  --  87.2 87.5 86.0 88.5  PLT 310  --  308 309 366 332   Cardiac Enzymes: No results found for this basename: CKTOTAL, CKMB, CKMBINDEX, TROPONINI,  in the last 168 hours BNP: No components found with this basename: POCBNP,  CBG: No results found for this basename: GLUCAP,  in the last 168 hours  No results found for this or any previous visit (from the past 240 hour(s)).   Scheduled Meds: .  clonazePAM  2 mg Oral TID  . docusate sodium  100 mg Oral BID  . enoxaparin (LOVENOX) injection  40 mg Subcutaneous Q24H  . FLUoxetine  20 mg Oral QHS  . hydrocortisone  10 mg Oral Daily  . hydrocortisone  15 mg Oral Daily  . hydrOXYzine  50 mg Oral BID  . ibuprofen  800 mg Oral TID  . methylPREDNISolone  4 mg Oral 4X daily taper  . morphine  60 mg Oral Q12H  . pantoprazole  40 mg Oral Daily  . polyethylene glycol  17 g Oral Daily  . senna  2 tablet Oral QHS  . tamsulosin  0.4 mg Oral QPC supper   Continuous Infusions:    Denise Mckeon, DO  [facility] Pager [phone]  If 7PM-7AM, please contact night-coverage [email or web address] Password [email or web address] 02/03/2014, 1:14 PM   LOS: 4 days

## 2014-02-03 NOTE — Progress Notes (Signed)
Patient found on floor by NT. Assisted to chair, VS stable.  No injury noticed. MD notified new orders obtained.

## 2014-02-04 LAB — CBC
HEMATOCRIT: 34.2 % — AB (ref 36.0–46.0)
Hemoglobin: 10.8 g/dL — ABNORMAL LOW (ref 12.0–15.0)
MCH: 28.1 pg (ref 26.0–34.0)
MCHC: 31.6 g/dL (ref 30.0–36.0)
MCV: 88.8 fL (ref 78.0–100.0)
PLATELETS: 322 10*3/uL (ref 150–400)
RBC: 3.85 MIL/uL — ABNORMAL LOW (ref 3.87–5.11)
RDW: 14.2 % (ref 11.5–15.5)
WBC: 13.6 10*3/uL — AB (ref 4.0–10.5)

## 2014-02-04 LAB — BASIC METABOLIC PANEL
ANION GAP: 11 (ref 5–15)
BUN: 14 mg/dL (ref 6–23)
CALCIUM: 9 mg/dL (ref 8.4–10.5)
CO2: 27 mEq/L (ref 19–32)
Chloride: 100 mEq/L (ref 96–112)
Creatinine, Ser: 0.66 mg/dL (ref 0.50–1.10)
Glucose, Bld: 115 mg/dL — ABNORMAL HIGH (ref 70–99)
Potassium: 4.4 mEq/L (ref 3.7–5.3)
SODIUM: 138 meq/L (ref 137–147)

## 2014-02-04 MED ORDER — METHYLPREDNISOLONE (PAK) 4 MG PO TABS: ORAL_TABLET | ORAL | Status: DC

## 2014-02-04 NOTE — Discharge Summary (Signed)
Physician Discharge Summary  Denise Ellis JXB:[id number] DOB: 05/20/1980 DOA: 01/30/2014  PCP: Marin Comment, FNP  Admit date: 01/30/2014 Discharge date: 02/04/2014  **PATIENT HAS PAIN CONTRACT.  DO NOT PROVIDE NARCOTIC PRESCRIPTIONS WITHOUT SPEAKING TO HER PAIN SPECIALIST [name], [phone], FIRST. **  Recommendations for Outpatient Follow-up:  1. F/u with Dr. [name], Orthopedics pain clinic, [place], [place] on 10/20 at 350PM at the [facility] office 2. F/u with primary care doctor in 1-2 weeks:  Consider PFTs to confirm "COPD" diagnosis.  F/u anemia please.   3. F/u with Endocrinology in 1 month for further testing for adrenal insufficiency 4. F/u with Gastroenterology for possible endoscopy to confirm diagnosis of Crohn's disease     Discharge Condition: stable, improved  Diet recommendation: healthy heart  Wt Readings from Last 3 Encounters:  12/30/13 95.255 kg (210 lb)  02/14/12 106.595 kg (235 lb)    History of present illness:  33 year old Caucasian female with history of chronic back pain, degenerative disc disease, depression, anxiety, Crohn's disease, who presented with a reported fall with worsening right leg pain and complete numbness of the right leg.  She stated that 2 hours prior to coming to the emergency department, she lost her balance in her kitchen and fell backwards landing on her butt and her back. Immediately she had severe pain in her back and her right leg with numbness of her right leg. X-rays of the thoracic and lumbar spine were negative for fractures. She was unable to ambulate in the emergency department and therefore was admitted. She underwent CT scan of the lumbar and thoracic spine which demonstrated no fracture or subluxation but she had a mild bulging disc at L4-5 and L5-S1 on the right with mild foraminal narrowing. There was suggestion at the L5-S1 nerve root may be somewhat compressed.  Hospital Course:   Fall with right leg  numbness/Sensory Disturbance   Pt reported she has some baseline numbness of the right leg, but that after her fall she developed complete inability to feel anything in that leg.  Initially she had a hard time telling me how high the numbness went, but later stated it was to the level of the gluteal crease.  She also endorsed saddle anesthesia. Objectively, however, she had intact sensation and no hemineglect.  She has chronic RUE and RLE 4/5 strength which is unchanged per her report. CT demonstrated no fracture or subluxation but she had mild bulging disk at L4-5 and L5-S1 on the right with mild foraminal narrowing. At L5-S1 the nerve root may be compressed. Her findings did not explain her reported saddle anesthesia or her urinary retention.  Her case was discussed with Dr. Lovell Sheehan from NSGY who recommend MRI which confirmed right foraminal disc protrusions at L4-5 and L5-S1.  The disc protrusions came close to contacting the L4 and L5 nerve roots which could cause irritation, but there was no compression or spinal stenosis.  She was started on a Medrol Dosepak and was seen by physical and occupational therapy who recommended skilled nursing facility. Initially, she adamantly declined going to rehabilitation, however she later changed her mind. Because she has been under observation, as she would have to pay out of pocket to go to skilled nursing facility. She states she is unable to afford this and would prefer to go home. She will be set up with home health services including a social worker to assist her with escalation to a skilled nursing facility at a later time, if needed.  She was  ordered a 3 and 1,/shower seat, a rolling walker. -on 02/03/14, pt had an unwitnessed mechanical fall in her hospital room--the patient was found sitting on the floor by nursing staff  -Patient claimed that she was trying to put her pants on while sitting on the edge of her bed and she slipped and fell onto her buttocks   -X-rays of the lumbar spine were obtained after a mechanical fall as the patient complained of increased lower back pain, but stated that right lower extremity weakness and decreased sensation have been unchanged  -02/03/14--lumbar xrays neg -Hoover's sign was present on physical exam suggesting there may be a nonphysiologic component to the patient's leg weakness  Chronic pain, followed by pain clinic in [place]. Patient has been on long-term pain management, however, she was unable to accurately report her medications.  Per her PCP's office, she does not walk with a cane or limp and they were unaware of some her narcotic medications that she was being prescribed by her pain clinic.  Her pain clinic reports that the patient has reported her narcotic presciptions missing, called saying her medication was lost or flushed accidentally.  She has presented to multiple ERs requesting additional pain medication due to falls and her pain clinic has had to remind her that that is not allowed under her pain contract.  In fact, she has had two visits to the [facility] ER in the last two months for similar reasons.  Her pain clinic was able to schedule her an appointment with [name], [phone], 02/09/14@350PM  at [facility] office. She is on MS contin 60mg  BID and oxycodone 10mg  four times daily, prescribed by them, and they asked that she NOT receive any additional controlled substance prescriptions at discharge.    Acute urinary retention. During her previous ER visits, she has had some difficulty urinating also. Suspect this may be due to her high dose narcotics, worsened by the escalation in her dose during this admission. She foley catheter placed with 500 mL of urine removed.  She was started on flomax and her foley was removed a day later and she was able to void spontaneously.    Hypotension possibly due to dehydration or adrenal insufficiency, improved somewhat with IVF, however, she has  consistently had low BPs during her ER visits also.  Her cortisol level was <0.5.  Unfortunately, she had are ready been started on steroids before performing a cosyntropin stim test to confirm the diagnosis. Recommend that she complete her steroid taper and then start hydrocortisone 50 mg q. a.m. and 10 mg after lunch. She will need to follow up with an endocrinologist within one month for further testing to confirm the diagnosis. -outpatient endocrinology appointment was setup with Dr. Reather Littler for 02/10/14@1115AM  Depression and anxiety and PTSD probably contributing greatly to her pain and generalized illness/weakness. She is not on SSRI but reports she is on high dose clonazepam.  Patient refuses fluoxetine and states "I have been on all of those medications." States her anxiety is managed by her PCP.  Per her PCP, she refuses to see a psychiatrist or attend counseling and she refuses any medications other than benzodiazepines for treatment of her anxiety.  Clonazepam 2mg  TID was confirmed by PCP's office.  I question her reported diagnosis of COPD. She may have some asthma, and her lungs remained clear.  Continued prn albuterol.  Outpatient PFTs to confirm diagnosis.  Mild Crohn's disease. The patient has been seen in the ER twice in the last  two months prior to this admission and has gotten two CT abd/pelvis. The first showed mild proctitis and the second was normal.  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  Recommend GI follow up for confirmatory endoscopy and miralax with colace and senna to keep bowels regular in the meantime.    Constipation, likely due to narcotics.  Started colace, senna, and schedule miralax   Normocytic anemia, Iron studies c/w mild iron deficiency, b12 273, folate 754, TSH 1.9, occult stool.  Recommend starting iron and vitamin B12 supplementation.     Consultants:  Spoke with Dr. Lovell Sheehan, [facility] by  phone Procedures:  CT thoracic and lumbar spine  MRI lumbar spine Antibiotics:  none    Discharge Exam: Filed Vitals:   02/04/14 0600  BP: 91/56  Pulse: 83  Temp: 97.6 F (36.4 C)  Resp: 20   Filed Vitals:   02/03/14 1250 02/03/14 1400 02/03/14 2240 02/04/14 0600  BP: 145/86 108/54 102/54 91/56  Pulse: 134 77 98 83  Temp: 98.1 F (36.7 C) 98 F (36.7 C) 98.6 F (37 C) 97.6 F (36.4 C)  TempSrc: Oral Oral Oral Oral  Resp: 24 20 20 20   Height:      Weight:      SpO2: 99% 97% 96% 98%    General: No acute distress, alert and oriented x 3  HEENT: NCAT, no icterus  Cardiovascular: RRR, nl S1, S2 no mrg, 2+ pulses, warm extremities  Respiratory: CTAB, no increased WOB  Abdomen: NABS, soft, ND, NT to palpation MSK: Normal tone and bulk, no LEE  Neuro: CN II-XII grossly intact. 4/5 RUE and RLE strength, 5/5 LUE and LLE. Sensation INTACT bilaterally. No hemineglect. No dysmetria.  +Hoover's sign  Discharge Instructions      Discharge Instructions   Call MD for:  difficulty breathing, headache or visual disturbances    Complete by:  As directed      Call MD for:  extreme fatigue    Complete by:  As directed      Call MD for:  hives    Complete by:  As directed      Call MD for:  persistant dizziness or light-headedness    Complete by:  As directed      Call MD for:  persistant nausea and vomiting    Complete by:  As directed      Call MD for:  severe uncontrolled pain    Complete by:  As directed      Call MD for:  temperature >100.4    Complete by:  As directed      Diet general    Complete by:  As directed      Discharge instructions    Complete by:  As directed   You were hospitalized with a fall and back pain with right leg weakness.  Take a medrol dospak, but start on day 2 since you received your first doses in the hospital.  Please follow up with your pain clinic tomorrow at 11AM.  For your anemia, please take iron and vitamin B12 supplements and follow up  with a gastroenterologist.  Take flomax which may help you pee a little easier.  Please read through the information about adrenal insufficiency carefully.  Take hydrocortisone 15mg  first thing in the morning and 10mg  after lunch and follow up with an endocrinologist before you run out of medication.  I strongly encourage you to seek help for your depression  and anxiety, including trying antidepressant medication and counseling.  Please note that your chart has been flagged that you are a member of a pain clinic and that you have a pain contract.  So that we do not accidentally violate your pain contract, the [facility] will not give additional prescriptions for narcotic pain medication without speaking to your pain specialist first.     Driving Restrictions    Complete by:  As directed   No driving, operating heavy machinery while taking narcotic pain medication and clonazepam     Increase activity slowly    Complete by:  As directed             Medication List    STOP taking these medications       diphenoxylate-atropine 2.5-0.025 MG per tablet  Commonly known as:  LOMOTIL     loperamide 2 MG capsule  Commonly known as:  IMODIUM     traMADol 50 MG tablet  Commonly known as:  ULTRAM      TAKE these medications       albuterol (2.5 MG/3ML) 0.083% nebulizer solution  Commonly known as:  PROVENTIL  Take 2.5 mg by nebulization 2 (two) times daily as needed for wheezing or shortness of breath.     bisacodyl 10 MG suppository  Commonly known as:  DULCOLAX  Place 1 suppository (10 mg total) rectally daily as needed for moderate constipation.     clonazePAM 2 MG tablet  Commonly known as:  KLONOPIN  Take 2 mg by mouth 3 (three) times daily.     dicyclomine 20 MG tablet  Commonly known as:  BENTYL  Take 20 mg by mouth 4 (four) times daily as needed for spasms.     ferrous sulfate 325 (65 FE) MG EC tablet  Take 1 tablet (325 mg total) by mouth 3 (three) times daily with  meals.     hydrocortisone 10 MG tablet  Commonly known as:  CORTEF  Take one and a half tabs at 6AM and one tab at 1PM every day.     hydrOXYzine 50 MG tablet  Commonly known as:  ATARAX/VISTARIL  Take 50 mg by mouth 2 (two) times daily as needed for anxiety.     ibuprofen 200 MG tablet  Commonly known as:  ADVIL,MOTRIN  Take 800 mg by mouth at bedtime.     methylPREDNIsolone 4 MG tablet  Commonly known as:  MEDROL DOSPACK  follow package directions.  Please skip DAY 1 and 2 and start with DAY 3.     morphine 60 MG 12 hr tablet  Commonly known as:  MS CONTIN  Take 60 mg by mouth every 12 (twelve) hours.     Oxycodone HCl 10 MG Tabs  Take 10 mg by mouth 4 (four) times daily.     polyethylene glycol powder powder  Commonly known as:  MIRALAX  Take 17 g by mouth daily.     promethazine 12.5 MG tablet  Commonly known as:  PHENERGAN  Take 12.5 mg by mouth every 6 (six) hours as needed for nausea or vomiting.     SPIRIVA RESPIMAT 2.5 MCG/ACT Aers  Generic drug:  Tiotropium Bromide Monohydrate  Inhale 2 puffs into the lungs daily.     tamsulosin 0.4 MG Caps capsule  Commonly known as:  FLOMAX  Take 1 capsule (0.4 mg total) by mouth daily after supper.     vitamin B-12 1000 MCG tablet  Commonly known as:  CYANOCOBALAMIN  Take 1 tablet (1,000 mcg total) by mouth daily.       Follow-up Information   Follow up with Marin Comment, FNP. Schedule an appointment as soon as possible for a visit in 1 week.   Specialty:  Nurse Practitioner   Contact information:   [address] [place] [address] [phone]       Follow up with [name] On 02/09/2014. (350PM)    Contact information:   [facility] [place], [place] office [phone]      Schedule an appointment as soon as possible for a visit with Cristi Loron, MD. (As needed.  Neurosurgery)    Specialty:  Neurosurgery   Contact information:   [address] [place] [address] [phone]       Schedule an appointment as soon as possible for a visit with [facility]. (As needed if you continue to have problems voiding/peeing)    Contact information:   [address] [place] [address] [phone]      Follow up with [facility], MD. Schedule an appointment as soon as possible for a visit in 1 month. (Endocrinology)    Specialty:  Endocrinology   Contact information:   [address] [place] [address] [phone]       Follow up with Melvia Heaps, MD. Schedule an appointment as soon as possible for a visit in 1 month. (Gastroenterology)    Specialty:  Gastroenterology   Contact information:   [address] [place] [address] [phone]       Follow up with [facility]. (As needed.  Walk-ins welcome)    Contact information:   [address] [place] [address]       Follow up with [facility]. (RN, PT, OT, aide, Clinical Social Work)    Solicitor information:   [address] [place] [address] [phone]       Follow up with Reather Littler, MD. (02/10/14@11 :15AM)    Specialty:  Endocrinology   Contact information:   [address] [place] [address] [phone]       The results of significant diagnostics from this hospitalization (including imaging, microbiology, ancillary and laboratory) are listed below for reference.    Significant Diagnostic Studies: Dg Thoracic Spine 2 View  01/30/2014   CLINICAL DATA:  Fall, back pain  EXAM: THORACIC SPINE - 2 VIEW  COMPARISON:  12/30/2013 and CT chest 12/15/2012  FINDINGS: Three views of thoracic spine submitted. No acute fracture or subluxation. Alignment, disc spaces and vertebral heights are preserved.  IMPRESSION: Negative.   Electronically Signed   By: Natasha Mead M.D.   On: 01/30/2014 19:35   Dg Lumbar Spine Complete  01/31/2014   CLINICAL DATA:   Diffuse lower back pain after falling backwards while walking. Patient's legs gave out. Did not hit head. Initial encounter.  EXAM: LUMBAR SPINE - COMPLETE 4+ VIEW  COMPARISON:  Lumbar spine radiographs performed 10/01/2013  FINDINGS: There is no evidence of fracture or subluxation. Vertebral bodies demonstrate normal height and alignment. Intervertebral disc spaces are preserved. The visualized neural foramina are grossly unremarkable in appearance.  The visualized bowel gas pattern is unremarkable in appearance; air and stool are noted within the colon. The sacroiliac joints are within normal limits.  IMPRESSION: No evidence of fracture or subluxation along the lumbar spine.   Electronically Signed   By: Roanna Raider  M.D.   On: 01/31/2014 00:13   Ct Thoracic Spine Wo Contrast  01/31/2014   CLINICAL DATA:  Lost balance and fell backward while walking in kitchen. Landed on back and buttocks, with severe diffuse back pain and right leg numbness. Cannot ambulate. Initial encounter.  EXAM: CT THORACIC AND LUMBAR SPINE WITHOUT CONTRAST  TECHNIQUE: Multidetector CT imaging of the thoracic and lumbar spine was performed without contrast. Multiplanar CT image reconstructions were also generated.  COMPARISON:  Lumbar and thoracic spine radiographs performed 01/30/2014  FINDINGS: CT THORACIC SPINE FINDINGS  There is no evidence of fracture or subluxation along the thoracic spine. Vertebral bodies demonstrate normal height and alignment. Intervertebral disc spaces are preserved. The bony foramina are unremarkable in appearance bilaterally. There is no evidence of foraminal narrowing.  The thyroid gland is unremarkable. The visualized portions of the mediastinum are within normal limits. Minimal bilateral atelectasis is noted.  The paraspinal musculature is unremarkable in appearance.  CT LUMBAR SPINE FINDINGS  There is no evidence of fracture or subluxation along the lumbar spine. Vertebral bodies demonstrate normal  height and alignment. Intervertebral disc spaces are preserved. The bony foramina are unremarkable in appearance bilaterally.  There may be mild bulging of the disc at L4-L5 and L5-S1 on the right side, with mild associated foraminal narrowing. At L5-S1, this may impress on the exiting nerve root; would correlate with the level of the patient's symptoms. Minimal facet disease is noted at the lower lumbar spine.  Visualized bowel structures are unremarkable in appearance. Postoperative change is noted about the cecum. The visualized portions of the kidneys are unremarkable. No vascular calcification is seen.  The paraspinal musculature is unremarkable in appearance.  IMPRESSION: 1. No evidence of fracture or subluxation along the thoracic or lumbar spine. 2. There may be mild bulging of the disc at L4-L5 and L5-S1 on the right side, with mild associated foraminal narrowing. At L5-S1, this may impress on the exiting nerve root. Would correlate with the level of the patient's symptoms.   Electronically Signed   By: Roanna RaiderJeffery  Chang M.D.   On: 01/31/2014 01:46   Ct Lumbar Spine Wo Contrast  01/31/2014   CLINICAL DATA:  Lost balance and fell backward while walking in kitchen. Landed on back and buttocks, with severe diffuse back pain and right leg numbness. Cannot ambulate. Initial encounter.  EXAM: CT THORACIC AND LUMBAR SPINE WITHOUT CONTRAST  TECHNIQUE: Multidetector CT imaging of the thoracic and lumbar spine was performed without contrast. Multiplanar CT image reconstructions were also generated.  COMPARISON:  Lumbar and thoracic spine radiographs performed 01/30/2014  FINDINGS: CT THORACIC SPINE FINDINGS  There is no evidence of fracture or subluxation along the thoracic spine. Vertebral bodies demonstrate normal height and alignment. Intervertebral disc spaces are preserved. The bony foramina are unremarkable in appearance bilaterally. There is no evidence of foraminal narrowing.  The thyroid gland is  unremarkable. The visualized portions of the mediastinum are within normal limits. Minimal bilateral atelectasis is noted.  The paraspinal musculature is unremarkable in appearance.  CT LUMBAR SPINE FINDINGS  There is no evidence of fracture or subluxation along the lumbar spine. Vertebral bodies demonstrate normal height and alignment. Intervertebral disc spaces are preserved. The bony foramina are unremarkable in appearance bilaterally.  There may be mild bulging of the disc at L4-L5 and L5-S1 on the right side, with mild associated foraminal narrowing. At L5-S1, this may impress on the exiting nerve root; would correlate with the level of the patient's symptoms. Minimal facet  disease is noted at the lower lumbar spine.  Visualized bowel structures are unremarkable in appearance. Postoperative change is noted about the cecum. The visualized portions of the kidneys are unremarkable. No vascular calcification is seen.  The paraspinal musculature is unremarkable in appearance.  IMPRESSION: 1. No evidence of fracture or subluxation along the thoracic or lumbar spine. 2. There may be mild bulging of the disc at L4-L5 and L5-S1 on the right side, with mild associated foraminal narrowing. At L5-S1, this may impress on the exiting nerve root. Would correlate with the level of the patient's symptoms.   Electronically Signed   By: Roanna Raider M.D.   On: 01/31/2014 01:46   Mr Lumbar Spine Wo Contrast  01/31/2014   CLINICAL DATA:  Low back and right leg pain and numbness. Patient fell yesterday in her kitchen.  EXAM: MRI LUMBAR SPINE WITHOUT CONTRAST  TECHNIQUE: Multiplanar, multisequence MR imaging of the lumbar spine was performed. No intravenous contrast was administered.  COMPARISON:  CT scan 01/31/2014  FINDINGS: Normal alignment of the lumbar vertebral bodies. They demonstrate normal marrow signal. No acute fracture or bone lesion. The last full intervertebral disc space is labeled L5-S1 and the conus medullaris  terminates at L2. There is small disc space at S1-2. The facets are normally aligned. No pars defects. No significant paraspinal or retroperitoneal findings are demonstrated.  L1-2:  No significant findings.  L2-3:  No significant findings.  L3-4:  No significant findings.  L4-5: Right foraminal/extra foraminal disc protrusion comes close to contacting and could irritate the right L4 nerve root. No spinal or lateral recess stenosis.  L5-S1: Shallow right foraminal disc protrusion comes close to contacting the right L5 nerve root and certainly could irritated. No spinal stenosis. Mild facet disease.  IMPRESSION: Right foraminal disc protrusions at L4-5 and L5-S1.  No acute bony findings.  The conus medullaris terminates at the bottom of L2.   Electronically Signed   By: Loralie Champagne M.D.   On: 01/31/2014 16:18    Microbiology: No results found for this or any previous visit (from the past 240 hour(s)).   Labs: Basic Metabolic Panel:  Recent Labs Lab 01/31/14 0555 02/01/14 0423 02/02/14 0550 02/03/14 0513 02/04/14 0430  NA 142 141 136* 138 138  K 3.7 4.4 4.8 4.9 4.4  CL 109 105 99 100 100  CO2 24 25 27 28 27   GLUCOSE 120* 102* 129* 113* 115*  BUN 17 9 11 12 14   CREATININE 0.67 0.57 0.52 0.69 0.66  CALCIUM 8.1* 9.0 9.8 9.2 9.0   Liver Function Tests: No results found for this basename: AST, ALT, ALKPHOS, BILITOT, PROT, ALBUMIN,  in the last 168 hours No results found for this basename: LIPASE, AMYLASE,  in the last 168 hours No results found for this basename: AMMONIA,  in the last 168 hours CBC:  Recent Labs Lab 01/30/14 2211  01/31/14 0555 02/01/14 0423 02/02/14 0550 02/03/14 0513 02/04/14 0430  WBC 9.7  --  9.4 7.9 10.0 11.7* 13.6*  NEUTROABS 5.1  --   --   --   --   --   --   HGB 11.4*  < > 10.5* 10.7* 11.8* 11.4* 10.8*  HCT 35.8*  < > 32.7* 34.4* 35.7* 36.8 34.2*  MCV 87.1  --  87.2 87.5 86.0 88.5 88.8  PLT 310  --  308 309 366 332 322  < > = values in this interval  not displayed. Cardiac Enzymes: No results found for this  basename: CKTOTAL, CKMB, CKMBINDEX, TROPONINI,  in the last 168 hours BNP: BNP (last 3 results) No results found for this basename: PROBNP,  in the last 8760 hours CBG: No results found for this basename: GLUCAP,  in the last 168 hours  Time coordinating discharge: 45 minutes  Signed:  Sylvain Hasten  [facility] 02/04/2014, 10:03 AM

## 2014-02-04 NOTE — Discharge Summary (Signed)
Reviewed discharge instructions with patient (spouse came in room midway through) including medications, follow up appointments and precautions.  Pt had questions about medications which were addressed.  Pt given pain med before d/c.  Discussed Addison's disease dx and gave pt educational material about this and depression.  Pt verbalized understanding of topics discussed.  Pt being d/c via w/c into care of husband.

## 2014-02-04 NOTE — Progress Notes (Signed)
PT Cancellation Note  Patient Details Name: Denise Ellis MRN: [id number] DOB: 12/01/1980   Cancelled Treatment:    Reason Eval/Treat Not Completed: Patient declined, no reason specified (pt reports too sore from fall yesterday to ambulate, received pain meds earlier this morning.)   Patsey Pitstick,KATHrine E 02/04/2014, 8:56 AM Zenovia Jarred, PT, DPT 02/04/2014 Pager: [phone]

## 2014-02-04 NOTE — Plan of Care (Signed)
Problem: Phase II Progression Outcomes Goal: Progress activity as tolerated unless otherwise ordered Outcome: Adequate for Discharge Pt getting w/c for home use.  Does not want to try walking in hall.  Able to walk in room with CGA.  Problem: Phase III Progression Outcomes Goal: Pain controlled on oral analgesia Outcome: Adequate for Discharge Chronic LBP. Pt on way to pain clinic upon d/c. Goal: Activity at appropriate level-compared to baseline (UP IN CHAIR FOR HEMODIALYSIS)  Outcome: Adequate for Discharge See Phase II.  Problem: Discharge Progression Outcomes Goal: Pain controlled with appropriate interventions Outcome: Adequate for Discharge See Phase III.

## 2014-02-10 ENCOUNTER — Ambulatory Visit: Payer: Medicare Other | Admitting: Endocrinology

## 2014-02-19 NOTE — ED Provider Notes (Signed)
[facility] because of leg weakness and saddle anesthesia. [facility]P and [facility] as discussed case with Dr. Wynetta Emeryram. Of note, patient is a chronic pain patient who has been fired from her pain management clinic.  Dione Boozeavid Jacobie Stamey, MD 02/19/14 [phone]

## 2014-03-11 ENCOUNTER — Encounter (HOSPITAL_COMMUNITY): Payer: Self-pay | Admitting: Emergency Medicine

## 2014-03-11 ENCOUNTER — Emergency Department (HOSPITAL_COMMUNITY): Payer: Medicare Other

## 2014-03-11 ENCOUNTER — Emergency Department (HOSPITAL_COMMUNITY)
Admission: EM | Admit: 2014-03-11 | Discharge: 2014-03-11 | Disposition: A | Discharge status: Home / Self Care | Payer: Medicare Other | Attending: Emergency Medicine | Admitting: Emergency Medicine

## 2014-03-11 DIAGNOSIS — Y998 Other external cause status: Secondary | ICD-10-CM | POA: Diagnosis not present

## 2014-03-11 DIAGNOSIS — R52 Pain, unspecified: Secondary | ICD-10-CM

## 2014-03-11 DIAGNOSIS — Y9389 Activity, other specified: Secondary | ICD-10-CM | POA: Diagnosis not present

## 2014-03-11 DIAGNOSIS — R51 Headache: Secondary | ICD-10-CM | POA: Diagnosis not present

## 2014-03-11 DIAGNOSIS — Y92000 Kitchen of unspecified non-institutional (private) residence as  the place of occurrence of the external cause: Secondary | ICD-10-CM | POA: Diagnosis not present

## 2014-03-11 DIAGNOSIS — G8929 Other chronic pain: Secondary | ICD-10-CM | POA: Insufficient documentation

## 2014-03-11 DIAGNOSIS — G479 Sleep disorder, unspecified: Secondary | ICD-10-CM | POA: Diagnosis not present

## 2014-03-11 DIAGNOSIS — W1839XA Other fall on same level, initial encounter: Secondary | ICD-10-CM | POA: Diagnosis not present

## 2014-03-11 DIAGNOSIS — Z87891 Personal history of nicotine dependence: Secondary | ICD-10-CM | POA: Insufficient documentation

## 2014-03-11 DIAGNOSIS — J441 Chronic obstructive pulmonary disease with (acute) exacerbation: Secondary | ICD-10-CM | POA: Insufficient documentation

## 2014-03-11 DIAGNOSIS — S8002XA Contusion of left knee, initial encounter: Secondary | ICD-10-CM | POA: Insufficient documentation

## 2014-03-11 DIAGNOSIS — R112 Nausea with vomiting, unspecified: Secondary | ICD-10-CM | POA: Insufficient documentation

## 2014-03-11 DIAGNOSIS — F329 Major depressive disorder, single episode, unspecified: Secondary | ICD-10-CM | POA: Diagnosis not present

## 2014-03-11 DIAGNOSIS — R1084 Generalized abdominal pain: Secondary | ICD-10-CM | POA: Diagnosis not present

## 2014-03-11 DIAGNOSIS — R531 Weakness: Secondary | ICD-10-CM | POA: Diagnosis not present

## 2014-03-11 DIAGNOSIS — M545 Low back pain: Secondary | ICD-10-CM | POA: Insufficient documentation

## 2014-03-11 DIAGNOSIS — R519 Headache, unspecified: Secondary | ICD-10-CM

## 2014-03-11 DIAGNOSIS — F419 Anxiety disorder, unspecified: Secondary | ICD-10-CM | POA: Insufficient documentation

## 2014-03-11 DIAGNOSIS — S8001XA Contusion of right knee, initial encounter: Secondary | ICD-10-CM | POA: Diagnosis present

## 2014-03-11 LAB — COMPREHENSIVE METABOLIC PANEL
ALK PHOS: 88 U/L (ref 39–117)
ALT: 16 U/L (ref 0–35)
ANION GAP: 10 (ref 5–15)
AST: 14 U/L (ref 0–37)
Albumin: 3.2 g/dL — ABNORMAL LOW (ref 3.5–5.2)
BUN: 14 mg/dL (ref 6–23)
CHLORIDE: 106 meq/L (ref 96–112)
CO2: 28 mEq/L (ref 19–32)
Calcium: 9.1 mg/dL (ref 8.4–10.5)
Creatinine, Ser: 0.6 mg/dL (ref 0.50–1.10)
GFR calc non Af Amer: >90 mL/min (ref >90)
GLUCOSE: 78 mg/dL (ref 70–99)
POTASSIUM: 3.8 meq/L (ref 3.7–5.3)
Sodium: 144 mEq/L (ref 137–147)
Total Bilirubin: <0.2 mg/dL — ABNORMAL LOW (ref 0.3–1.2)
Total Protein: 7.1 g/dL (ref 6.0–8.3)

## 2014-03-11 LAB — CBC WITH DIFFERENTIAL/PLATELET
Basophils Absolute: 0 10*3/uL (ref 0.0–0.1)
Basophils Relative: 0 % (ref 0–1)
Eosinophils Absolute: 0.2 10*3/uL (ref 0.0–0.7)
Eosinophils Relative: 3 % (ref 0–5)
HCT: 36.3 % (ref 36.0–46.0)
HEMOGLOBIN: 11.6 g/dL — AB (ref 12.0–15.0)
LYMPHS ABS: 3.6 10*3/uL (ref 0.7–4.0)
LYMPHS PCT: 41 % (ref 12–46)
MCH: 28.1 pg (ref 26.0–34.0)
MCHC: 32 g/dL (ref 30.0–36.0)
MCV: 87.9 fL (ref 78.0–100.0)
MONOS PCT: 7 % (ref 3–12)
Monocytes Absolute: 0.6 10*3/uL (ref 0.1–1.0)
Neutro Abs: 4.4 10*3/uL (ref 1.7–7.7)
Neutrophils Relative %: 49 % (ref 43–77)
Platelets: 348 10*3/uL (ref 150–400)
RBC: 4.13 MIL/uL (ref 3.87–5.11)
RDW: 14.6 % (ref 11.5–15.5)
WBC: 8.8 10*3/uL (ref 4.0–10.5)

## 2014-03-11 MED ORDER — ONDANSETRON HCL 4 MG/2ML IJ SOLN
4.0000 mg | Freq: Once | INTRAMUSCULAR | Status: AC
  Administered 2014-03-11: 4 mg via INTRAVENOUS
  Filled 2014-03-11: qty 2

## 2014-03-11 MED ORDER — MAGNESIUM SULFATE 2 GM/50ML IV SOLN
2.0000 g | Freq: Once | INTRAVENOUS | Status: AC
  Administered 2014-03-11: 2 g via INTRAVENOUS
  Filled 2014-03-11: qty 50

## 2014-03-11 MED ORDER — SODIUM CHLORIDE 0.9 % IV BOLUS (SEPSIS)
1000.0000 mL | Freq: Once | INTRAVENOUS | Status: AC
  Administered 2014-03-11: 1000 mL via INTRAVENOUS

## 2014-03-11 MED ORDER — DIPHENHYDRAMINE HCL 50 MG/ML IJ SOLN
25.0000 mg | Freq: Once | INTRAMUSCULAR | Status: AC
  Administered 2014-03-11: 25 mg via INTRAVENOUS
  Filled 2014-03-11: qty 1

## 2014-03-11 MED ORDER — METOCLOPRAMIDE HCL 5 MG/ML IJ SOLN
10.0000 mg | Freq: Once | INTRAMUSCULAR | Status: AC
  Administered 2014-03-11: 10 mg via INTRAVENOUS
  Filled 2014-03-11: qty 2

## 2014-03-11 MED ORDER — HYDROMORPHONE HCL 1 MG/ML IJ SOLN
1.0000 mg | Freq: Once | INTRAMUSCULAR | Status: AC
  Administered 2014-03-11: 1 mg via INTRAVENOUS
  Filled 2014-03-11: qty 1

## 2014-03-11 NOTE — Discharge Instructions (Signed)
°Emergency Department Resource Guide °1) Find a Doctor and Pay Out of Pocket °Although you won't have to find out who is covered by your insurance plan, it is a good idea to ask around and get recommendations. You will then need to call the office and see if the doctor you have chosen will accept you as a new patient and what types of options they offer for patients who are self-pay. Some doctors offer discounts or will set up payment plans for their patients who do not have insurance, but you will need to ask so you aren't surprised when you get to your appointment. ° °2) Contact Your Local Health Department °Not all health departments have doctors that can see patients for sick visits, but many do, so it is worth a call to see if yours does. If you don't know where your local health department is, you can check in your phone book. The CDC also has a tool to help you locate your state's health department, and many state websites also have listings of all of their local health departments. ° °3) Find a Walk-in Clinic °If your illness is not likely to be very severe or complicated, you may want to try a walk in clinic. These are popping up all over the country in pharmacies, drugstores, and shopping centers. They're usually staffed by nurse practitioners or physician assistants that have been trained to treat common illnesses and complaints. They're usually fairly quick and inexpensive. However, if you have serious medical issues or chronic medical problems, these are probably not your best option. ° °No Primary Care Doctor: °- Call [facility] at  832-8000 - they can help you locate a primary care doctor that  accepts your insurance, provides certain services, etc. °- Physician Referral Service- 1-800-533-3463 ° °Chronic Pain Problems: °Organization         Address  Phone   Notes  °[facility]  (336) 297-2271 Patients need to be referred by their primary care doctor.  ° °Medication  Assistance: °Organization         Address  Phone   Notes  °[place] [facility] [address] °[place], [place] [address] (336) 641-8030 --Must be a resident of [place] °-- Must have NO insurance coverage whatsoever (no Medicaid/ Medicare, etc.) °-- The pt. MUST have a primary care doctor that directs their care regularly and follows them in the community °  °[facility]  (866) 331-1348   °[employer]  (888) 892-1162   ° °Agencies that provide inexpensive medical care: °Organization         Address  Phone   Notes  °[facility]  (336) 832-8035   °[facility]    (336) 832-7272   °[facility] [address] °[place], [place] [address] (336) 832-4777   °[facility] [address], °[place] (336) 271-4999   °[facility]    (336) 373-0678   °[facility]    (336) 272-1050   °[facility] ° [address], [place] Phone:  (336) 832-4444, Fax:  (336) 832-4440 Hours of Operation:  9 am - 6 pm, M-F.  Also accepts Medicaid/Medicare and self-pay.  °[facility] ° [address], [place] Phone: (336) 832-3150, Fax: (336) 832-3151. Hours of Operation:  8:30 am - 5:30 pm, M-F.  Also accepts Medicaid and self-pay.  °[facility] [place] [address], [place] Phone: (336) 878-6027   °[facility] [address], [place], [place] (336)723-1848, Ext. 123 Mondays & Thursdays: 7-9 AM.  First 15 patients are seen on a first come, first serve basis. °  ° °Medicaid-accepting [place] Providers: ° °Organization         Address  Phone   Notes  °[facility] [address], [place] (336) 641-2100 Also accepts self-pay patients.  °[facility] [address], [place] ° (336) 856-9996   °[facility] [address], [place]  (336) 288-8857   °[facility] [address] [place] [address], [place] (336) 299-7000   °Veita Bland [address], [place]  ° (336) 373-1557 Only accepts [place] Access Medicaid patients after they have their name applied to their card.  ° °Self-Pay (no insurance) in [place]: ° °Organization         Address  Phone   Notes  °Sickle Cell Patients, [facility] [address], [place] (336) 832-1970   °[facility] [address], [place] (336) 832-4400   °[facility] ° [address], [place] (336) 992-4800   °[facility]/Dr. Osei-Bonsu ° [address] [place] [address], [place] or [address], [place] (336) 841-8500 Phone number for both [place] and [place] locations is the same.  °[facility] [address], [place] (336) 299-0000   °[facility] [address] [place] [address], [place] or [address] (336) 852-7530 °(336) 878-2260   °[facility] [address], [place] (336) 350-1642, phone; (336) 294-5005, fax Sees patients 1st and 3rd Saturday of every month.  Must not qualify for public or private insurance (i.e. Medicaid, Medicare, [place] Health Choice, Veterans' Benefits) • Household income should be no more than 200% of the poverty level •The clinic cannot treat you if you are pregnant or think you are pregnant • Sexually transmitted diseases are not treated at the clinic.  ° ° °Dental Care: °Organization         Address  Phone  Notes  °[place] [facility] [address], [place] (336) 641-6152 Accepts children up to age 21 who are enrolled in Medicaid or [place] Health Choice; pregnant women with a Medicaid card; and children who have applied for Medicaid or [place] Health Choice, but were declined, whose parents can pay a reduced fee at time of service.  °[place]  [facility] [place]  [address], [place] (336) 641-7733 Accepts children up to age 21 who are enrolled in Medicaid or [place] Health Choice; pregnant women with a Medicaid card; and children who have applied for Medicaid or [place] Health Choice, but were declined, whose parents can pay a reduced fee at time of service.  °[facility] ° [address], [place] (336) 641-4533 Patients are seen by appointment only. Walk-ins are not accepted. [facility] will see patients 18 years of age and older. °Monday - Tuesday (8am-5pm) °Most Wednesdays (8:30-5pm) °$30 per visit, cash only  °[facility] ° [address], [place] (336) 641-4533 Patients are seen by appointment only. Walk-ins are not accepted. [facility] will see patients 18 years of age and older. °One   Wednesday Evening (Monthly: Volunteer Based).  $30 per visit, cash only  °[facility]  (919) 537-3737 for adults; Children under age 4, call Graduate Pediatric Dentistry at (919) [phone]. Children aged 4-14, please call (919) [phone] to request a pediatric application. ° Dental services are provided in all areas of dental care including fillings, crowns and bridges, complete and partial dentures, implants, gum treatment, root canals, and extractions. Preventive care is also provided. Treatment is provided to both adults and children. °Patients are selected via a lottery and there is often a waiting list. °  °[facility] [address], °[place] ° (336) 763-8833 [email or web address] °  °[facility] [address], [place], [place] (336)723-1848, Ext. 123 Second and Fourth Thursday of each month, opens at 6:30 AM; Clinic ends at 9 AM.  Patients are seen on a first-come first-served basis, and a limited number are seen during each clinic.  ° °[facility] ° [address], [place], [place] (336) 723-7904    Eligibility Requirements °You must have lived in [place], [place], or [place] counties for at least the last three months. °  You cannot be eligible for state or federal sponsored healthcare insurance, including Veterans Administration, Medicaid, or Medicare. °  You generally cannot be eligible for healthcare insurance through your employer.  °  How to apply: °Eligibility screenings are held every Tuesday and Wednesday afternoon from 1:00 pm until 4:00 pm. You do not need an appointment for the interview!  °[facility] [address], [place], [place] 336-631-2330   °[facility] [facility]  336-342-8273   °[facility]  336-703-3100   °[facility]  336-570-6415   ° °Behavioral Health Resources in the Community: °Intensive Outpatient Programs °Organization         Address  Phone  Notes  °[facility] [address], [place], [place] 336-878-6098   °[facility] [address], [place], [place] 336-832-9800   °[facility] [address], [place], [place] ° 336-882-2125   °[facility] [address],  °[place], [place] 1-800-853-5163 or 336-641-4981   °Substance Abuse Resources °Organization         Address  Phone  Notes  °[facility]  336-882-2125   °[facility]  336-784-9470   °[facility]  336-285-9073   °[facility]  336-845-3988   °Residential & Outpatient Substance Abuse Program  1-800-659-3381   °Psychological Services °Organization         Address  Phone  Notes  °[facility]  336- 832-9600   °[facility]  336- 378-7881   °[facility] [address], [place] 1-800-853-5163 or 336-641-4981   ° °Mobile Crisis Teams °Organization         Address  Phone  Notes  °[facility], Mobile Crisis Care Unit  1-877-626-1772   °Assertive °[facility] ° [address]  [place], [place] 336-834-9664   °Sharon DeEsch [address] °[place] 336-554-5454   ° °Self-Help/Support Groups °Organization         Address  Phone             Notes  °[facility] - variety of support groups  336- 373-1402 Call for more information  °Narcotics Anonymous (NA), [facility] [address], °[place]   2 meetings at this location  ° °  Residential Treatment Programs °Organization         Address  Phone  Notes  °[facility] [address],    °[place]  1-866-801-8205   °[facility] ° [address], [place], [place] 704-293-8524   °[facility] [address], [place] 336-845-3988 Admissions: 8am-3pm M-F  °[facility] [address]    °[place], [place] 336-841-1104   °[facility] [address], [place], [place] 336-379-7146   °[facility] [address]  °[place], [place] 336-285-9073   °[facility] - Intensive Outpatient [address], [place], [place] 336-852-3033   °[facility] [address]  °[place], [place] 1-877-615-2722 or 336-784-9470   °[facility] [address] , [place] 336-227-7417 Accepts Medicaid  °[facility] [address]  °[place] 1-800-659-3381 Substance Abuse/Addiction Treatment  ° °[place] Behavioral Health Resources °Organization         Address  Phone  Notes  °[facility]  (888) 581-9988   °Julie Brannon, PhD [address] [place], [place]   (336) 349-5553 or (336) 951-0000   °[facility]   [address] °[place], [place] (336) 349-4454   °[facility] [address], [place], [place] (336) 342-8316 Insurance/Medicaid/sponsorship through [facility]  °[facility] [address]                                    [place], [place] (336) 342-8316 Therapy/tele-psych/case    °[facility] [address]  ° [place], [place] (336) 349-2233    °Dr. Arfeen  (336) 349-4544   °[facility] [place]  [employer] [place] [facility] 1) [address], [place] °2) [address], [place] °3)  [address], [place] (336) 349-3220 °(336) 342-7768 ° °(336) 342-8140   °[place] [facility] (336) 342-1394 or (336) 342-3537 (After Hours)    ° ° °

## 2014-03-11 NOTE — ED Provider Notes (Signed)
CSN: [id number]     Arrival date & time 03/11/14  1275 History   First MD Initiated Contact with Patient 03/11/14 [phone]     Chief Complaint  Patient presents with  . Back Pain     (Consider location/radiation/quality/duration/timing/severity/associated sxs/prior Treatment) HPI Comments: Patient with a history of chronic low back pain, Crohn's disease, COPD requiring 2 L of oxygen at home presents today complaining of a headache that started approximately 11 PM last night and has gradually worsened until today. She does not get headaches frequently and states this is the worst headache she has ever had. Also prior to the headache starting she noticed some dizziness which she describes as feeling like the floor is moving when she attempts to walk and needing to hold onto things so she can make it from one room of her home to another which has continued since the headache has started. Also since the headache started she's noticed blurred vision with some mild double vision and slight weakness in her right upper extremity which is new.  Light and loud noise seems to make the headache worse she has not tried anything to make it better.  Secondly patient is complaining of worsening lower back pain. She has had low back pain for approximately 13 years but states in the last month it has seemed to get worse now causing her legs to give out and she falls intermittently without warning. The last fall was yesterday while she was standing in her kitchen and she fell on her knees. She has bruising on her knees but she has still been able to walk. She is taking Percocet at home for the pain intermittently and waiting to get into pain management.  The history is provided by the patient.    Past Medical History  Diagnosis Date  . Crohn's disease   . Chronic back pain     Do NOT give narcotic RX!  CALLArlee Muslim, [phone], her pain specialist before giving any additional prescriptions  . COPD  (chronic obstructive pulmonary disease)     possible.    . DDD (degenerative disc disease)   . Depression     refused psychiatry, counseling, or any medications other than benzos  . Anxiety   . Chronic leg pain     right leg "I don't have much feeling in it"   Past Surgical History  Procedure Laterality Date  . Appendectomy     Family History  Problem Relation Age of Onset  . Ovarian cancer Mother   . Heart disease Father   . Diabetes Mellitus I Father    History  Substance Use Topics  . Smoking status: Former Smoker -- 0.20 packs/day for 18 years    Types: Cigarettes    Quit date: 01/31/2009  . Smokeless tobacco: Never Used  . Alcohol Use: No   OB History    No data available     Review of Systems  Constitutional: Negative for fever.  Respiratory: Positive for shortness of breath. Negative for cough and wheezing.        Chronic shortness of breath  Gastrointestinal: Positive for nausea, vomiting and abdominal pain.       Chronic abdominal pain from her Crohn's disease which she feels is worse because of increased stress in her life due to her other medical problems  Genitourinary: Negative for dysuria.  Neurological: Positive for dizziness and weakness.  Psychiatric/Behavioral: Positive for sleep disturbance and dysphoric mood. The patient is nervous/anxious.  All other systems reviewed and are negative.     Allergies  Gabapentin and Gadolinium derivatives  Home Medications   Prior to Admission medications   Medication Sig Start Date End Date Taking? Authorizing Provider  albuterol (PROVENTIL) (2.5 MG/3ML) 0.083% nebulizer solution Take 2.5 mg by nebulization 2 (two) times daily as needed for wheezing or shortness of breath.    Historical Provider, MD  bisacodyl (DULCOLAX) 10 MG suppository Place 1 suppository (10 mg total) rectally daily as needed for moderate constipation. 02/02/14   Renae FickleMackenzie Short, MD  clonazePAM (KLONOPIN) 2 MG tablet Take 2 mg by mouth 3  (three) times daily.    Historical Provider, MD  dicyclomine (BENTYL) 20 MG tablet Take 20 mg by mouth 4 (four) times daily as needed for spasms.    Historical Provider, MD  ferrous sulfate 325 (65 FE) MG EC tablet Take 1 tablet (325 mg total) by mouth 3 (three) times daily with meals. 02/02/14   Renae FickleMackenzie Short, MD  hydrocortisone (CORTEF) 10 MG tablet Take one and a half tabs at 6AM and one tab at 1PM every day. 02/02/14   Renae FickleMackenzie Short, MD  hydrOXYzine (ATARAX/VISTARIL) 50 MG tablet Take 50 mg by mouth 2 (two) times daily as needed for anxiety.     Historical Provider, MD  ibuprofen (ADVIL,MOTRIN) 200 MG tablet Take 800 mg by mouth at bedtime.    Historical Provider, MD  methylPREDNIsolone (MEDROL DOSPACK) 4 MG tablet follow package directions.  Please skip DAY 1 and 2 and start with DAY 3. 02/04/14   Catarina Hartshornavid Tat, MD  morphine (MS CONTIN) 60 MG 12 hr tablet Take 60 mg by mouth every 12 (twelve) hours.    Historical Provider, MD  Oxycodone HCl 10 MG TABS Take 10 mg by mouth 4 (four) times daily.    Historical Provider, MD  polyethylene glycol powder (MIRALAX) powder Take 17 g by mouth daily. 02/02/14   Renae FickleMackenzie Short, MD  promethazine (PHENERGAN) 12.5 MG tablet Take 12.5 mg by mouth every 6 (six) hours as needed for nausea or vomiting.    Historical Provider, MD  tamsulosin (FLOMAX) 0.4 MG CAPS capsule Take 1 capsule (0.4 mg total) by mouth daily after supper. 02/02/14   Renae FickleMackenzie Short, MD  Tiotropium Bromide Monohydrate (SPIRIVA RESPIMAT) 2.5 MCG/ACT AERS Inhale 2 puffs into the lungs daily.    Historical Provider, MD  vitamin B-12 (CYANOCOBALAMIN) 1000 MCG tablet Take 1 tablet (1,000 mcg total) by mouth daily. 02/02/14   Renae FickleMackenzie Short, MD   BP 103/64 mmHg  Pulse 79  Temp(Src) 98.5 F (36.9 C) (Oral)  Resp 16  SpO2 100% Physical Exam  Constitutional: She is oriented to person, place, and time. She appears well-developed and well-nourished. No distress.  dissheveled appearance  HENT:   Head: Normocephalic and atraumatic.  Mouth/Throat: Oropharynx is clear and moist. Mucous membranes are dry.  Eyes: Conjunctivae and EOM are normal. Pupils are equal, round, and reactive to light.  Neck: Normal range of motion. Neck supple. No spinous process tenderness and no muscular tenderness present.  Cardiovascular: Normal rate, regular rhythm and intact distal pulses.   No murmur heard. Pulmonary/Chest: Effort normal and breath sounds normal. No respiratory distress. She has no wheezes. She has no rales.  Abdominal: Soft. Bowel sounds are normal. She exhibits no distension. There is tenderness. There is no rebound and no guarding.  Mild diffuse tenderness  Musculoskeletal: She exhibits tenderness. She exhibits no edema.       Lumbar back: She exhibits decreased range  of motion, tenderness and bony tenderness.  Tenderness in bilateral knees with healing ecchymosis  Neurological: She is alert and oriented to person, place, and time. No cranial nerve deficit or sensory deficit.  Normal finger to nose.  No visual field cuts.  4/5 strength in the right upper/lower ext and 5/5 strength in the left upper/lower ext.  No pronator drift.  Pt states unable to walk due to severe back pain  Skin: Skin is warm and dry. No rash noted. No erythema.  Psychiatric: Her behavior is normal. Her affect is blunt. She exhibits a depressed mood. She expresses no homicidal and no suicidal ideation.  Nursing note and vitals reviewed.   ED Course  Procedures (including critical care time) Labs Review Labs Reviewed  CBC WITH DIFFERENTIAL - Abnormal; Notable for the following:    Hemoglobin 11.6 (*)    All other components within normal limits  COMPREHENSIVE METABOLIC PANEL - Abnormal; Notable for the following:    Albumin 3.2 (*)    Total Bilirubin <0.2 (*)    All other components within normal limits  URINALYSIS, ROUTINE W REFLEX MICROSCOPIC    Imaging Review Ct Head Wo Contrast  03/11/2014    CLINICAL DATA:  Retro-orbital headache.  Blurred vision.  EXAM: CT HEAD WITHOUT CONTRAST  TECHNIQUE: Contiguous axial images were obtained from the base of the skull through the vertex without intravenous contrast.  COMPARISON:  09/25/2011  FINDINGS: No evidence of intracranial hemorrhage, brain edema, or other signs of acute infarction. No evidence of intracranial mass lesion or mass effect.  No abnormal extraaxial fluid collections identified. Ventricles are normal in size. No skull abnormality identified.  IMPRESSION: Negative noncontrast head CT.   Electronically Signed   By: Myles Rosenthal M.D.   On: 03/11/2014 08:45   Mr Brain Wo Contrast  03/11/2014   CLINICAL DATA:  33 year old female with syncope at 1700 hr last night. Dizziness and blurred vision persist today. Retro-orbital headache. Initial encounter.  EXAM: MRI HEAD WITHOUT CONTRAST  TECHNIQUE: Multiplanar, multiecho pulse sequences of the brain and surrounding structures were obtained without intravenous contrast.  COMPARISON:  Head CT without contrast 02/1914. [facility] Brain MRI 12/11/2011  FINDINGS: Cerebral volume remains normal. Major intracranial vascular flow voids are stable. No restricted diffusion to suggest acute infarction. No midline shift, mass effect, evidence of mass lesion, ventriculomegaly, extra-axial collection or acute intracranial hemorrhage. Cervicomedullary junction and pituitary are within normal limits. Negative visualized cervical spine. Wallace Cullens and white matter signal throughout the brain remains normal.  Visible internal auditory structures appear normal. Visualized paranasal sinuses and mastoids are clear. Visualized orbit soft tissues are within normal limits. Visualized scalp soft tissues are within normal limits. Normal bone marrow signal.  IMPRESSION: Stable and normal non contrast MRI appearance of the brain.   Electronically Signed   By: Augusto Gamble M.D.   On: 03/11/2014 11:49   Dg Knee Complete 4 Views  Left  03/11/2014   CLINICAL DATA:  patient landed on knees and now having bilateral anterior knee pain  EXAM: LEFT KNEE - COMPLETE 4+ VIEW  COMPARISON:  None.  FINDINGS: No fracture of the proximal tibia or distal femur. Patella is normal. No joint effusion.  IMPRESSION: No fracture or dislocation.   Electronically Signed   By: Genevive Bi M.D.   On: 03/11/2014 09:47   Dg Knee Complete 4 Views Right  03/11/2014   CLINICAL DATA:  patient landed on knees and now having bilateral anterior knee pain  EXAM: RIGHT  KNEE - COMPLETE 4+ VIEW  COMPARISON:  None.  FINDINGS: No fracture of the proximal tibia or distal femur. Patella is normal. No joint effusion.  IMPRESSION: No fracture or dislocation.   Electronically Signed   By: Genevive Bi M.D.   On: 03/11/2014 09:47     EKG Interpretation   Date/Time:  Thursday March 11 2014 08:12:56 EST Ventricular Rate:  78 PR Interval:  164 QRS Duration: 91 QT Interval:  393 QTC Calculation: 448 R Axis:   86 Text Interpretation:  Sinus rhythm Borderline T abnormalities, anterior  leads Baseline wander in lead(s) V4 V5 V6 No significant change since last  tracing Confirmed by Anitra Lauth  MD, Alphonzo Lemmings ([id number]) on 03/11/2014 8:37:07  AM      MDM   Final diagnoses:  Headache  Pain   patient presenting with new complaint of headache that started approximately 9 hours of headache and has gradually worsened. She has noticed some mild right hand weakness, difficulty walking due to dizziness and blurry/double vision. On exam patient has 4 out of 5 strength in the right upper and lower extremity compared to the left. sHe has no visual field cuts or abnormal finger to nose.   She denies fever but has had nausea and vomiting since yesterday when the headache started. The headache does have symptoms concerning for migraine however given her neurologic complaints also concern for stroke versus cavernous venous thrombosis. Patient takes no hormonal supplements  and has no prior history of clots. Low suspicion for [facility] given not sudden onset, syncope.  She also complains of her chronic pack pain being worse over the last 1 month as well as intermittent abdominal pain from her Crohn's disease. She feels that these issues are worse due to increased stress. On exam patient is obviously disheveled and appears depressed. She admits to feeling depressed but is currently not being treated. She denies any suicidal thoughts.  CBC, UA, CMP, EKG, head CT pending. Patient given headache cocktail pain control for her back.  9:43 AM CT is neg however pt's sx are not improved after headache cocktail.  Will give IV mag but get an MRI to further evaluate.   12:07 PM Imaging and labs are wnl.  At this time will d/c pt home.  Pt to f/u with PCP if symptoms continue and given neuro f/u.  Gwyneth Sprout, MD 03/11/14 1239

## 2014-03-11 NOTE — ED Notes (Signed)
MD at bedside. 

## 2014-03-11 NOTE — ED Notes (Signed)
In addition to below note, pt reports syncopal episode at 1700 last night, with blurry vision and dizziness persisting to today.
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
## 2014-03-11 NOTE — ED Notes (Signed)
Bed: WA09 Expected date:  Expected time:  Means of arrival:  Comments: Back pain x 13 yrs

## 2014-03-11 NOTE — ED Notes (Signed)
Pt returned from MRI °

## 2014-03-11 NOTE — ED Notes (Signed)
Patient transported to CT 

## 2014-03-11 NOTE — ED Notes (Signed)
Pt made aware of need for urine specimen 

## 2014-03-11 NOTE — ED Notes (Signed)
Patient transported to MRI 

## 2014-03-11 NOTE — ED Notes (Signed)
MD is aware of not having urine

## 2014-03-11 NOTE — ED Notes (Signed)
Patient tried to use female urinal and was unsuccessful... Will try again later

## 2014-04-03 ENCOUNTER — Emergency Department (HOSPITAL_COMMUNITY): Payer: Medicare Other

## 2014-04-03 ENCOUNTER — Other Ambulatory Visit: Payer: Self-pay

## 2014-04-03 ENCOUNTER — Emergency Department (HOSPITAL_COMMUNITY)
Admission: EM | Admit: 2014-04-03 | Discharge: 2014-04-03 | Disposition: A | Discharge status: Home / Self Care | Payer: Medicare Other | Attending: Emergency Medicine | Admitting: Emergency Medicine

## 2014-04-03 ENCOUNTER — Encounter (HOSPITAL_COMMUNITY): Payer: Self-pay | Admitting: Emergency Medicine

## 2014-04-03 DIAGNOSIS — S60212A Contusion of left wrist, initial encounter: Secondary | ICD-10-CM

## 2014-04-03 DIAGNOSIS — Z87891 Personal history of nicotine dependence: Secondary | ICD-10-CM | POA: Insufficient documentation

## 2014-04-03 DIAGNOSIS — G8929 Other chronic pain: Secondary | ICD-10-CM | POA: Diagnosis not present

## 2014-04-03 DIAGNOSIS — Z8719 Personal history of other diseases of the digestive system: Secondary | ICD-10-CM | POA: Insufficient documentation

## 2014-04-03 DIAGNOSIS — Y9389 Activity, other specified: Secondary | ICD-10-CM | POA: Insufficient documentation

## 2014-04-03 DIAGNOSIS — Z79899 Other long term (current) drug therapy: Secondary | ICD-10-CM | POA: Insufficient documentation

## 2014-04-03 DIAGNOSIS — S161XXA Strain of muscle, fascia and tendon at neck level, initial encounter: Secondary | ICD-10-CM | POA: Diagnosis not present

## 2014-04-03 DIAGNOSIS — T1490XA Injury, unspecified, initial encounter: Secondary | ICD-10-CM

## 2014-04-03 DIAGNOSIS — Y92410 Unspecified street and highway as the place of occurrence of the external cause: Secondary | ICD-10-CM | POA: Diagnosis not present

## 2014-04-03 DIAGNOSIS — J449 Chronic obstructive pulmonary disease, unspecified: Secondary | ICD-10-CM | POA: Insufficient documentation

## 2014-04-03 DIAGNOSIS — Y998 Other external cause status: Secondary | ICD-10-CM | POA: Diagnosis not present

## 2014-04-03 DIAGNOSIS — S60211A Contusion of right wrist, initial encounter: Secondary | ICD-10-CM | POA: Diagnosis not present

## 2014-04-03 DIAGNOSIS — F419 Anxiety disorder, unspecified: Secondary | ICD-10-CM | POA: Diagnosis not present

## 2014-04-03 DIAGNOSIS — S8002XA Contusion of left knee, initial encounter: Secondary | ICD-10-CM | POA: Insufficient documentation

## 2014-04-03 DIAGNOSIS — Z8739 Personal history of other diseases of the musculoskeletal system and connective tissue: Secondary | ICD-10-CM | POA: Insufficient documentation

## 2014-04-03 DIAGNOSIS — S199XXA Unspecified injury of neck, initial encounter: Secondary | ICD-10-CM | POA: Diagnosis present

## 2014-04-03 MED ORDER — NAPROXEN 500 MG PO TABS: 500.0000 mg | ORAL_TABLET | Freq: Two times a day (BID) | ORAL | Status: DC

## 2014-04-03 MED ORDER — HYDROMORPHONE HCL 1 MG/ML IJ SOLN
1.0000 mg | Freq: Once | INTRAMUSCULAR | Status: AC
  Administered 2014-04-03: 1 mg via INTRAMUSCULAR
  Filled 2014-04-03: qty 1

## 2014-04-03 MED ORDER — CYCLOBENZAPRINE HCL 10 MG PO TABS: 10.0000 mg | ORAL_TABLET | Freq: Two times a day (BID) | ORAL | Status: DC | PRN

## 2014-04-03 MED ORDER — OXYCODONE-ACETAMINOPHEN 5-325 MG PO TABS
1.0000 | ORAL_TABLET | ORAL | Status: DC | PRN
Start: 2014-04-03 — End: 2015-10-25

## 2014-04-03 NOTE — ED Notes (Signed)
Pt given ice pack for comfort, she is very concerned due to swelling of her rt wrist, reviewed comfort measures and meds, encouraged her to follow up with PMD if not better by Monday.

## 2014-04-03 NOTE — ED Notes (Signed)
Pt in XRAY 
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
## 2014-04-03 NOTE — ED Notes (Signed)
Upon entering room pt has bruising to wrists and knees bilaterally. Pt complaint of wrist pain, knee pain, back pain, and right side pain unchanged by pain medication administration. See MAR.

## 2014-04-03 NOTE — Discharge Instructions (Signed)
Cervical Sprain °A cervical sprain is an injury in the neck in which the strong, fibrous tissues (ligaments) that connect your neck bones stretch or tear. Cervical sprains can range from mild to severe. Severe cervical sprains can cause the neck vertebrae to be unstable. This can lead to damage of the spinal cord and can result in serious nervous system problems. The amount of time it takes for a cervical sprain to get better depends on the cause and extent of the injury. Most cervical sprains heal in 1 to 3 weeks. °CAUSES  °Severe cervical sprains may be caused by:  °· Contact sport injuries (such as from football, rugby, wrestling, hockey, auto racing, gymnastics, diving, martial arts, or boxing).   °· Motor vehicle collisions.   °· Whiplash injuries. This is an injury from a sudden forward and backward whipping movement of the head and neck.  °· Falls.   °Mild cervical sprains may be caused by:  °· Being in an awkward position, such as while cradling a telephone between your ear and shoulder.   °· Sitting in a chair that does not offer proper support.   °· Working at a poorly designed computer station.   °· Looking up or down for long periods of time.   °SYMPTOMS  °· Pain, soreness, stiffness, or a burning sensation in the front, back, or sides of the neck. This discomfort may develop immediately after the injury or slowly, 24 hours or more after the injury.   °· Pain or tenderness directly in the middle of the back of the neck.   °· Shoulder or upper back pain.   °· Limited ability to move the neck.   °· Headache.   °· Dizziness.   °· Weakness, numbness, or tingling in the hands or arms.   °· Muscle spasms.   °· Difficulty swallowing or chewing.   °· Tenderness and swelling of the neck.   °DIAGNOSIS  °Most of the time your health care provider can diagnose a cervical sprain by taking your history and doing a physical exam. Your health care provider will ask about previous neck injuries and any known neck  problems, such as arthritis in the neck. X-rays may be taken to find out if there are any other problems, such as with the bones of the neck. Other tests, such as a CT scan or MRI, may also be needed.  °TREATMENT  °Treatment depends on the severity of the cervical sprain. Mild sprains can be treated with rest, keeping the neck in place (immobilization), and pain medicines. Severe cervical sprains are immediately immobilized. Further treatment is done to help with pain, muscle spasms, and other symptoms and may include: °· Medicines, such as pain relievers, numbing medicines, or muscle relaxants.   °· Physical therapy. This may involve stretching exercises, strengthening exercises, and posture training. Exercises and improved posture can help stabilize the neck, strengthen muscles, and help stop symptoms from returning.   °HOME CARE INSTRUCTIONS  °· Put ice on the injured area.   °¨ Put ice in a plastic bag.   °¨ Place a towel between your skin and the bag.   °¨ Leave the ice on for 15-20 minutes, 3-4 times a day.   °· If your injury was severe, you may have been given a cervical collar to wear. A cervical collar is a two-piece collar designed to keep your neck from moving while it heals. °¨ Do not remove the collar unless instructed by your health care provider. °¨ If you have long hair, keep it outside of the collar. °¨ Ask your health care provider before making any adjustments to your collar. Minor   adjustments may be required over time to improve comfort and reduce pressure on your chin or on the back of your head.  Ifyou are allowed to remove the collar for cleaning or bathing, follow your health care provider's instructions on how to do so safely.  Keep your collar clean by wiping it with mild soap and water and drying it completely. If the collar you have been given includes removable pads, remove them every 1-2 days and hand wash them with soap and water. Allow them to air dry. They should be completely  dry before you wear them in the collar.  If you are allowed to remove the collar for cleaning and bathing, wash and dry the skin of your neck. Check your skin for irritation or sores. If you see any, tell your health care provider.  Do not drive while wearing the collar.   Only take over-the-counter or prescription medicines for pain, discomfort, or fever as directed by your health care provider.   Keep all follow-up appointments as directed by your health care provider.   Keep all physical therapy appointments as directed by your health care provider.   Make any needed adjustments to your workstation to promote good posture.   Avoid positions and activities that make your symptoms worse.   Warm up and stretch before being active to help prevent problems.  SEEK MEDICAL CARE IF:   Your pain is not controlled with medicine.   You are unable to decrease your pain medicine over time as planned.   Your activity level is not improving as expected.  SEEK IMMEDIATE MEDICAL CARE IF:   You develop any bleeding.  You develop stomach upset.  You have signs of an allergic reaction to your medicine.   Your symptoms get worse.   You develop new, unexplained symptoms.   You have numbness, tingling, weakness, or paralysis in any part of your body.  MAKE SURE YOU:   Understand these instructions.  Will watch your condition.  Will get help right away if you are not doing well or get worse. Document Released: 02/04/2007 Document Revised: 04/14/2013 Document Reviewed: 10/15/2012 [facility] Patient Information 2015 [employer], [place]. This information is not intended to replace advice given to you by your health care provider. Make sure you discuss any questions you have with your health care provider.  Please call your doctor for a followup appointment within 24-48 hours. When you talk to your doctor please let them know that you were seen in the emergency department and have them  acquire all of your records so that they can discuss the findings with you and formulate a treatment plan to fully care for your new and ongoing problems.

## 2014-04-07 ENCOUNTER — Emergency Department (HOSPITAL_COMMUNITY): Payer: Medicare Other

## 2014-04-07 ENCOUNTER — Emergency Department (HOSPITAL_COMMUNITY)
Admission: EM | Admit: 2014-04-07 | Discharge: 2014-04-07 | Disposition: A | Discharge status: Home / Self Care | Payer: Medicare Other | Attending: Emergency Medicine | Admitting: Emergency Medicine

## 2014-04-07 ENCOUNTER — Encounter (HOSPITAL_COMMUNITY): Payer: Self-pay | Admitting: Emergency Medicine

## 2014-04-07 DIAGNOSIS — R079 Chest pain, unspecified: Secondary | ICD-10-CM | POA: Diagnosis not present

## 2014-04-07 DIAGNOSIS — Z87891 Personal history of nicotine dependence: Secondary | ICD-10-CM | POA: Diagnosis not present

## 2014-04-07 DIAGNOSIS — R55 Syncope and collapse: Secondary | ICD-10-CM | POA: Diagnosis present

## 2014-04-07 DIAGNOSIS — R42 Dizziness and giddiness: Secondary | ICD-10-CM | POA: Diagnosis not present

## 2014-04-07 DIAGNOSIS — Z79899 Other long term (current) drug therapy: Secondary | ICD-10-CM | POA: Diagnosis not present

## 2014-04-07 DIAGNOSIS — Z8719 Personal history of other diseases of the digestive system: Secondary | ICD-10-CM | POA: Diagnosis not present

## 2014-04-07 DIAGNOSIS — Z87828 Personal history of other (healed) physical injury and trauma: Secondary | ICD-10-CM | POA: Insufficient documentation

## 2014-04-07 DIAGNOSIS — G8929 Other chronic pain: Secondary | ICD-10-CM | POA: Insufficient documentation

## 2014-04-07 DIAGNOSIS — Z8739 Personal history of other diseases of the musculoskeletal system and connective tissue: Secondary | ICD-10-CM | POA: Insufficient documentation

## 2014-04-07 DIAGNOSIS — J449 Chronic obstructive pulmonary disease, unspecified: Secondary | ICD-10-CM | POA: Diagnosis not present

## 2014-04-07 DIAGNOSIS — R200 Anesthesia of skin: Secondary | ICD-10-CM | POA: Insufficient documentation

## 2014-04-07 LAB — I-STAT CHEM 8, ED
BUN: 18 mg/dL (ref 6–23)
CALCIUM ION: 1.11 mmol/L — AB (ref 1.12–1.23)
CHLORIDE: 102 meq/L (ref 96–112)
Creatinine, Ser: 0.6 mg/dL (ref 0.50–1.10)
Glucose, Bld: 104 mg/dL — ABNORMAL HIGH (ref 70–99)
HEMATOCRIT: 38 % (ref 36.0–46.0)
Hemoglobin: 12.9 g/dL (ref 12.0–15.0)
Potassium: 3.2 mEq/L — ABNORMAL LOW (ref 3.7–5.3)
Sodium: 141 mEq/L (ref 137–147)
TCO2: 26 mmol/L (ref 0–100)

## 2014-04-07 LAB — I-STAT TROPONIN, ED: TROPONIN I, POC: 0 ng/mL (ref 0.00–0.08)

## 2014-04-07 MED ORDER — MORPHINE SULFATE 4 MG/ML IJ SOLN
6.0000 mg | Freq: Once | INTRAMUSCULAR | Status: AC
  Administered 2014-04-07: 6 mg via INTRAMUSCULAR
  Filled 2014-04-07: qty 2

## 2014-04-07 NOTE — ED Notes (Signed)
Dr. Mesner at bedside   
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
## 2014-04-07 NOTE — ED Notes (Signed)
Upon completing orthostatic VS, pt reports she felt very dizzy when she was standing.

## 2014-04-09 ENCOUNTER — Encounter: Payer: Self-pay | Admitting: Physical Medicine & Rehabilitation

## 2014-05-07 ENCOUNTER — Ambulatory Visit: Payer: Medicare Other | Admitting: Physical Medicine & Rehabilitation

## 2014-05-11 ENCOUNTER — Ambulatory Visit (HOSPITAL_BASED_OUTPATIENT_CLINIC_OR_DEPARTMENT_OTHER): Payer: Medicare Other | Admitting: Physical Medicine & Rehabilitation

## 2014-05-11 ENCOUNTER — Encounter: Payer: Self-pay | Admitting: Physical Medicine & Rehabilitation

## 2014-05-11 ENCOUNTER — Encounter: Payer: Medicare Other | Attending: Physical Medicine & Rehabilitation

## 2014-05-11 ENCOUNTER — Other Ambulatory Visit: Payer: Self-pay | Admitting: Physical Medicine & Rehabilitation

## 2014-05-11 VITALS — BP 113/80 | HR 101 | Resp 14

## 2014-05-11 DIAGNOSIS — M5136 Other intervertebral disc degeneration, lumbar region: Secondary | ICD-10-CM | POA: Insufficient documentation

## 2014-05-11 DIAGNOSIS — G8929 Other chronic pain: Secondary | ICD-10-CM | POA: Insufficient documentation

## 2014-05-11 DIAGNOSIS — M5416 Radiculopathy, lumbar region: Secondary | ICD-10-CM

## 2014-05-11 DIAGNOSIS — M549 Dorsalgia, unspecified: Secondary | ICD-10-CM | POA: Insufficient documentation

## 2014-05-11 DIAGNOSIS — Z5181 Encounter for therapeutic drug level monitoring: Secondary | ICD-10-CM

## 2014-05-11 DIAGNOSIS — Z79899 Other long term (current) drug therapy: Secondary | ICD-10-CM

## 2014-05-11 NOTE — Addendum Note (Signed)
Addended by: Angela Nevin D on: 05/11/2014 02:01 PM   Modules accepted: Orders

## 2014-05-11 NOTE — Progress Notes (Signed)
Subjective:    Patient ID: Denise Ellis, female    DOB: 04/16/1981, 34 y.o.   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 Patient went through conservative treatment plan as well as medication management but ultimately was not able to return to work due to persisting pain. Patient was evaluated by 2 spine surgeons neither of whom recommended surgery. Patient also reports having had an epidural injection which made her back pain worse.  Patient reports having tried tramadol, hydrocodone, oxycodone for this problem. Currently not taking any narcotic pain medication on a regular basis.. Patient also reports having tried gabapentin which is listed as an allergy, Lyrica which was not helpful for her. Patient also states she tried Cymbalta which was not helpful.  Patient also takes Motrin between 11-1198 mg per day which is partially helpful  Social: On disability from work-related injury, married to husband who works today. She doesn't have anybody else that can drive her to appointments other than her husband.  Past medical history significant for Crohn's disease but she states this is diet controlled. Pain Inventory Average Pain 7 Pain Right Now 8 My pain is constant, sharp and burning  In the last 24 hours, has pain interfered with the following? General activity 10 Relation with others 10 Enjoyment of life 10 What TIME of day is your pain at its worst? evening Sleep (in general) Poor  Pain is worse with: walking, bending, sitting, inactivity, standing, unsure, some activites and weather Pain improves with: rest and medication Relief from Meds: 0  Mobility walk without assistance walk with assistance use a cane use a walker how many minutes can you walk? 10 ability to climb steps?  no do you drive?  no Do you have any goals in this area?  yes  Function disabled: date disabled  .  Neuro/Psych weakness numbness tingling trouble walking spasms dizziness anxiety  Prior Studies new visit CLINICAL DATA:  Lost balance and fell backward while walking in kitchen. Landed on back and buttocks, with severe diffuse back pain and right leg numbness. Cannot ambulate. Initial encounter.   EXAM: CT THORACIC AND LUMBAR SPINE WITHOUT CONTRAST   TECHNIQUE: Multidetector CT imaging of the thoracic and lumbar spine was performed without contrast. Multiplanar CT image reconstructions were also generated.   COMPARISON:  Lumbar and thoracic spine radiographs performed 01/30/2014   FINDINGS: CT THORACIC SPINE FINDINGS   There is no evidence of fracture or subluxation along the thoracic spine. Vertebral bodies demonstrate normal height and alignment. Intervertebral disc spaces are preserved. The bony foramina are unremarkable in appearance bilaterally. There is no evidence of foraminal narrowing.   The thyroid gland is unremarkable. The visualized portions of the mediastinum are within normal limits. Minimal bilateral atelectasis is noted.   The paraspinal musculature is unremarkable in appearance.   CT LUMBAR SPINE FINDINGS   There is no evidence of fracture or subluxation along the lumbar spine. Vertebral bodies demonstrate normal height and alignment. Intervertebral disc spaces are preserved. The bony foramina are unremarkable in appearance bilaterally.   There may be mild bulging of the disc at L4-L5 and L5-S1 on the right side, with mild associated foraminal narrowing. At L5-S1, this may impress on the exiting nerve root; would correlate with the level of the patient's symptoms. Minimal facet disease is noted at the lower lumbar spine.   Visualized bowel structures are unremarkable in appearance. Postoperative change is noted  about the cecum. The visualized portions of the kidneys are unremarkable. No vascular calcification is seen.   The paraspinal  musculature is unremarkable in appearance.   IMPRESSION: 1. No evidence of fracture or subluxation along the thoracic or lumbar spine. 2. There may be mild bulging of the disc at L4-L5 and L5-S1 on the right side, with mild associated foraminal narrowing. At L5-S1, this may impress on the exiting nerve root. Would correlate with the level of the patient's symptoms.  Physicians involved in your care new visit   Family History  Problem Relation Age of Onset  . Ovarian cancer Mother   . Heart disease Father   . Diabetes Mellitus I Father    History   Social History  . Marital Status: Married    Spouse Name: N/A    Number of Children: N/A  . Years of Education: N/A   Social History Main Topics  . Smoking status: Former Smoker -- 0.20 packs/day for 18 years    Types: Cigarettes    Quit date: 01/31/2009  . Smokeless tobacco: Never Used  . Alcohol Use: No  . Drug Use: None  . Sexual Activity: None   Other Topics Concern  . None   Social History Narrative   ** Merged History Encounter **       Past Surgical History  Procedure Laterality Date  . Appendectomy     Past Medical History  Diagnosis Date  . Crohn's disease   . Chronic back pain     Do NOT give narcotic RX!  CALLArlee Muslim, [phone], her pain specialist before giving any additional prescriptions  . COPD (chronic obstructive pulmonary disease)     possible.    . DDD (degenerative disc disease)   . Depression     refused psychiatry, counseling, or any medications other than benzos  . Anxiety   . Chronic leg pain     right leg "I don't have much feeling in it"   There were no vitals taken for this visit.  Opioid Risk Score: 1 Fall Risk Score: Moderate Fall Risk (6-13 points) (pt given fall safety prevention pamphlet during todays visit) Review of Systems  Constitutional:       Fever/chills  Gastrointestinal: Positive for nausea, vomiting and diarrhea.  Musculoskeletal: Positive for gait  problem.  Neurological: Positive for dizziness, weakness and numbness.       Tingling Spasms   Psychiatric/Behavioral: The patient is nervous/anxious.   All other systems reviewed and are negative.      Objective:   Physical Exam  Constitutional: She is oriented to person, place, and time. She appears well-developed and well-nourished.  overweight  HENT:  Head: Normocephalic and atraumatic.  Eyes: Conjunctivae and EOM are normal. Pupils are equal, round, and reactive to light.  Neck: Normal range of motion.  Neurological: She is alert and oriented to person, place, and time. She has normal strength. A sensory deficit is present. Coordination and gait normal.  Reflex Scores:      Tricep reflexes are 1+ on the right side and 1+ on the left side.      Bicep reflexes are 1+ on the right side and 1+ on the left side.      Brachioradialis reflexes are 1+ on the right side and 1+ on the left side.      Patellar reflexes are 2+ on the right side and 2+ on the left side.      Achilles reflexes are 2+ on the  right side and 2+ on the left side. Negative straight leg raising test  Reduced sensation in the right lower extremity In L3 L4 L5 and S1 dermatomal distribution  Motor strength is 5/5 bilateral deltoid, biceps, triceps, grip, hip flexors, knee extensors, ankle dorsiflexors and plantar flexors   Psychiatric: Cognition and memory are not impaired. She exhibits a depressed mood.  Nursing note and vitals reviewed.         Assessment & Plan:  1. Chronic low back pain, CT evidence of degenerative disc, With some potential impingement at L5-S1.  This may explain her chronic radicular pain in the right lower extremity. Patient has tried physical therapy, she's had limited improvements with pain medications, bad experience with an epidural injection. Also she's been evaluated by surgery and not felt to be a surgical candidate.  We explained limited options. I recommend core  strengthening exercises as well as lower extremity and back stretching exercises, please see patient instructions. We discussed referral to physical therapy but she does not have transportation since her husband works 7 AM to 7 PM Monday through Friday She does not have her own car   discussed the need to Perform urine drug screen to evaluate for illicit drugs as well as non-reported opiates.  Assuming that this looks okay would recommend starting with Tylenol 3 one tablet twice a day, this would allow her to make every three-month visits rather than monthly visits. May need to titrate up to Tylenol four if this is not effective.

## 2014-05-11 NOTE — Progress Notes (Deleted)
   Subjective:    Patient ID: Denise Ellis, female    DOB: 1980-09-03, 34 y.o.   MRN: [id number]  HPI    Review of Systems     Objective:   Physical Exam        Assessment & Plan:

## 2014-05-11 NOTE — Patient Instructions (Signed)
Back Exercises These exercises may help you when beginning to rehabilitate your injury. Your symptoms may resolve with or without further involvement from your physician, physical therapist or athletic trainer. While completing these exercises, remember:   Restoring tissue flexibility helps normal motion to return to the joints. This allows healthier, less painful movement and activity.  An effective stretch should be held for at least 30 seconds.  A stretch should never be painful. You should only feel a gentle lengthening or release in the stretched tissue. STRETCH - Extension, Prone on Elbows   Lie on your stomach on the floor, a bed will be too soft. Place your palms about shoulder width apart and at the height of your head.  Place your elbows under your shoulders. If this is too painful, stack pillows under your chest.  Allow your body to relax so that your hips drop lower and make contact more completely with the floor.  Hold this position for __________ seconds.  Slowly return to lying flat on the floor. Repeat __________ times. Complete this exercise __________ times per day.  RANGE OF MOTION - Extension, Prone Press Ups   Lie on your stomach on the floor, a bed will be too soft. Place your palms about shoulder width apart and at the height of your head.  Keeping your back as relaxed as possible, slowly straighten your elbows while keeping your hips on the floor. You may adjust the placement of your hands to maximize your comfort. As you gain motion, your hands will come more underneath your shoulders.  Hold this position __________ seconds.  Slowly return to lying flat on the floor. Repeat __________ times. Complete this exercise __________ times per day.  RANGE OF MOTION- Quadruped, Neutral Spine   Assume a hands and knees position on a firm surface. Keep your hands under your shoulders and your knees under your hips. You may place padding under your knees for  comfort.  Drop your head and point your tail bone toward the ground below you. This will round out your low back like an angry cat. Hold this position for __________ seconds.  Slowly lift your head and release your tail bone so that your back sags into a large arch, like an old horse.  Hold this position for __________ seconds.  Repeat this until you feel limber in your low back.  Now, find your "sweet spot." This will be the most comfortable position somewhere between the two previous positions. This is your neutral spine. Once you have found this position, tense your stomach muscles to support your low back.  Hold this position for __________ seconds. Repeat __________ times. Complete this exercise __________ times per day.  STRETCH - Flexion, Single Knee to Chest   Lie on a firm bed or floor with both legs extended in front of you.  Keeping one leg in contact with the floor, bring your opposite knee to your chest. Hold your leg in place by either grabbing behind your thigh or at your knee.  Pull until you feel a gentle stretch in your low back. Hold __________ seconds.  Slowly release your grasp and repeat the exercise with the opposite side. Repeat __________ times. Complete this exercise __________ times per day.  STRETCH - Hamstrings, Standing  Stand or sit and extend your right / left leg, placing your foot on a chair or foot stool  Keeping a slight arch in your low back and your hips straight forward.  Lead with your chest and   lean forward at the waist until you feel a gentle stretch in the back of your right / left knee or thigh. (When done correctly, this exercise requires leaning only a small distance.)  Hold this position for __________ seconds. Repeat __________ times. Complete this stretch __________ times per day. STRENGTHENING - Deep Abdominals, Pelvic Tilt   Lie on a firm bed or floor. Keeping your legs in front of you, bend your knees so they are both pointed  toward the ceiling and your feet are flat on the floor.  Tense your lower abdominal muscles to press your low back into the floor. This motion will rotate your pelvis so that your tail bone is scooping upwards rather than pointing at your feet or into the floor.  With a gentle tension and even breathing, hold this position for __________ seconds. Repeat __________ times. Complete this exercise __________ times per day.  STRENGTHENING - Abdominals, Crunches   Lie on a firm bed or floor. Keeping your legs in front of you, bend your knees so they are both pointed toward the ceiling and your feet are flat on the floor. Cross your arms over your chest.  Slightly tip your chin down without bending your neck.  Tense your abdominals and slowly lift your trunk high enough to just clear your shoulder blades. Lifting higher can put excessive stress on the low back and does not further strengthen your abdominal muscles.  Control your return to the starting position. Repeat __________ times. Complete this exercise __________ times per day.  STRENGTHENING - Quadruped, Opposite UE/LE Lift   Assume a hands and knees position on a firm surface. Keep your hands under your shoulders and your knees under your hips. You may place padding under your knees for comfort.  Find your neutral spine and gently tense your abdominal muscles so that you can maintain this position. Your shoulders and hips should form a rectangle that is parallel with the floor and is not twisted.  Keeping your trunk steady, lift your right hand no higher than your shoulder and then your left leg no higher than your hip. Make sure you are not holding your breath. Hold this position __________ seconds.  Continuing to keep your abdominal muscles tense and your back steady, slowly return to your starting position. Repeat with the opposite arm and leg. Repeat __________ times. Complete this exercise __________ times per day. Document Released:  04/27/2005 Document Revised: 07/02/2011 Document Reviewed: 07/22/2008 [employer] Patient Information 2015 [employer]. This information is not intended to replace advice given to you by your health care provider. Make sure you discuss any questions you have with your health care provider.  

## 2014-05-14 ENCOUNTER — Ambulatory Visit: Payer: Medicare Other | Admitting: Physical Medicine & Rehabilitation

## 2014-05-25 ENCOUNTER — Encounter: Payer: Medicare Other | Attending: Physical Medicine & Rehabilitation

## 2014-05-25 ENCOUNTER — Ambulatory Visit (HOSPITAL_BASED_OUTPATIENT_CLINIC_OR_DEPARTMENT_OTHER): Payer: Medicare Other | Admitting: Physical Medicine & Rehabilitation

## 2014-05-25 ENCOUNTER — Encounter: Payer: Self-pay | Admitting: Physical Medicine & Rehabilitation

## 2014-05-25 VITALS — HR 114 | Resp 14

## 2014-05-25 DIAGNOSIS — M5136 Other intervertebral disc degeneration, lumbar region: Secondary | ICD-10-CM | POA: Diagnosis not present

## 2014-05-25 DIAGNOSIS — G8929 Other chronic pain: Secondary | ICD-10-CM | POA: Insufficient documentation

## 2014-05-25 DIAGNOSIS — G894 Chronic pain syndrome: Secondary | ICD-10-CM | POA: Insufficient documentation

## 2014-05-25 DIAGNOSIS — M5416 Radiculopathy, lumbar region: Secondary | ICD-10-CM | POA: Diagnosis present

## 2014-05-25 DIAGNOSIS — M549 Dorsalgia, unspecified: Secondary | ICD-10-CM | POA: Insufficient documentation

## 2014-05-25 DIAGNOSIS — Z5181 Encounter for therapeutic drug level monitoring: Secondary | ICD-10-CM | POA: Insufficient documentation

## 2014-05-25 MED ORDER — ACETAMINOPHEN-CODEINE #3 300-30 MG PO TABS: 1.0000 | ORAL_TABLET | Freq: Three times a day (TID) | ORAL | Status: DC | PRN

## 2014-05-25 NOTE — Patient Instructions (Signed)
Continue back exercises

## 2014-05-25 NOTE — Progress Notes (Signed)
Subjective:    Patient ID: Denise Ellis, female    DOB: 11-22-1980, 34 y.o.   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 Patient went through conservative treatment plan as well as medication management but ultimately was not able to return to work due to persisting pain. Patient was evaluated by 2 spine surgeons neither of whom recommended surgery. Patient also reports having had an epidural injection which made her back pain worse.  Patient reports having tried tramadol, hydrocodone, oxycodone for this problem. Currently not taking any narcotic pain medication on a regular basis.. Patient also reports having tried gabapentin which is listed as an allergy, Lyrica which was not helpful for her. Patient also states she tried Cymbalta which was not helpful.  Patient also takes Motrin between 11-1198 mg per day which is partially helpful  Social: On disability from work-related injury, married to husband who works today. She doesn't have anybody else that can drive her to appointments other than her husband.  HPI  Reviewed her last ED notes. Patient has had multiple ED visits for back pain as well as abdominal pain. On some occasions she has received narcotic analgesic medications. Last ED note indicates that there was some concerns about drug-seeking behavior and possibly addiction. Patient states that she doesn't feel like she would have to use the ED very often if she gets established with the pain medicine provider.  Urine drug screen 05/11/2014 was appropriate with the exception of fentanyl which would be explained by administration in the Emergency Department 05/09/2014  Pain Inventory Average Pain 8 Pain Right Now 8 My pain is constant, sharp and stabbing  In the last 24 hours, has pain interfered with the following? General activity 10 Relation with others 10 Enjoyment of life 10 What TIME of  day is your pain at its worst? evening Sleep (in general) Poor  Pain is worse with: walking, bending, inactivity, standing and some activites Pain improves with: rest and medication Relief from Meds: 2  Mobility walk without assistance how many minutes can you walk? 10 ability to climb steps?  no do you drive?  no  Function disabled: date disabled .  Neuro/Psych weakness numbness tingling trouble walking spasms anxiety  Prior Studies Any changes since last visit?  no  Physicians involved in your care Any changes since last visit?  no   Family History  Problem Relation Age of Onset  . Ovarian cancer Mother   . Heart disease Father   . Diabetes Mellitus I Father    History   Social History  . Marital Status: Married    Spouse Name: N/A    Number of Children: N/A  . Years of Education: N/A   Social History Main Topics  . Smoking status: Former Smoker -- 0.20 packs/day for 18 years    Types: Cigarettes    Quit date: 01/31/2009  . Smokeless tobacco: Never Used  . Alcohol Use: No  . Drug Use: None  . Sexual Activity: None   Other Topics Concern  . None   Social History Narrative   ** Merged History Encounter **       Past Surgical History  Procedure Laterality Date  . Appendectomy     Past Medical History  Diagnosis Date  . Crohn's disease   . Chronic back pain     Do NOT give narcotic RX!  CALLArlee Muslim, [phone], her pain specialist before  giving any additional prescriptions  . COPD (chronic obstructive pulmonary disease)     possible.    . DDD (degenerative disc disease)   . Depression     refused psychiatry, counseling, or any medications other than benzos  . Anxiety   . Chronic leg pain     right leg "I don't have much feeling in it"   Pulse 114  Resp 14  SpO2 98%  Opioid Risk Score:   Fall Risk Score: Moderate Fall Risk (6-13 points)  Review of Systems  HENT: Negative.   Eyes: Negative.   Respiratory: Negative.     Cardiovascular: Negative.   Gastrointestinal: Positive for nausea and vomiting.       Comes on when she is in pain  Endocrine: Negative.   Genitourinary: Negative.   Musculoskeletal: Positive for myalgias, back pain and arthralgias.  Skin: Negative.   Neurological: Positive for weakness and numbness.       Tingling, trouble walking, spasms  Psychiatric/Behavioral: The patient is nervous/anxious.        Objective:   Physical Exam  Constitutional: She is oriented to person, place, and time. She appears well-developed.  obese  HENT:  Head: Normocephalic and atraumatic.  Eyes: Conjunctivae and EOM are normal. Pupils are equal, round, and reactive to light.  Neck: Normal range of motion.  Neurological: She is alert and oriented to person, place, and time. She has normal reflexes.  Psychiatric: She has a normal mood and affect.  Nursing note and vitals reviewed.  Tenderness palpation lumbar spine with light palpation. There is decreased lumbar range of motion with flexion extension lateral bending and rotation Straight leg raising test is negative Patient with decreased sensation right foot compared to left foot. Deep tendon reflexes are 2+ bilateral lower extremities Mood and affect are appropriate       Assessment & Plan:  1. Chronic low back pain, CT evidence of degenerative disc, With some potential impingement at L5-S1.  This may explain her chronic radicular pain in the right lower extremity. Patient has tried physical therapy, she's had limited improvements with pain medications, bad experience with an epidural injection. Also she's been evaluated by surgery and not felt to be a surgical candidate.  We explained limited options. I recommend core strengthening exercises as well as lower extremity and back stretching exercises, We discussed this would help progression of immobility  We discussed the patient is now under a controlled substance agreement with this office and  getting pain medications from other sources would violate this and Result in discharge, Patient and her husband are in agreement  We discussed trial of Tylenol No. 3 one by mouth 3 times a day, and if she fails this would go up to Tylenol No. 4 one by mouth 3 times a day. Reassess in 2 months With UDS

## 2014-06-01 NOTE — Progress Notes (Signed)
Urine drug screen for this encounter is positive for tramadol, metabolites of diazepam for which she has had rx.  Presence of fentanyl but pt says she was in hospital a day or two at [facility] and was given fentanyl.

## 2014-06-15 ENCOUNTER — Encounter (HOSPITAL_COMMUNITY): Payer: Self-pay | Admitting: Emergency Medicine

## 2014-06-15 ENCOUNTER — Emergency Department (HOSPITAL_COMMUNITY): Payer: Medicare Other

## 2014-06-15 ENCOUNTER — Emergency Department (HOSPITAL_COMMUNITY)
Admission: EM | Admit: 2014-06-15 | Discharge: 2014-06-16 | Disposition: A | Discharge status: Home / Self Care | Payer: Medicare Other | Attending: Emergency Medicine | Admitting: Emergency Medicine

## 2014-06-15 DIAGNOSIS — G8929 Other chronic pain: Secondary | ICD-10-CM | POA: Diagnosis not present

## 2014-06-15 DIAGNOSIS — Z8673 Personal history of transient ischemic attack (TIA), and cerebral infarction without residual deficits: Secondary | ICD-10-CM | POA: Diagnosis not present

## 2014-06-15 DIAGNOSIS — Z79899 Other long term (current) drug therapy: Secondary | ICD-10-CM | POA: Insufficient documentation

## 2014-06-15 DIAGNOSIS — Z8719 Personal history of other diseases of the digestive system: Secondary | ICD-10-CM | POA: Insufficient documentation

## 2014-06-15 DIAGNOSIS — Z87891 Personal history of nicotine dependence: Secondary | ICD-10-CM | POA: Diagnosis not present

## 2014-06-15 DIAGNOSIS — R51 Headache: Secondary | ICD-10-CM | POA: Insufficient documentation

## 2014-06-15 DIAGNOSIS — J449 Chronic obstructive pulmonary disease, unspecified: Secondary | ICD-10-CM | POA: Insufficient documentation

## 2014-06-15 DIAGNOSIS — R0789 Other chest pain: Secondary | ICD-10-CM | POA: Diagnosis not present

## 2014-06-15 DIAGNOSIS — F419 Anxiety disorder, unspecified: Secondary | ICD-10-CM | POA: Diagnosis not present

## 2014-06-15 DIAGNOSIS — R0602 Shortness of breath: Secondary | ICD-10-CM

## 2014-06-15 DIAGNOSIS — R079 Chest pain, unspecified: Secondary | ICD-10-CM

## 2014-06-15 LAB — CBC
HCT: 37.8 % (ref 36.0–46.0)
Hemoglobin: 12 g/dL (ref 12.0–15.0)
MCH: 27.3 pg (ref 26.0–34.0)
MCHC: 31.7 g/dL (ref 30.0–36.0)
MCV: 85.9 fL (ref 78.0–100.0)
Platelets: 367 10*3/uL (ref 150–400)
RBC: 4.4 MIL/uL (ref 3.87–5.11)
RDW: 14.7 % (ref 11.5–15.5)
WBC: 12.3 10*3/uL — ABNORMAL HIGH (ref 4.0–10.5)

## 2014-06-15 LAB — D-DIMER, QUANTITATIVE (NOT AT ARMC): D DIMER QUANT: 0.65 ug{FEU}/mL — AB (ref 0.00–0.48)

## 2014-06-15 LAB — BASIC METABOLIC PANEL
Anion gap: 9 (ref 5–15)
BUN: 13 mg/dL (ref 6–23)
CO2: 23 mmol/L (ref 19–32)
Calcium: 8.6 mg/dL (ref 8.4–10.5)
Chloride: 109 mmol/L (ref 96–112)
Creatinine, Ser: 0.66 mg/dL (ref 0.50–1.10)
GFR calc Af Amer: >90 mL/min (ref >90)
GLUCOSE: 80 mg/dL (ref 70–99)
Potassium: 3.4 mmol/L — ABNORMAL LOW (ref 3.5–5.1)
Sodium: 141 mmol/L (ref 135–145)

## 2014-06-15 LAB — HEPATIC FUNCTION PANEL
ALK PHOS: 94 U/L (ref 39–117)
ALT: 15 U/L (ref 0–35)
AST: 17 U/L (ref 0–37)
Albumin: 3.3 g/dL — ABNORMAL LOW (ref 3.5–5.2)
Total Bilirubin: 0.3 mg/dL (ref 0.3–1.2)
Total Protein: 6.7 g/dL (ref 6.0–8.3)

## 2014-06-15 LAB — I-STAT TROPONIN, ED: Troponin i, poc: 0 ng/mL (ref 0.00–0.08)

## 2014-06-15 LAB — TROPONIN I: Troponin I: <0.03 ng/mL (ref <0.031)

## 2014-06-15 LAB — LIPASE, BLOOD: LIPASE: 20 U/L (ref 11–59)

## 2014-06-15 MED ORDER — SODIUM CHLORIDE 0.9 % IV BOLUS (SEPSIS)
1000.0000 mL | Freq: Once | INTRAVENOUS | Status: AC
  Administered 2014-06-15: 1000 mL via INTRAVENOUS

## 2014-06-15 MED ORDER — KETOROLAC TROMETHAMINE 30 MG/ML IJ SOLN
30.0000 mg | Freq: Once | INTRAMUSCULAR | Status: AC
Start: 2014-06-15 — End: 2014-06-15
  Administered 2014-06-15: 30 mg via INTRAVENOUS
  Filled 2014-06-15: qty 1

## 2014-06-15 MED ORDER — IOHEXOL 350 MG/ML SOLN
100.0000 mL | Freq: Once | INTRAVENOUS | Status: AC | PRN
  Administered 2014-06-15: 100 mL via INTRAVENOUS

## 2014-06-15 NOTE — ED Notes (Signed)
Patient state she had a stroke a few years ago.

## 2014-06-15 NOTE — ED Notes (Signed)
From home via EMS, CP onset last night, EMS gave 324 ASA and 2 nitros with no relief, VSS, A/O X4, ambulatory and in NAD

## 2014-06-15 NOTE — ED Provider Notes (Signed)
CSN: [id number]     Arrival date & time 06/15/14  1958 History   First MD Initiated Contact with Patient 06/15/14 2112     Chief Complaint  Patient presents with  . Chest Pain     (Consider location/radiation/quality/duration/timing/severity/associated sxs/prior Treatment) HPI Comments: From home with substernal chest pain onset last night has been constant. It isn't Center for just and radiates to her back. Denies any radiation to arms or neck. Associated with some nausea and one episode of vomiting. No shortness of breath. She has never had panic this in the past. It is worse with palpation and movement. She received aspirin and nitroglycerin by EMS which has lowered her blood pressure and given her headache. Patient with history of Crohn's disease, chronic back pain, depression and questionable previous stroke. She is a former smoker. She denies any cardiac history.  The history is provided by the patient and a relative.    Past Medical History  Diagnosis Date  . Crohn's disease   . Chronic back pain     Do NOT give narcotic RX!  CALLArlee Muslim, [phone], her pain specialist before giving any additional prescriptions  . COPD (chronic obstructive pulmonary disease)     possible.    . DDD (degenerative disc disease)   . Depression     refused psychiatry, counseling, or any medications other than benzos  . Anxiety   . Chronic leg pain     right leg "I don't have much feeling in it"   Past Surgical History  Procedure Laterality Date  . Appendectomy     Family History  Problem Relation Age of Onset  . Ovarian cancer Mother   . Heart disease Father   . Diabetes Mellitus I Father    History  Substance Use Topics  . Smoking status: Former Smoker -- 0.20 packs/day for 18 years    Types: Cigarettes    Quit date: 01/31/2009  . Smokeless tobacco: Never Used  . Alcohol Use: No   OB History    No data available     Review of Systems  Constitutional: Negative for  fever, activity change and appetite change.  HENT: Negative for congestion and rhinorrhea.   Respiratory: Positive for chest tightness. Negative for cough and shortness of breath.   Cardiovascular: Positive for chest pain.  Gastrointestinal: Negative for nausea, vomiting and abdominal pain.  Genitourinary: Negative for dysuria, hematuria, vaginal bleeding and vaginal discharge.  Musculoskeletal: Negative for myalgias, back pain and arthralgias.  Skin: Negative for rash.  Neurological: Positive for headaches. Negative for dizziness, weakness and light-headedness.  A complete 10 system review of systems was obtained and all systems are negative except as noted in the HPI and PMH.      Allergies  Gabapentin; Gadolinium derivatives; and Naproxen  Home Medications   Prior to Admission medications   Medication Sig Start Date End Date Taking? Authorizing Provider  acetaminophen-codeine (TYLENOL #3) 300-30 MG per tablet Take 1 tablet by mouth every 8 (eight) hours as needed for moderate pain. 05/25/14  Yes Erick Colace, MD  albuterol (PROVENTIL) (2.5 MG/3ML) 0.083% nebulizer solution Take 2.5 mg by nebulization 2 (two) times daily as needed for wheezing or shortness of breath.   Yes Historical Provider, MD  clonazePAM (KLONOPIN) 2 MG tablet Take 2 mg by mouth 2 (two) times daily. Alternates with valium every month   Yes Historical Provider, MD  diazepam (VALIUM) 5 MG tablet Take 5 mg by mouth 2 (two) times daily.  Alternates with klonopin every month   Yes Historical Provider, MD  ibuprofen (ADVIL,MOTRIN) 200 MG tablet Take 800 mg by mouth every 8 (eight) hours as needed for moderate pain.    Yes Historical Provider, MD  oxyCODONE-acetaminophen (PERCOCET) 5-325 MG per tablet Take 1 tablet by mouth every 4 (four) hours as needed. 04/03/14  Yes Vida Roller, MD  polyethylene glycol powder (MIRALAX) powder Take 17 g by mouth daily. Patient taking differently: Take 17 g by mouth daily as needed  for moderate constipation.  02/02/14  Yes Renae Fickle, MD  hydrocortisone (CORTEF) 10 MG tablet Take one and a half tabs at 6AM and one tab at 1PM every day. Patient not taking: Reported on 06/16/2014 02/02/14   Renae Fickle, MD  hydrOXYzine (ATARAX/VISTARIL) 50 MG tablet Take 50 mg by mouth 2 (two) times daily as needed for anxiety.     Historical Provider, MD  methylPREDNIsolone (MEDROL DOSPACK) 4 MG tablet follow package directions.  Please skip DAY 1 and 2 and start with DAY 3. Patient not taking: Reported on 06/16/2014 02/04/14   Catarina Hartshorn, MD  Tiotropium Bromide Monohydrate (SPIRIVA RESPIMAT) 2.5 MCG/ACT AERS Inhale 2 puffs into the lungs daily.    Historical Provider, MD   BP 102/71 mmHg  Pulse 91  Temp(Src) 98 F (36.7 C) (Oral)  Resp 23  Ht 5\' 6"  (1.676 m)  Wt 213 lb (96.616 kg)  BMI 34.40 kg/m2  SpO2 100%  LMP 06/14/2014 Physical Exam  Constitutional: She is oriented to person, place, and time. She appears well-developed and well-nourished. No distress.  HENT:  Head: Normocephalic and atraumatic.  Mouth/Throat: Oropharynx is clear and moist. No oropharyngeal exudate.  Eyes: Conjunctivae and EOM are normal. Pupils are equal, round, and reactive to light.  Neck: Normal range of motion. Neck supple.  No meningismus.  Cardiovascular: Normal rate, regular rhythm, normal heart sounds and intact distal pulses.   No murmur heard. Pulmonary/Chest: Effort normal and breath sounds normal. No respiratory distress. She exhibits tenderness.  Reproducible chest wall tenderness  Abdominal: Soft. There is no tenderness. There is no rebound and no guarding.  Musculoskeletal: Normal range of motion. She exhibits no edema or tenderness.  Neurological: She is alert and oriented to person, place, and time. No cranial nerve deficit. She exhibits normal muscle tone. Coordination normal.  No ataxia on finger to nose bilaterally. No pronator drift. 5/5 strength throughout. CN 2-12 intact.  Negative Romberg. Equal grip strength. Sensation intact. Gait is normal.   Skin: Skin is warm.  Psychiatric: She has a normal mood and affect. Her behavior is normal.  Nursing note and vitals reviewed.   ED Course  Procedures (including critical care time) Labs Review Labs Reviewed  CBC - Abnormal; Notable for the following:    WBC 12.3 (*)    All other components within normal limits  BASIC METABOLIC PANEL - Abnormal; Notable for the following:    Potassium 3.4 (*)    All other components within normal limits  HEPATIC FUNCTION PANEL - Abnormal; Notable for the following:    Albumin 3.3 (*)    All other components within normal limits  D-DIMER, QUANTITATIVE - Abnormal; Notable for the following:    D-Dimer, Quant 0.65 (*)    All other components within normal limits  LIPASE, BLOOD  TROPONIN I  TROPONIN I  Rosezena Sensor, ED    Imaging Review Dg Chest 2 View  06/15/2014   CLINICAL DATA:  Chest pain.  EXAM: CHEST  2  VIEW  COMPARISON:  April 07, 2014.  FINDINGS: The heart size and mediastinal contours are within normal limits. Both lungs are clear. No pneumothorax or pleural effusion is noted. The visualized skeletal structures are unremarkable.  IMPRESSION: No acute cardiopulmonary abnormality seen.   Electronically Signed   By: Lupita Raider, M.D.   On: 06/15/2014 21:29   Ct Angio Chest Pe W/cm &/or Wo Cm  06/16/2014   CLINICAL DATA:  Chest pain and shortness of breath for 2 days.  EXAM: CT ANGIOGRAPHY CHEST WITH CONTRAST  TECHNIQUE: Multidetector CT imaging of the chest was performed using the standard protocol during bolus administration of intravenous contrast. Multiplanar CT image reconstructions and MIPs were obtained to evaluate the vascular anatomy.  CONTRAST:  OMNIPAQUE IOHEXOL 350 MG/ML SOLN  COMPARISON:  05/09/2014 CT abdomen and pelvis.  FINDINGS: All technically adequate study with good opacification of the central and segmental pulmonary arteries. No focal  filling defects demonstrated. No evidence of significant pulmonary embolus.  Normal heart size. Normal caliber thoracic aorta. No aortic dissection. Great vessel origins are patent. Esophagus is decompressed. No significant lymphadenopathy in the chest.  Respiratory motion artifact limits evaluation of the lungs. There is linear atelectasis in both lung bases. No suggestion of focal consolidation. No pleural effusion. No pneumothorax. Airways appear patent.  Included portions of the upper abdominal organs are grossly unremarkable. No destructive bone lesions.  Review of the MIP images confirms the above findings.  IMPRESSION: No evidence of significant pulmonary embolus. Atelectasis in the lung bases. No evidence of active pulmonary disease.   Electronically Signed   By: Burman Nieves M.D.   On: 06/16/2014 00:13     EKG Interpretation   Date/Time:  Tuesday June 15 2014 19:58:24 EST Ventricular Rate:  100 PR Interval:  150 QRS Duration: 82 QT Interval:  338 QTC Calculation: 436 R Axis:   108 Text Interpretation:  Normal sinus rhythm Rightward axis Cannot rule out  Anterior infarct , age undetermined Abnormal ECG No significant change was  found Confirmed by Manus Gunning  MD, Deena Shaub [phone]) on 06/15/2014 9:34:50 PM      MDM   Final diagnoses:  Chest pain  Chest wall pain   chest wall pain since yesterday that is constant and worse with palpation. EKG without acute ischemia. Chest x-ray is negative.  Pain is reproducible to palpation. Hypotension likely secondary to nitroglycerin use. Patient given IV fluids. Ultrasound shows no evidence of tamponade or pericardial effusion.  Negative for PE.  Pain is atypical for ACS, PE or dissection. Second troponin pending at time of sign out to Dr. Rhunette Croft.  BP 102/71 mmHg  Pulse 91  Temp(Src) 98 F (36.7 C) (Oral)  Resp 23  Ht  (1.676 m)  Wt 213 lb (96.616 kg)  BMI 34.40 kg/m2  SpO2 100%  LMP 06/14/2014      EMERGENCY  DEPARTMENT [place] CARDIAC EXAM "Study: Limited Ultrasound of the heart and pericardium"  INDICATIONS:Hypotension Multiple views of the heart and pericardium are obtained with a multi-frequency probe.  PERFORMED XB:JYNWGN  IMAGES ARCHIVED?: Yes  FINDINGS: No pericardial effusion, Normal contractility and Tamponade physiology absent  LIMITATIONS:  Body habitus and Emergent procedure  VIEWS USED: Subcostal 4 chamber and Parasternal long axis  INTERPRETATION: Cardiac activity present, Pericardial effusioin absent and Cardiac tamponade absent  COMMENT:  Limited by body habitus.     Glynn Octave, MD 06/16/14 [phone]

## 2014-06-15 NOTE — ED Notes (Signed)
Reported bp of 80s/60s to Dr. Manus Gunning, received asa and nitrox2 with EMS. Also reports vomiting prior to arrival, wbc of 12. MD acknowledges, no new orders at this time. Patient currently showing no signs of distress.

## 2014-06-16 LAB — TROPONIN I: Troponin I: <0.03 ng/mL (ref <0.031)

## 2014-06-16 MED ORDER — ONDANSETRON HCL 4 MG/2ML IJ SOLN: 4.0000 mg | Freq: Once | INTRAMUSCULAR | Status: DC

## 2014-06-16 MED ORDER — ACETAMINOPHEN 325 MG PO TABS
650.0000 mg | ORAL_TABLET | Freq: Once | ORAL | Status: AC
  Administered 2014-06-16: 650 mg via ORAL
  Filled 2014-06-16: qty 2

## 2014-06-16 MED ORDER — ONDANSETRON 4 MG PO TBDP
4.0000 mg | ORAL_TABLET | Freq: Once | ORAL | Status: AC
  Administered 2014-06-16: 4 mg via ORAL
  Filled 2014-06-16: qty 1

## 2014-06-16 NOTE — Discharge Instructions (Signed)
We saw you in the ER for the chest pain/shortness of breath. °All of our cardiac workup is normal, including labs, EKG and chest X-RAY are normal. °We are not sure what is causing your discomfort, but we feel comfortable sending you home at this time. The workup in the ER is not complete, and you should follow up with your primary care doctor for further evaluation. ° ° °Chest Pain (Nonspecific) °It is often hard to give a specific diagnosis for the cause of chest pain. There is always a chance that your pain could be related to something serious, such as a heart attack or a blood clot in the lungs. You need to follow up with your health care provider for further evaluation. °CAUSES  °· Heartburn. °· Pneumonia or bronchitis. °· Anxiety or stress. °· Inflammation around your heart (pericarditis) or lung (pleuritis or pleurisy). °· A blood clot in the lung. °· A collapsed lung (pneumothorax). It can develop suddenly on its own (spontaneous pneumothorax) or from trauma to the chest. °· Shingles infection (herpes zoster virus). °The chest wall is composed of bones, muscles, and cartilage. Any of these can be the source of the pain. °· The bones can be bruised by injury. °· The muscles or cartilage can be strained by coughing or overwork. °· The cartilage can be affected by inflammation and become sore (costochondritis). °DIAGNOSIS  °Lab tests or other studies may be needed to find the cause of your pain. Your health care provider may have you take a test called an ambulatory electrocardiogram (ECG). An ECG records your heartbeat patterns over a 24-hour period. You may also have other tests, such as: °· Transthoracic echocardiogram (TTE). During echocardiography, sound waves are used to evaluate how blood flows through your heart. °· Transesophageal echocardiogram (TEE). °· Cardiac monitoring. This allows your health care provider to monitor your heart rate and rhythm in real time. °· Holter monitor. This is a portable  device that records your heartbeat and can help diagnose heart arrhythmias. It allows your health care provider to track your heart activity for several days, if needed. °· Stress tests by exercise or by giving medicine that makes the heart beat faster. °TREATMENT  °· Treatment depends on what may be causing your chest pain. Treatment may include: °¨ Acid blockers for heartburn. °¨ Anti-inflammatory medicine. °¨ Pain medicine for inflammatory conditions. °¨ Antibiotics if an infection is present. °· You may be advised to change lifestyle habits. This includes stopping smoking and avoiding alcohol, caffeine, and chocolate. °· You may be advised to keep your head raised (elevated) when sleeping. This reduces the chance of acid going backward from your stomach into your esophagus. °Most of the time, nonspecific chest pain will improve within 2-3 days with rest and mild pain medicine.  °HOME CARE INSTRUCTIONS  °· If antibiotics were prescribed, take them as directed. Finish them even if you start to feel better. °· For the next few days, avoid physical activities that bring on chest pain. Continue physical activities as directed. °· Do not use any tobacco products, including cigarettes, chewing tobacco, or electronic cigarettes. °· Avoid drinking alcohol. °· Only take medicine as directed by your health care provider. °· Follow your health care provider's suggestions for further testing if your chest pain does not go away. °· Keep any follow-up appointments you made. If you do not go to an appointment, you could develop lasting (chronic) problems with pain. If there is any problem keeping an appointment, call to reschedule. °SEEK   MEDICAL CARE IF:  °· Your chest pain does not go away, even after treatment. °· You have a rash with blisters on your chest. °· You have a fever. °SEEK IMMEDIATE MEDICAL CARE IF:  °· You have increased chest pain or pain that spreads to your arm, neck, jaw, back, or abdomen. °· You have  shortness of breath. °· You have an increasing cough, or you cough up blood. °· You have severe back or abdominal pain. °· You feel nauseous or vomit. °· You have severe weakness. °· You faint. °· You have chills. °This is an emergency. Do not wait to see if the pain will go away. Get medical help at once. Call your local emergency services (911 in [place] Do not drive yourself to the hospital. °MAKE SURE YOU:  °· Understand these instructions. °· Will watch your condition. °· Will get help right away if you are not doing well or get worse. °Document Released: 01/17/2005 Document Revised: 04/14/2013 Document Reviewed: 11/13/2007 °[employer]® Patient Information ©2015 [employer]. This information is not intended to replace advice given to you by your health care provider. Make sure you discuss any questions you have with your health care provider. ° °

## 2014-06-16 NOTE — ED Notes (Signed)
Pt. Left with all belongings 

## 2014-07-09 ENCOUNTER — Telehealth: Payer: Self-pay | Admitting: *Deleted

## 2014-07-09 NOTE — Telephone Encounter (Signed)
Denise Ellis has codeine, not hydrocodone, so she can use for cough this time , in future have pt find a non narcotic  alternative with help of PCP

## 2014-07-09 NOTE — Telephone Encounter (Signed)
Would recommend different medication, the codeine she is on already has cough suppressant affects

## 2014-07-09 NOTE — Telephone Encounter (Signed)
Denise Ellis called and has been to her doctor and dx with bronchitis and has been prescribed cough syrup with narcotic Cheritussin-- I told her I would need to get the approval from Dr Wynn Banker before she can fill rx.  She says they already have filled it.  I told her I will have to discuss with Dr Wynn Banker and will call her back to let her know if she has permission to take this medication

## 2014-07-09 NOTE — Telephone Encounter (Signed)
Notified. 

## 2014-07-09 NOTE — Telephone Encounter (Signed)
They have already filled the rx.  So what do I tell them?

## 2014-07-12 ENCOUNTER — Telehealth: Payer: Self-pay | Admitting: *Deleted

## 2014-07-12 NOTE — Telephone Encounter (Signed)
Just and FYI. Called to inform us that Skyla ws still sick with sore throat and ear infection and bronchitis and he was taking her back to the ED or urgent care.

## 2014-07-22 ENCOUNTER — Emergency Department (HOSPITAL_COMMUNITY): Payer: Medicare Other

## 2014-07-22 ENCOUNTER — Emergency Department (HOSPITAL_COMMUNITY)
Admission: EM | Admit: 2014-07-22 | Discharge: 2014-07-23 | Disposition: A | Discharge status: Home / Self Care | Payer: Medicare Other | Attending: Emergency Medicine | Admitting: Emergency Medicine

## 2014-07-22 ENCOUNTER — Encounter (HOSPITAL_COMMUNITY): Payer: Self-pay

## 2014-07-22 DIAGNOSIS — R112 Nausea with vomiting, unspecified: Secondary | ICD-10-CM

## 2014-07-22 DIAGNOSIS — R200 Anesthesia of skin: Secondary | ICD-10-CM | POA: Insufficient documentation

## 2014-07-22 DIAGNOSIS — F419 Anxiety disorder, unspecified: Secondary | ICD-10-CM | POA: Diagnosis not present

## 2014-07-22 DIAGNOSIS — R51 Headache: Secondary | ICD-10-CM | POA: Diagnosis not present

## 2014-07-22 DIAGNOSIS — Z8739 Personal history of other diseases of the musculoskeletal system and connective tissue: Secondary | ICD-10-CM | POA: Diagnosis not present

## 2014-07-22 DIAGNOSIS — G8929 Other chronic pain: Secondary | ICD-10-CM

## 2014-07-22 DIAGNOSIS — R197 Diarrhea, unspecified: Secondary | ICD-10-CM | POA: Diagnosis not present

## 2014-07-22 DIAGNOSIS — M542 Cervicalgia: Secondary | ICD-10-CM

## 2014-07-22 DIAGNOSIS — F329 Major depressive disorder, single episode, unspecified: Secondary | ICD-10-CM | POA: Diagnosis not present

## 2014-07-22 DIAGNOSIS — Z7952 Long term (current) use of systemic steroids: Secondary | ICD-10-CM | POA: Diagnosis not present

## 2014-07-22 DIAGNOSIS — G894 Chronic pain syndrome: Secondary | ICD-10-CM

## 2014-07-22 DIAGNOSIS — R0789 Other chest pain: Secondary | ICD-10-CM | POA: Diagnosis not present

## 2014-07-22 DIAGNOSIS — Z79899 Other long term (current) drug therapy: Secondary | ICD-10-CM | POA: Insufficient documentation

## 2014-07-22 DIAGNOSIS — Z87891 Personal history of nicotine dependence: Secondary | ICD-10-CM | POA: Diagnosis not present

## 2014-07-22 DIAGNOSIS — K219 Gastro-esophageal reflux disease without esophagitis: Secondary | ICD-10-CM

## 2014-07-22 DIAGNOSIS — R079 Chest pain, unspecified: Secondary | ICD-10-CM

## 2014-07-22 DIAGNOSIS — J449 Chronic obstructive pulmonary disease, unspecified: Secondary | ICD-10-CM | POA: Diagnosis not present

## 2014-07-22 LAB — CBC
HEMATOCRIT: 40.7 % (ref 36.0–46.0)
Hemoglobin: 12.9 g/dL (ref 12.0–15.0)
MCH: 28.2 pg (ref 26.0–34.0)
MCHC: 31.7 g/dL (ref 30.0–36.0)
MCV: 88.9 fL (ref 78.0–100.0)
Platelets: 371 10*3/uL (ref 150–400)
RBC: 4.58 MIL/uL (ref 3.87–5.11)
RDW: 16 % — ABNORMAL HIGH (ref 11.5–15.5)
WBC: 16 10*3/uL — ABNORMAL HIGH (ref 4.0–10.5)

## 2014-07-22 LAB — BASIC METABOLIC PANEL
Anion gap: 11 (ref 5–15)
BUN: 15 mg/dL (ref 6–23)
CALCIUM: 10.1 mg/dL (ref 8.4–10.5)
CO2: 22 mmol/L (ref 19–32)
CREATININE: 0.55 mg/dL (ref 0.50–1.10)
Chloride: 104 mmol/L (ref 96–112)
GFR calc non Af Amer: >90 mL/min (ref >90)
Glucose, Bld: 89 mg/dL (ref 70–99)
Potassium: 4.3 mmol/L (ref 3.5–5.1)
Sodium: 137 mmol/L (ref 135–145)

## 2014-07-22 LAB — I-STAT TROPONIN, ED: TROPONIN I, POC: 0 ng/mL (ref 0.00–0.08)

## 2014-07-22 MED ORDER — GI COCKTAIL ~~LOC~~
30.0000 mL | Freq: Once | ORAL | Status: AC
  Administered 2014-07-23: 30 mL via ORAL
  Filled 2014-07-22: qty 30

## 2014-07-22 NOTE — ED Notes (Signed)
Patient transported to X-ray 

## 2014-07-22 NOTE — ED Notes (Signed)
Pt presents with c/o chest pain and neck pain. Pt reports the chest pain started earlier today. Pt c/o pain on both sides of her neck. Pt reports she is supposed to be wearing oxygen at 3L all the time but did not bring a tank with her.

## 2014-07-22 NOTE — ED Provider Notes (Signed)
CSN: [id number]     Arrival date & time 07/22/14  2044 History   First MD Initiated Contact with Patient 07/22/14 2335     Chief Complaint  Patient presents with  . Chest Pain  . Neck Pain     (Consider location/radiation/quality/duration/timing/severity/associated sxs/prior Treatment) HPI Comments: Denise Ellis is a 34 y.o. female with a PMHx of COPD, anxiety, crohn's disease, chronic back and leg pain, and DDD, who presents to the ED with multiple complaints, including chest pain that began around 10 AM while she was at rest. She reports the pain is 7/10 pressure/tightness, and her central anterior chest, radiating up both sides of her neck and into both sides of her jaw, constant, worse with movement, and unrelieved with Tums and Pepto-Bismol. She also reports 15 episodes of nonbloody nonbilious emesis and 5 episodes of watery diarrhea prior to arrival, but neither of these symptoms have been ongoing since arrival. She also reports heartburn. Additionally she states she has a headache and feels lightheaded when she stands up intermittently. She goes on to state that she also has numbness in both of her cheeks, although she had not mentioned this previously. She denies any fevers, chills, diaphoresis, shortness of breath, cough, wheezing, leg swelling, recent travel/surgery/immobilization, OCP use, abdominal pain, obstipation, melena, hematochezia, hematemesis, dysuria, hematuria, vaginal bleeding or discharge, rashes, numbness, tingling, weakness, vision changes, sick contacts, suspicious food intake, or recent antibiotic. She states her father and mother both had MI's. She has no personal hx of cardiac disease or PE/DVT. She uses 3L O2 via [place] at home at baseline.  Of note, she is very inconsistent with her answers, has difficulty describing her symptoms, and seems dishonest with answering questions. She endorses every symptom that is asked of her. Also, chart review reveals chronic narcotics  are prescribed to her but she reported not having anything for pain at home. She has a chronic narcotic contract. She was seen at [facility] 3 times in the ER last week for cough/laryngitis, but she initially denied cough.  Patient is a 34 y.o. female presenting with chest pain and neck pain. The history is provided by the patient. No language interpreter was used.  Chest Pain Pain location:  Substernal area Pain quality: pressure and tightness   Pain radiates to:  Neck, L jaw and R jaw Pain radiates to the back: no   Pain severity:  Moderate (7/10) Onset quality:  Gradual Duration:  14 hours Timing:  Constant Progression:  Unchanged Chronicity:  New Context: at rest   Relieved by:  Nothing Worsened by:  Movement Ineffective treatments:  Antacids Associated symptoms: headache, heartburn, nausea, numbness (of cheeks, subjectively) and vomiting   Associated symptoms: no abdominal pain, no altered mental status, no back pain, no claudication, no cough, no diaphoresis, no dizziness, no dysphagia, no fever, no lower extremity edema, no near-syncope, no orthopnea, no PND, no shortness of breath, no syncope and no weakness   Risk factors: obesity   Risk factors: no birth control, no coronary artery disease, no diabetes mellitus, no hypertension, no immobilization, not pregnant, no prior DVT/PE, no smoking and no surgery   Neck Pain Associated symptoms: chest pain, headaches and numbness (of cheeks, subjectively)   Associated symptoms: no fever, no photophobia, no syncope and no weakness     Past Medical History  Diagnosis Date  . Crohn's disease   . Chronic back pain     Do NOT give narcotic RX!  CALLArlee Muslim, [phone], her pain  specialist before giving any additional prescriptions  . COPD (chronic obstructive pulmonary disease)     possible.    . DDD (degenerative disc disease)   . Depression     refused psychiatry, counseling, or any medications other than benzos  . Anxiety    . Chronic leg pain     right leg "I don't have much feeling in it"   Past Surgical History  Procedure Laterality Date  . Appendectomy     Family History  Problem Relation Age of Onset  . Ovarian cancer Mother   . Heart disease Father   . Diabetes Mellitus I Father    History  Substance Use Topics  . Smoking status: Former Smoker -- 0.20 packs/day for 18 years    Types: Cigarettes    Quit date: 01/31/2009  . Smokeless tobacco: Never Used  . Alcohol Use: No   OB History    No data available     Review of Systems  Constitutional: Negative for fever, chills and diaphoresis.  HENT: Negative for trouble swallowing.   Eyes: Negative for photophobia and visual disturbance.  Respiratory: Negative for cough, shortness of breath and wheezing.   Cardiovascular: Positive for chest pain. Negative for orthopnea, claudication, syncope, PND and near-syncope.  Gastrointestinal: Positive for heartburn, nausea, vomiting and diarrhea. Negative for abdominal pain, constipation and blood in stool.  Genitourinary: Negative for dysuria, hematuria, vaginal bleeding and vaginal discharge.  Musculoskeletal: Positive for neck pain. Negative for myalgias, back pain and arthralgias.  Skin: Negative for color change and rash.  Neurological: Positive for light-headedness (with standing intermittently), numbness (of cheeks, subjectively) and headaches. Negative for dizziness, tremors, syncope, facial asymmetry, speech difficulty and weakness.  Psychiatric/Behavioral: Negative for confusion.   10 Systems reviewed and are negative for acute change except as noted in the HPI.    Allergies  Gabapentin; Gadolinium derivatives; and Naproxen  Home Medications   Prior to Admission medications   Medication Sig Start Date End Date Taking? Authorizing Provider  albuterol (PROVENTIL) (2.5 MG/3ML) 0.083% nebulizer solution Take 2.5 mg by nebulization 2 (two) times daily as needed for wheezing or shortness of  breath.   Yes Historical Provider, MD  clonazePAM (KLONOPIN) 2 MG tablet Take 2 mg by mouth 2 (two) times daily. Alternates with valium every month   Yes Historical Provider, MD  diazepam (VALIUM) 5 MG tablet Take 5 mg by mouth 2 (two) times daily. Alternates with klonopin every month   Yes Historical Provider, MD  guaiFENesin-codeine (ROBITUSSIN AC) 100-10 MG/5ML syrup Take 5 mLs by mouth 3 (three) times daily as needed for cough.   Yes Historical Provider, MD  ibuprofen (ADVIL,MOTRIN) 200 MG tablet Take 800 mg by mouth every 8 (eight) hours as needed for moderate pain.    Yes Historical Provider, MD  polyethylene glycol powder (MIRALAX) powder Take 17 g by mouth daily. Patient taking differently: Take 17 g by mouth daily as needed for moderate constipation.  02/02/14  Yes Renae Fickle, MD  Tiotropium Bromide Monohydrate (SPIRIVA RESPIMAT) 2.5 MCG/ACT AERS Inhale 2 puffs into the lungs daily.   Yes Historical Provider, MD  acetaminophen-codeine (TYLENOL #3) 300-30 MG per tablet Take 1 tablet by mouth every 8 (eight) hours as needed for moderate pain. Patient not taking: Reported on 07/22/2014 05/25/14   Erick Colace, MD  hydrocortisone (CORTEF) 10 MG tablet Take one and a half tabs at 6AM and one tab at 1PM every day. Patient not taking: Reported on 06/16/2014 02/02/14   Thea Silversmith  Short, MD  methylPREDNIsolone (MEDROL DOSPACK) 4 MG tablet follow package directions.  Please skip DAY 1 and 2 and start with DAY 3. Patient not taking: Reported on 06/16/2014 02/04/14   Catarina Hartshorn, MD  oxyCODONE-acetaminophen (PERCOCET) 5-325 MG per tablet Take 1 tablet by mouth every 4 (four) hours as needed. Patient not taking: Reported on 07/22/2014 04/03/14   Eber Hong, MD   BP 98/73 mmHg  Pulse 90  Temp(Src) 98.2 F (36.8 C) (Oral)  Resp 17  Ht 5\' 6"  (1.676 m)  Wt 213 lb (96.616 kg)  BMI 34.40 kg/m2  SpO2 100%  LMP 07/08/2014 Physical Exam  Constitutional: She is oriented to person, place, and time.  Vital signs are normal. She appears well-developed and well-nourished.  Non-toxic appearance. No distress.  Afebrile, nontoxic, NAD, watching TV, calm, morbidly obese  HENT:  Head: Normocephalic and atraumatic.  Mouth/Throat: Oropharynx is clear and moist and mucous membranes are normal.  No facial asymmetry No uvula deviation or asymmetric tongue protrusion  Eyes: Conjunctivae and EOM are normal. Pupils are equal, round, and reactive to light. Right eye exhibits no discharge. Left eye exhibits no discharge.  PERRL, EOMI, no nystagmus, no visual field deficits  Neck: Normal range of motion. Neck supple. No JVD present. No spinous process tenderness and no muscular tenderness present. No rigidity. Normal range of motion present.  FROM intact without spinous process or paraspinous muscle TTP, no bony stepoffs or deformities, no muscle spasms. No rigidity or meningeal signs. No JVD  Cardiovascular: Normal rate, regular rhythm, normal heart sounds and intact distal pulses.  Exam reveals no gallop and no friction rub.   No murmur heard. RRR, nl s1/s2, no m/r/g, distal pulses intact, no pedal edema   Pulmonary/Chest: Effort normal and breath sounds normal. No respiratory distress. She has no decreased breath sounds. She has no wheezes. She has no rhonchi. She has no rales. She exhibits tenderness. She exhibits no crepitus, no deformity and no retraction.  CTAB in all lung fields, no w/r/r, no hypoxia or increased WOB, speaking in full sentences, SpO2 100% on home oxygen of 3L via [place] Anterior chest wall TTP without crepitus or deformity  Abdominal: Soft. Normal appearance and bowel sounds are normal. She exhibits no distension. There is no tenderness. There is no rigidity, no rebound, no guarding, no CVA tenderness, no tenderness at McBurney's point and negative Murphy's sign.  Morbidly obese female Soft, NTND, +BS throughout, no r/g/r, neg murphy's, neg mcburney's, no CVA TTP   Musculoskeletal: Normal  range of motion.  MAE x4 Strength and sensation grossly intact Distal pulses intact No pedal edema, neg homan's sign bilaterally  Neurological: She is alert and oriented to person, place, and time. She has normal strength. No cranial nerve deficit or sensory deficit. GCS eye subscore is 4. GCS verbal subscore is 5. GCS motor subscore is 6.  CN 2-12 grossly intact A&O x4 GCS 15 Sensation and strength intact Gait nonataxic Coordination with finger-to-nose WNL  Skin: Skin is warm, dry and intact. No rash noted.  Psychiatric: She has a normal mood and affect.  Nursing note and vitals reviewed.   ED Course  Procedures (including critical care time) Labs Review Labs Reviewed  CBC - Abnormal; Notable for the following:    WBC 16.0 (*)    RDW 16.0 (*)    All other components within normal limits  BASIC METABOLIC PANEL  Rosezena Sensor, ED    Imaging Review Dg Chest 2 View  07/23/2014  CLINICAL DATA:  Acute onset of generalized chest pain and neck pain. Hematemesis. Initial encounter.  EXAM: CHEST  2 VIEW  COMPARISON:  Chest radiograph performed 07/11/2014  FINDINGS: The lungs are well-aerated. Mild peribronchial thickening is noted. There is no evidence of focal opacification, pleural effusion or pneumothorax.  The heart is normal in size; the mediastinal contour is within normal limits. No acute osseous abnormalities are seen.  IMPRESSION: Mild peribronchial thickening noted; lungs otherwise grossly clear.   Electronically Signed   By: Roanna Raider M.D.   On: 07/23/2014 00:23     EKG Interpretation None    EKG: sinus tachycardia, poor quality  MDM   Final diagnoses:  Atypical chest pain  Musculoskeletal chest pain  Chronic pain  Neck pain  Nausea vomiting and diarrhea  Gastroesophageal reflux disease, esophagitis presence not specified  Chronic pain associated with significant psychosocial dysfunction    34 y.o. female here with CP x12hrs radiating into neck and face,  no SOB. Multiple complaints on review of systems including n/v/d, but inconsistent answers when asked to explain her symptoms and no ongoing symptoms during her ED stay since arrival approx 3hrs prior to exam, therefore I suspect a degree of dishonesty. It is documented in the chart that she has a pain contract, although she tells me she doesn't have anything at home for pain. Seen here a month ago for similar complaints, complete work up revealing no etiology. Has been seen [facility] times in the ER at [facility] last week, all for cough, diagnosed with laryngitis and given viscous lidocaine with relief. On exam, chest pain is reproducible, neurologic exam completely benign, no focal deficits. No tachycardia, no increased oxygen requirement, no pedal edema or homan's sign, doubt DVT/PE. Trop neg at 11hrs after onset, and EKG poor quality and with movement artifact but relatively unchanged from prior EKGs, therefore doubt ACS. Doubt dissection. Will give viscous lidocaine since pt is allergic to naprosyn and reports she can't take toradol due to this allergy. CBC with mild leukocytosis which I believe is somewhat due to hemoconcentration since her H/H is up from prior baselines. BMP unremarkable. Nonsmoker with no cardiac disease in herself, low cardiac RFs, reports her father and mother had cardiac disease but pt is inconsistent with these answers. CXR pending but doubt need for admission based on exam/work up thus far. Will give GI cocktail now and PO challenge then likely discharge with instructions to f/up with cardiology for her chronic chest pain.  12:35 AM CXR with mild peribronchial thickening, no acute changes. Tolerating PO well and somewhat improved after GI cocktail. Will send home with magic mouthwash, and discussed tylenol/motrin as needed. Will have her f/up with PCP in 3 days for her ongoing medical issues, and in 1wk with cardiology. I explained the diagnosis and have given explicit precautions to return to the  ER including for any other new or worsening symptoms. The patient understands and accepts the medical plan as it's been dictated and I have answered their questions. Discharge instructions concerning home care and prescriptions have been given. The patient is STABLE and is discharged to home in good condition.  BP 98/73 mmHg  Pulse 90  Temp(Src) 98.2 F (36.8 C) (Oral)  Resp 17  Ht 5\' 6"  (1.676 m)  Wt 213 lb (96.616 kg)  BMI 34.40 kg/m2  SpO2 100%  LMP 07/08/2014  Meds ordered this encounter  Medications  . gi cocktail (Maalox,Lidocaine,Donnatal)    Sig:   . Alum & Mag Hydroxide-Simeth (  MAGIC MOUTHWASH W/LIDOCAINE) SOLN    Sig: Take 5 mLs by mouth 3 (three) times daily as needed. Swish and swallow 5mL PO TID PRN indigestion pain    Dispense:  30 mL    Refill:  0    Order Specific Question:  Supervising Provider    Answer:  Eber Hong [[id number]]     Denise Fragoso Camprubi-Soms, PA-C 07/23/14 0037  Purvis Sheffield, MD 07/24/14 1142

## 2014-07-23 MED ORDER — MAGIC MOUTHWASH W/LIDOCAINE: 5.0000 mL | Freq: Three times a day (TID) | ORAL | Status: DC | PRN

## 2014-07-23 NOTE — Discharge Instructions (Signed)
Your chest pain has been evaluated and no emergent cause was found. It's likely from your previous cough and straining the muscles in your chest wall. Use tylenol or motrin as needed for pain. Use magic mouthwash as directed for heartburn/indigestion/pain. See your regular doctor in 3 days and follow up with the cardiologist in 1 week for ongoing management of your chronic medical complaints. Stay well hydrated with small sips of fluids throughout the day. Follow a BRAT (banana-rice-applesauce-toast) diet as described below for the next 24-48 hours. The 'BRAT' diet is suggested, then progress to diet as tolerated as symptoms abate. Call if bloody stools, persistent diarrhea, vomiting, fever or abdominal pain. Return to ER for changing or worsening of symptoms.  Food Choices to Help Relieve Diarrhea When you have diarrhea, the foods you eat and your eating habits are very important. Choosing the right foods and drinks can help relieve diarrhea. Also, because diarrhea can last up to 7 days, you need to replace lost fluids and electrolytes (such as sodium, potassium, and chloride) in order to help prevent dehydration.  WHAT GENERAL GUIDELINES DO I NEED TO FOLLOW?  Slowly drink 1 cup (8 oz) of fluid for each episode of diarrhea. If you are getting enough fluid, your urine will be clear or pale yellow.  Eat starchy foods. Some good choices include white rice, white toast, pasta, low-fiber cereal, baked potatoes (without the skin), saltine crackers, and bagels.  Avoid large servings of any cooked vegetables.  Limit fruit to two servings per day. A serving is  cup or 1 small piece.  Choose foods with less than 2 g of fiber per serving.  Limit fats to less than 8 tsp (38 g) per day.  Avoid fried foods.  Eat foods that have probiotics in them. Probiotics can be found in certain dairy products.  Avoid foods and beverages that may increase the speed at which food moves through the stomach and intestines  (gastrointestinal tract). Things to avoid include:  High-fiber foods, such as dried fruit, raw fruits and vegetables, nuts, seeds, and whole grain foods.  Spicy foods and high-fat foods.  Foods and beverages sweetened with high-fructose corn syrup, honey, or sugar alcohols such as xylitol, sorbitol, and mannitol. WHAT FOODS ARE RECOMMENDED? Grains White rice. White, [place], or pita breads (fresh or toasted), including plain rolls, buns, or bagels. White pasta. Saltine, soda, or graham crackers. Pretzels. Low-fiber cereal. Cooked cereals made with water (such as cornmeal, farina, or cream cereals). Plain muffins. Matzo. Melba toast. Zwieback.  Vegetables Potatoes (without the skin). Strained tomato and vegetable juices. Most well-cooked and canned vegetables without seeds. Tender lettuce. Fruits Cooked or canned applesauce, apricots, cherries, fruit cocktail, grapefruit, peaches, pears, or plums. Fresh bananas, apples without skin, cherries, grapes, cantaloupe, grapefruit, peaches, oranges, or plums.  Meat and Other Protein Products Baked or boiled chicken. Eggs. Tofu. Fish. Seafood. Smooth peanut butter. Ground or well-cooked tender beef, ham, veal, lamb, pork, or poultry.  Dairy Plain yogurt, kefir, and unsweetened liquid yogurt. Lactose-free milk, buttermilk, or soy milk. Plain hard cheese. Beverages Sport drinks. Clear broths. Diluted fruit juices (except prune). Regular, caffeine-free sodas such as ginger ale. Water. Decaffeinated teas. Oral rehydration solutions. Sugar-free beverages not sweetened with sugar alcohols. Other Bouillon, broth, or soups made from recommended foods.  The items listed above may not be a complete list of recommended foods or beverages. Contact your dietitian for more options. WHAT FOODS ARE NOT RECOMMENDED? Grains Whole grain, whole wheat, bran, or rye breads, rolls,  pastas, crackers, and cereals. Wild or brown rice. Cereals that contain more than 2 g of fiber  per serving. Corn tortillas or taco shells. Cooked or dry oatmeal. Granola. Popcorn. Vegetables Raw vegetables. Cabbage, broccoli, Brussels sprouts, artichokes, baked beans, beet greens, corn, kale, legumes, peas, sweet potatoes, and yams. Potato skins. Cooked spinach and cabbage. Fruits Dried fruit, including raisins and dates. Raw fruits. Stewed or dried prunes. Fresh apples with skin, apricots, mangoes, pears, raspberries, and strawberries.  Meat and Other Protein Products Chunky peanut butter. Nuts and seeds. Beans and lentils. Tomasa Blase.  Dairy High-fat cheeses. Milk, chocolate milk, and beverages made with milk, such as milk shakes. Cream. Ice cream. Sweets and Desserts Sweet rolls, doughnuts, and sweet breads. Pancakes and waffles. Fats and Oils Butter. Cream sauces. Margarine. Salad oils. Plain salad dressings. Olives. Avocados.  Beverages Caffeinated beverages (such as coffee, tea, soda, or energy drinks). Alcoholic beverages. Fruit juices with pulp. Prune juice. Soft drinks sweetened with high-fructose corn syrup or sugar alcohols. Other Coconut. Hot sauce. Chili powder. Mayonnaise. Gravy. Cream-based or milk-based soups.  The items listed above may not be a complete list of foods and beverages to avoid. Contact your dietitian for more information. WHAT SHOULD I DO IF I BECOME DEHYDRATED? Diarrhea can sometimes lead to dehydration. Signs of dehydration include dark urine and dry mouth and skin. If you think you are dehydrated, you should rehydrate with an oral rehydration solution. These solutions can be purchased at pharmacies, retail stores, or online.  Drink -1 cup (120-240 mL) of oral rehydration solution each time you have an episode of diarrhea. If drinking this amount makes your diarrhea worse, try drinking smaller amounts more often. For example, drink 1-3 tsp (5-15 mL) every 5-10 minutes.  A general rule for staying hydrated is to drink 1-2 L of fluid per day. Talk to your  health care provider about the specific amount you should be drinking each day. Drink enough fluids to keep your urine clear or pale yellow. Document Released: 06/30/2003 Document Revised: 04/14/2013 Document Reviewed: 03/02/2013 [facility] Patient Information 2015 [place], [place]. This information is not intended to replace advice given to you by your health care provider. Make sure you discuss any questions you have with your health care provider.   Chest Wall Pain Chest wall pain is pain in or around the bones and muscles of your chest. It may take up to 6 weeks to get better. It may take longer if you must stay physically active in your work and activities.  CAUSES  Chest wall pain may happen on its own. However, it may be caused by:  A viral illness like the flu.  Injury.  Coughing.  Exercise.  Arthritis.  Fibromyalgia.  Shingles. HOME CARE INSTRUCTIONS   Avoid overtiring physical activity. Try not to strain or perform activities that cause pain. This includes any activities using your chest or your abdominal and side muscles, especially if heavy weights are used.  Put ice on the sore area.  Put ice in a plastic bag.  Place a towel between your skin and the bag.  Leave the ice on for 15-20 minutes per hour while awake for the first 2 days.  Only take over-the-counter or prescription medicines for pain, discomfort, or fever as directed by your caregiver. SEEK IMMEDIATE MEDICAL CARE IF:   Your pain increases, or you are very uncomfortable.  You have a fever.  Your chest pain becomes worse.  You have new, unexplained symptoms.  You have nausea or  vomiting.  You feel sweaty or lightheaded.  You have a cough with phlegm (sputum), or you cough up blood. MAKE SURE YOU:   Understand these instructions.  Will watch your condition.  Will get help right away if you are not doing well or get worse. Document Released: 04/09/2005 Document Revised: 07/02/2011 Document  Reviewed: 12/04/2010 [facility] Patient Information 2015 [place], [place]. This information is not intended to replace advice given to you by your health care provider. Make sure you discuss any questions you have with your health care provider.  Esophagitis Esophagitis is inflammation of the esophagus. It can involve swelling, soreness, and pain in the esophagus. This condition can make it difficult and painful to swallow. CAUSES  Most causes of esophagitis are not serious. Many different factors can cause esophagitis, including:  Gastroesophageal reflux disease (GERD). This is when acid from your stomach flows up into the esophagus.  Recurrent vomiting.  An allergic-type reaction.  Certain medicines, especially those that come in large pills.  Ingestion of harmful chemicals, such as household cleaning products.  Heavy alcohol use.  An infection of the esophagus.  Radiation treatment for cancer.  Certain diseases such as sarcoidosis, Crohn's disease, and scleroderma. These diseases may cause recurrent esophagitis. SYMPTOMS   Trouble swallowing.  Painful swallowing.  Chest pain.  Difficulty breathing.  Nausea.  Vomiting.  Abdominal pain. DIAGNOSIS  Your caregiver will take your history and do a physical exam. Depending upon what your caregiver finds, certain tests may also be done, including:  Barium X-ray. You will drink a solution that coats the esophagus, and X-rays will be taken.  Endoscopy. A lighted tube is put down the esophagus so your caregiver can examine the area.  Allergy tests. These can sometimes be arranged through follow-up visits. TREATMENT  Treatment will depend on the cause of your esophagitis. In some cases, steroids or other medicines may be given to help relieve your symptoms or to treat the underlying cause of your condition. Medicines that may be recommended include:  Viscous lidocaine, to soothe the esophagus.  Antacids.  Acid  reducers.  Proton pump inhibitors.  Antiviral medicines for certain viral infections of the esophagus.  Antifungal medicines for certain fungal infections of the esophagus.  Antibiotic medicines, depending on the cause of the esophagitis. HOME CARE INSTRUCTIONS   Avoid foods and drinks that seem to make your symptoms worse.  Eat small, frequent meals instead of large meals.  Avoid eating for the 3 hours prior to your bedtime.  If you have trouble taking pills, use a pill splitter to decrease the size and likelihood of the pill getting stuck or injuring the esophagus on the way down. Drinking water after taking a pill also helps.  Stop smoking if you smoke.  Maintain a healthy weight.  Wear loose-fitting clothing. Do not wear anything tight around your waist that causes pressure on your stomach.  Raise the head of your bed 6 to 8 inches with wood blocks to help you sleep. Extra pillows will not help.  Only take over-the-counter or prescription medicines as directed by your caregiver. SEEK IMMEDIATE MEDICAL CARE IF:  You have severe chest pain that radiates into your arm, neck, or jaw.  You feel sweaty, dizzy, or lightheaded.  You have shortness of breath.  You vomit blood.  You have difficulty or pain with swallowing.  You have bloody or black, tarry stools.  You have a fever.  You have a burning sensation in the chest more than 3 times  a week for more than 2 weeks.  You cannot swallow, drink, or eat.  You drool because you cannot swallow your saliva. MAKE SURE YOU:  Understand these instructions.  Will watch your condition.  Will get help right away if you are not doing well or get worse. Document Released: 05/17/2004 Document Revised: 07/02/2011 Document Reviewed: 12/08/2010 [facility] Patient Information 2015 [place], [place]. This information is not intended to replace advice given to you by your health care provider. Make sure you discuss any questions you  have with your health care provider.  Food Choices for Gastroesophageal Reflux Disease When you have gastroesophageal reflux disease (GERD), the foods you eat and your eating habits are very important. Choosing the right foods can help ease the discomfort of GERD. WHAT GENERAL GUIDELINES DO I NEED TO FOLLOW?  Choose fruits, vegetables, whole grains, low-fat dairy products, and low-fat meat, fish, and poultry.  Limit fats such as oils, salad dressings, butter, nuts, and avocado.  Keep a food diary to identify foods that cause symptoms.  Avoid foods that cause reflux. These may be different for different people.  Eat frequent small meals instead of three large meals each day.  Eat your meals slowly, in a relaxed setting.  Limit fried foods.  Cook foods using methods other than frying.  Avoid drinking alcohol.  Avoid drinking large amounts of liquids with your meals.  Avoid bending over or lying down until 2-3 hours after eating. WHAT FOODS ARE NOT RECOMMENDED? The following are some foods and drinks that may worsen your symptoms: Vegetables Tomatoes. Tomato juice. Tomato and spaghetti sauce. Chili peppers. Onion and garlic. Horseradish. Fruits Oranges, grapefruit, and lemon (fruit and juice). Meats High-fat meats, fish, and poultry. This includes hot dogs, ribs, ham, sausage, salami, and bacon. Dairy Whole milk and chocolate milk. Sour cream. Cream. Butter. Ice cream. Cream cheese.  Beverages Coffee and tea, with or without caffeine. Carbonated beverages or energy drinks. Condiments Hot sauce. Barbecue sauce.  Sweets/Desserts Chocolate and cocoa. Donuts. Peppermint and spearmint. Fats and Oils High-fat foods, including [place] fries and potato chips. Other Vinegar. Strong spices, such as black pepper, white pepper, red pepper, cayenne, curry powder, cloves, ginger, and chili powder. The items listed above may not be a complete list of foods and beverages to avoid. Contact  your dietitian for more information. Document Released: 04/09/2005 Document Revised: 04/14/2013 Document Reviewed: 02/11/2013 [facility] Patient Information 2015 [place], [place]. This information is not intended to replace advice given to you by your health care provider. Make sure you discuss any questions you have with your health care provider.

## 2014-07-26 ENCOUNTER — Other Ambulatory Visit: Payer: Self-pay | Admitting: *Deleted

## 2014-07-26 ENCOUNTER — Encounter: Payer: Medicare Other | Admitting: Registered Nurse

## 2014-07-27 ENCOUNTER — Encounter: Payer: Medicare Other | Attending: Physical Medicine & Rehabilitation | Admitting: Registered Nurse

## 2014-07-27 ENCOUNTER — Encounter: Payer: Self-pay | Admitting: Registered Nurse

## 2014-07-27 VITALS — BP 118/70 | HR 104 | Resp 16

## 2014-07-27 DIAGNOSIS — Z5181 Encounter for therapeutic drug level monitoring: Secondary | ICD-10-CM | POA: Diagnosis not present

## 2014-07-27 DIAGNOSIS — F32A Depression, unspecified: Secondary | ICD-10-CM

## 2014-07-27 DIAGNOSIS — Z79899 Other long term (current) drug therapy: Secondary | ICD-10-CM | POA: Diagnosis not present

## 2014-07-27 DIAGNOSIS — M549 Dorsalgia, unspecified: Secondary | ICD-10-CM | POA: Insufficient documentation

## 2014-07-27 DIAGNOSIS — G8929 Other chronic pain: Secondary | ICD-10-CM | POA: Diagnosis not present

## 2014-07-27 DIAGNOSIS — M5416 Radiculopathy, lumbar region: Secondary | ICD-10-CM | POA: Diagnosis not present

## 2014-07-27 DIAGNOSIS — M5136 Other intervertebral disc degeneration, lumbar region: Secondary | ICD-10-CM | POA: Insufficient documentation

## 2014-07-27 DIAGNOSIS — F329 Major depressive disorder, single episode, unspecified: Secondary | ICD-10-CM

## 2014-07-27 MED ORDER — ACETAMINOPHEN-CODEINE #3 300-30 MG PO TABS: 1.0000 | ORAL_TABLET | Freq: Three times a day (TID) | ORAL | Status: DC | PRN

## 2014-07-27 NOTE — Progress Notes (Signed)
Subjective:    Patient ID: Denise Ellis, female    DOB: [birth year], 34 y.o.   MRN: [id number]  HPI: Denise Ellis is a 34 year old female who returns for follow up for chronic pain and medication refill. She says her pain is located in her lower back radiating into bilateral lower extremities. It radiates into her entire right leg and on the left from groin to thigh. Her current exercise regime is walking. She was diagnosed with pneumonia twice she was given Chertussin.  Today she was educated on the narcotic policy she verbalizes understanding. She [facility] ED on 3/31 for chest pain and neck pain.  Also stated the nurse practitioner she seen at [facility] [phone]) told her she had too much tylenol in her blood. I called and spoke to Ms. Denise Ellis she sated " she never told Denise Ellis this. She will call Denise Ellis to to clarify her misunderstanding.  Pain Inventory Average Pain 7 Pain Right Now 8 My pain is constant, sharp, stabbing and aching  In the last 24 hours, has pain interfered with the following? General activity 2 Relation with others 2 Enjoyment of life 2 What TIME of day is your pain at its worst? evening Sleep (in general) Poor  Pain is worse with: walking, bending, sitting and standing Pain improves with: medication Relief from Meds: no answer  Mobility walk without assistance walk with assistance use a cane how many minutes can you walk? 10 ability to climb steps?  yes do you drive?  no  Function disabled: date disabled .  Neuro/Psych weakness numbness tingling trouble walking spasms anxiety  Prior Studies Any changes since last visit?  no  Physicians involved in your care Any changes since last visit?  no Primary care Denise wells [facility]   Family History  Problem Relation Age of Onset  . Ovarian cancer Mother   . Heart disease Father   . Diabetes Mellitus I Father    History   Social  History  . Marital Status: Married    Spouse Name: N/A  . Number of Children: N/A  . Years of Education: N/A   Social History Main Topics  . Smoking status: Former Smoker -- 0.20 packs/day for 18 years    Types: Cigarettes    Quit date: 01/31/2009  . Smokeless tobacco: Never Used  . Alcohol Use: No  . Drug Use: Not on file  . Sexual Activity: No   Other Topics Concern  . None   Social History Narrative   ** Merged History Encounter **       Past Surgical History  Procedure Laterality Date  . Appendectomy     Past Medical History  Diagnosis Date  . Crohn's disease   . Chronic back pain     Do NOT give narcotic RX!  CALLArlee Muslim, [phone], her pain specialist before giving any additional prescriptions  . COPD (chronic obstructive pulmonary disease)     possible.    . DDD (degenerative disc disease)   . Depression     refused psychiatry, counseling, or any medications other than benzos  . Anxiety   . Chronic leg pain     right leg "I don't have much feeling in it"   BP 118/70 mmHg  Pulse 104  Resp 16  SpO2 96%  LMP 07/08/2014  Opioid Risk Score:   Fall Risk Score: Low Fall Risk (0-5 points) (previousl educated and  given handout)`1  Depression screen PHQ 2/9  Depression screen PHQ 2/9 07/27/2014  Decreased Interest 1  Down, Depressed, Hopeless 2  PHQ - 2 Score 3  Altered sleeping 2  Tired, decreased energy 2  Change in appetite 2  Feeling bad or failure about yourself  1  Trouble concentrating 0  Moving slowly or fidgety/restless 0  Suicidal thoughts 0  PHQ-9 Score 10     Review of Systems  Constitutional: Positive for appetite change.  Respiratory: Positive for cough, shortness of breath and wheezing.   Gastrointestinal: Positive for nausea, vomiting, abdominal pain and diarrhea.  Musculoskeletal: Positive for gait problem.       Spasms  Neurological: Positive for weakness and numbness.       Tingling  Psychiatric/Behavioral: The  patient is nervous/anxious.   All other systems reviewed and are negative.      Objective:   Physical Exam  Constitutional: She is oriented to person, place, and time. She appears well-developed and well-nourished.  HENT:  Head: Normocephalic and atraumatic.  Neck: Normal range of motion. Neck supple.  Cardiovascular: Normal rate and regular rhythm.   Pulmonary/Chest: Effort normal and breath sounds normal.  Musculoskeletal:  Normal Muscle Bulk and Muscle Testing Reveals: Upper Extremities: Full ROM and Muscle Strength Right 4/5 and Left 5/5  Lumbar Paraspinal Tenderness: L-3- L-5  Lower Extremities: Full ROM and Muscle Strength 5/5 Arises from chair with ease Narrow Based Gait  Neurological: She is alert and oriented to person, place, and time.  Skin: Skin is warm and dry.  Psychiatric: She has a normal mood and affect.  Nursing note and vitals reviewed.         Assessment & Plan:  1.Chronic low back pain, CT evidence of degenerative disc, With some potential impingement at L5-S1.  Refilled: Tylenol #3 300/30 mg one tablet every 8 hours #90. Continue with Exercise and increase activity as tolerated. 2. Radiculopathy: Continue Gabapentin  30 minutes of face to face patient care time was spent during this visit. All questions were encouraged and answered.  F/U 3 months

## 2014-07-28 ENCOUNTER — Other Ambulatory Visit: Payer: Self-pay | Admitting: Registered Nurse

## 2014-07-29 LAB — PMP ALCOHOL METABOLITE (ETG): Ethyl Glucuronide (EtG): NEGATIVE ng/mL

## 2014-08-01 LAB — BENZODIAZEPINES (GC/LC/MS), URINE
Alprazolam metabolite (GC/LC/MS), ur confirm: NEGATIVE ng/mL (ref <25)
Clonazepam metabolite (GC/LC/MS), ur confirm: 458 ng/mL — AB (ref <25)
Flurazepam metabolite (GC/LC/MS), ur confirm: NEGATIVE ng/mL (ref <50)
Lorazepam (GC/LC/MS), ur confirm: NEGATIVE ng/mL (ref <50)
Midazolam (GC/LC/MS), ur confirm: NEGATIVE ng/mL (ref <50)
NORDIAZEPAMU: 1417 ng/mL — AB (ref <50)
OXAZEPAMU: 3121 ng/mL — AB (ref <50)
Temazepam (GC/LC/MS), ur confirm: 1927 ng/mL — AB (ref <50)
Triazolam metabolite (GC/LC/MS), ur confirm: NEGATIVE ng/mL (ref <50)

## 2014-08-01 LAB — OXYCODONE, URINE (LC/MS-MS)
NOROXYCODONE, UR: 277 ng/mL — AB (ref <50)
OXYCODONE, UR: NEGATIVE ng/mL (ref <50)
OXYMORPHONE, URINE: 96 ng/mL — AB (ref <50)

## 2014-08-03 LAB — PRESCRIPTION MONITORING PROFILE (SOLSTAS)
Amphetamine/Meth: NEGATIVE ng/mL
BARBITURATE SCREEN, URINE: NEGATIVE ng/mL
Buprenorphine, Urine: NEGATIVE ng/mL
CREATININE, URINE: 157.26 mg/dL (ref >20.0)
Cannabinoid Scrn, Ur: NEGATIVE ng/mL
Carisoprodol, Urine: NEGATIVE ng/mL
Cocaine Metabolites: NEGATIVE ng/mL
Fentanyl, Ur: NEGATIVE ng/mL
MDMA URINE: NEGATIVE ng/mL
Meperidine, Ur: NEGATIVE ng/mL
Methadone Screen, Urine: NEGATIVE ng/mL
NITRITES URINE, INITIAL: NEGATIVE ug/mL
OPIATE SCREEN, URINE: NEGATIVE ng/mL
Propoxyphene: NEGATIVE ng/mL
TAPENTADOLUR: NEGATIVE ng/mL
TRAMADOL UR: NEGATIVE ng/mL
Zolpidem, Urine: NEGATIVE ng/mL
pH, Initial: 6.5 pH (ref 4.5–8.9)

## 2014-08-11 NOTE — Progress Notes (Addendum)
Urine drug screen for this encounter is inconsistent. She reported no meds since 07/23/14 (test 07/27/14)  Positive for diazepam/clonazepam metabolite which is prescribed, but it is positive for oxymorphone and noroxycodone --metabolites of oxydodone.  We have been prescribing tylenol #3 plus she has been receiving cheratussin for bronchitis but it has codeine and we should see codeine and morphine metabolites for both if taken.within a few days so that matches with the "no meds since 07/23/14

## 2014-08-13 ENCOUNTER — Telehealth: Payer: Self-pay | Admitting: *Deleted

## 2014-08-13 NOTE — Telephone Encounter (Signed)
Left voicemail for Denise Ellis to return our call.  She is being discharged from our clinic due to non prescribed medication in her urine drug screen. She also did not bring medication bottle to appointment as required. A letter will be mailed to Denise Ellis and a list of surrounding pain clinics.

## 2014-08-13 NOTE — Telephone Encounter (Signed)
The previous number she had listed as a mobile number that I called and left message to call office: gentleman called me back and said that he was the owner of the number and it was not [facility] and she ha been using his number for at least 5 years.  He has gotten calls from doctors offices and various other entities looking for her over the years and he is quite angry and would like [place] to remove his number from our records.  I told him I would do that for him and I did remover it from her list of numbers.  The home number that I had called first and was told I had the wrong number.  Being it was the only number we had I called it back after speaking with the un identified gentleman, and A[place] answered.  I explained to her that her urine drug screen had come back with un[rescribed medication in it.  She asked what it ws and I told her oxycodone.  She says she has been prescribed percocet. I told her that once she is under Dr Wynn Banker care and being prescribed narcotics she cannot take other medications.  She says she was never told that.  I explained that it was in her contract and she says she did not have a contract until this last visit.   I told her that she has been prescribed medication by Dr Wynn Banker prior to the 07/26/14 visit and by the [facility] she had not been prescribed percocet since December.  Dr Wynn Banker started prescribing in February.  She argued that she had a valid point that she had a legal prescription for percocet. I explained that It was Dr Wynn Banker decision and that he has decided to discharge her.  She continued to argue that she had a valid point and she did not have a contract and was never told she could not take other medication. (In fact I had this conversation with Ms Marmion over a prescription for narcotic cough medication she received from an ER visit and advised her that she could not take a narcotic prescription from another MD unless she had permission to do so for which she  replied she had already filled it.)  I told Ms Whetzel that I was not going to debate the issue with her that I would discuss with Dr Wynn Banker and we would let her know the decision. She continued to insist she had a valid point and that she had done nothing wrong.  I told her I would not discuss any further until I spoke with Dr Wynn Banker and excused myself from the conversation.

## 2014-08-13 NOTE — Telephone Encounter (Addendum)
I spoke with Dr Wynn Banker and he is not going to change his mind on the discharge.

## 2014-08-16 ENCOUNTER — Encounter: Payer: Self-pay | Admitting: Physical Medicine & Rehabilitation

## 2014-08-19 ENCOUNTER — Telehealth: Payer: Self-pay | Admitting: *Deleted

## 2014-08-19 NOTE — Telephone Encounter (Signed)
Letter of discharge written and mailed.  Appointments cancelled.

## 2014-08-23 ENCOUNTER — Ambulatory Visit: Payer: Medicare Other | Attending: Psychology | Admitting: Psychology

## 2014-09-05 DIAGNOSIS — G8929 Other chronic pain: Secondary | ICD-10-CM | POA: Insufficient documentation

## 2014-09-14 ENCOUNTER — Encounter: Payer: Self-pay | Admitting: *Deleted

## 2014-09-14 NOTE — Progress Notes (Signed)
Received discharge letter back from [employer] marked undeliverable.  It was sent certified mail and they could not deliver.Marland Kitchen

## 2014-10-22 ENCOUNTER — Ambulatory Visit: Payer: Medicare Other | Admitting: Physical Medicine & Rehabilitation

## 2014-11-13 ENCOUNTER — Emergency Department (HOSPITAL_COMMUNITY): Payer: Medicare Other

## 2014-11-13 ENCOUNTER — Emergency Department (HOSPITAL_COMMUNITY)
Admission: EM | Admit: 2014-11-13 | Discharge: 2014-11-14 | Disposition: A | Discharge status: Home / Self Care | Payer: Medicare Other | Attending: Emergency Medicine | Admitting: Emergency Medicine

## 2014-11-13 ENCOUNTER — Encounter (HOSPITAL_COMMUNITY): Payer: Self-pay | Admitting: Emergency Medicine

## 2014-11-13 DIAGNOSIS — J449 Chronic obstructive pulmonary disease, unspecified: Secondary | ICD-10-CM | POA: Insufficient documentation

## 2014-11-13 DIAGNOSIS — Z3202 Encounter for pregnancy test, result negative: Secondary | ICD-10-CM | POA: Diagnosis not present

## 2014-11-13 DIAGNOSIS — F419 Anxiety disorder, unspecified: Secondary | ICD-10-CM | POA: Insufficient documentation

## 2014-11-13 DIAGNOSIS — Z87891 Personal history of nicotine dependence: Secondary | ICD-10-CM | POA: Diagnosis not present

## 2014-11-13 DIAGNOSIS — Z79899 Other long term (current) drug therapy: Secondary | ICD-10-CM | POA: Insufficient documentation

## 2014-11-13 DIAGNOSIS — R079 Chest pain, unspecified: Secondary | ICD-10-CM | POA: Diagnosis not present

## 2014-11-13 DIAGNOSIS — Z8673 Personal history of transient ischemic attack (TIA), and cerebral infarction without residual deficits: Secondary | ICD-10-CM | POA: Insufficient documentation

## 2014-11-13 DIAGNOSIS — F329 Major depressive disorder, single episode, unspecified: Secondary | ICD-10-CM | POA: Diagnosis not present

## 2014-11-13 DIAGNOSIS — G8929 Other chronic pain: Secondary | ICD-10-CM | POA: Diagnosis not present

## 2014-11-13 DIAGNOSIS — Z7952 Long term (current) use of systemic steroids: Secondary | ICD-10-CM | POA: Diagnosis not present

## 2014-11-13 HISTORY — DX: Cerebral infarction, unspecified: I63.9

## 2014-11-13 LAB — COMPREHENSIVE METABOLIC PANEL
ALK PHOS: 98 U/L (ref 38–126)
ALT: 16 U/L (ref 14–54)
AST: 17 U/L (ref 15–41)
Albumin: 3.6 g/dL (ref 3.5–5.0)
Anion gap: 12 (ref 5–15)
BUN: 9 mg/dL (ref 6–20)
CO2: 20 mmol/L — AB (ref 22–32)
CREATININE: 0.82 mg/dL (ref 0.44–1.00)
Calcium: 8.8 mg/dL — ABNORMAL LOW (ref 8.9–10.3)
Chloride: 105 mmol/L (ref 101–111)
GFR calc non Af Amer: >60 mL/min (ref >60)
Glucose, Bld: 101 mg/dL — ABNORMAL HIGH (ref 65–99)
POTASSIUM: 3.8 mmol/L (ref 3.5–5.1)
Sodium: 137 mmol/L (ref 135–145)
Total Bilirubin: <0.1 mg/dL — ABNORMAL LOW (ref 0.3–1.2)
Total Protein: 7.9 g/dL (ref 6.5–8.1)

## 2014-11-13 LAB — CBC WITH DIFFERENTIAL/PLATELET
Basophils Absolute: 0 10*3/uL (ref 0.0–0.1)
Basophils Relative: 0 % (ref 0–1)
Eosinophils Absolute: 0.2 10*3/uL (ref 0.0–0.7)
Eosinophils Relative: 2 % (ref 0–5)
HCT: 41.5 % (ref 36.0–46.0)
Hemoglobin: 13 g/dL (ref 12.0–15.0)
Lymphocytes Relative: 33 % (ref 12–46)
Lymphs Abs: 3.7 10*3/uL (ref 0.7–4.0)
MCH: 26.4 pg (ref 26.0–34.0)
MCHC: 31.3 g/dL (ref 30.0–36.0)
MCV: 84.3 fL (ref 78.0–100.0)
MONO ABS: 0.9 10*3/uL (ref 0.1–1.0)
MONOS PCT: 8 % (ref 3–12)
Neutro Abs: 6.5 10*3/uL (ref 1.7–7.7)
Neutrophils Relative %: 57 % (ref 43–77)
Platelets: 480 10*3/uL — ABNORMAL HIGH (ref 150–400)
RBC: 4.92 MIL/uL (ref 3.87–5.11)
RDW: 14.9 % (ref 11.5–15.5)
WBC: 11.3 10*3/uL — ABNORMAL HIGH (ref 4.0–10.5)

## 2014-11-13 LAB — D-DIMER, QUANTITATIVE: D-Dimer, Quant: 0.55 ug/mL-FEU — ABNORMAL HIGH (ref 0.00–0.48)

## 2014-11-13 LAB — POC URINE PREG, ED: PREG TEST UR: NEGATIVE

## 2014-11-13 LAB — LIPASE, BLOOD: Lipase: 18 U/L — ABNORMAL LOW (ref 22–51)

## 2014-11-13 MED ORDER — IOHEXOL 350 MG/ML SOLN
100.0000 mL | Freq: Once | INTRAVENOUS | Status: AC | PRN
  Administered 2014-11-13: 100 mL via INTRAVENOUS

## 2014-11-13 MED ORDER — HYDROMORPHONE HCL 1 MG/ML IJ SOLN
1.0000 mg | Freq: Once | INTRAMUSCULAR | Status: AC
  Administered 2014-11-13: 1 mg via INTRAVENOUS
  Filled 2014-11-13: qty 1

## 2014-11-13 MED ORDER — ONDANSETRON 4 MG PO TBDP
4.0000 mg | ORAL_TABLET | Freq: Once | ORAL | Status: AC
  Administered 2014-11-13: 4 mg via ORAL
  Filled 2014-11-13: qty 1

## 2014-11-13 MED ORDER — METHYLPREDNISOLONE SODIUM SUCC 125 MG IJ SOLR
125.0000 mg | Freq: Once | INTRAMUSCULAR | Status: AC
  Administered 2014-11-13: 125 mg via INTRAVENOUS
  Filled 2014-11-13: qty 2

## 2014-11-13 MED ORDER — SODIUM CHLORIDE 0.9 % IV BOLUS (SEPSIS)
1000.0000 mL | Freq: Once | INTRAVENOUS | Status: AC
  Administered 2014-11-13: 1000 mL via INTRAVENOUS

## 2014-11-13 NOTE — ED Notes (Signed)
Per EMS, pt started having chest pain around 0900 this AM. Pt states that she has pressure in the center of her chest radiating to her left arm. Pt states that she occasionally feels ike something is squezzing her heart. Pt also c/o headache, SOB, weakness and dizziness. Pt received 324 ASA en route, and 1 NTG with no relief. Pt tachycardic with heart rate of 130. Pt with hx of anxiety, stroke and chronic back pain.

## 2014-11-13 NOTE — ED Notes (Signed)
Pt reporting numbness in her left jaw and the tops of her feet.

## 2014-11-13 NOTE — ED Notes (Signed)
Rees,MD at bedside.  

## 2014-11-13 NOTE — ED Provider Notes (Signed)
CSN: [id number]     Arrival date & time 11/13/14  2031 History   First MD Initiated Contact with Patient 11/13/14 2114     Chief Complaint  Patient presents with  . Chest Pain     Patient is a 34 y.o. female presenting with chest pain. The history is provided by the patient. No language interpreter was used.  Chest Pain  Denise Ellis presents for evaluation of chest pain. She states that around 9:00 am she was outside she developed central and right-sided squeezing chest pain. She has associated shortness of breath, nausea, vomiting. The pain radiates to her left arm and her left face. She has a history of anxiety, stroke, chronic back pain. Symptoms are severe and constant. She has a family history of cardiac disease, her father had an MI at the age of 18 and her mother had an MI at the age of 34. Symptoms are severe and constant in nature. She reports 2 days of right lower extremity swelling.  Past Medical History  Diagnosis Date  . Crohn's disease   . Chronic back pain     Do NOT give narcotic RX!  CALLArlee Muslim, [phone], her pain specialist before giving any additional prescriptions  . COPD (chronic obstructive pulmonary disease)     possible.    . DDD (degenerative disc disease)   . Depression     refused psychiatry, counseling, or any medications other than benzos  . Anxiety   . Chronic leg pain     right leg "I don't have much feeling in it"  . Stroke    Past Surgical History  Procedure Laterality Date  . Appendectomy     Family History  Problem Relation Age of Onset  . Ovarian cancer Mother   . Heart disease Father   . Diabetes Mellitus I Father    History  Substance Use Topics  . Smoking status: Former Smoker -- 0.20 packs/day for 18 years    Types: Cigarettes    Quit date: 01/31/2009  . Smokeless tobacco: Never Used  . Alcohol Use: No   OB History    No data available     Review of Systems  Cardiovascular: Positive for chest pain.  All other  systems reviewed and are negative.     Allergies  Gabapentin; Gadolinium derivatives; and Naproxen  Home Medications   Prior to Admission medications   Medication Sig Start Date End Date Taking? Authorizing Provider  acetaminophen-codeine (TYLENOL #3) 300-30 MG per tablet Take 1 tablet by mouth every 8 (eight) hours as needed for moderate pain. 07/27/14   Jones Bales, NP  albuterol (PROVENTIL) (2.5 MG/3ML) 0.083% nebulizer solution Take 2.5 mg by nebulization 2 (two) times daily as needed for wheezing or shortness of breath.    Historical Provider, MD  Alum & Mag Hydroxide-Simeth (MAGIC MOUTHWASH W/LIDOCAINE) SOLN Take 5 mLs by mouth 3 (three) times daily as needed. Swish and swallow 5mL PO TID PRN indigestion pain 07/23/14   Mercedes Camprubi-Soms, PA-C  benzonatate (TESSALON) 100 MG capsule  07/14/14   Historical Provider, MD  clarithromycin (BIAXIN) 500 MG tablet  07/21/14   Historical Provider, MD  clonazePAM (KLONOPIN) 2 MG tablet Take 2 mg by mouth 2 (two) times daily. Alternates with valium every month    Historical Provider, MD  cyclobenzaprine (FLEXERIL) 10 MG tablet  07/14/14   Historical Provider, MD  diazepam (VALIUM) 5 MG tablet Take 5 mg by mouth 2 (two) times daily. Alternates with klonopin  every month    Historical Provider, MD  gabapentin (NEURONTIN) 100 MG capsule Take 100 mg by mouth.    Historical Provider, MD  guaiFENesin-codeine (ROBITUSSIN AC) 100-10 MG/5ML syrup Take 5 mLs by mouth 3 (three) times daily as needed for cough.    Historical Provider, MD  hydrocortisone (CORTEF) 10 MG tablet Take one and a half tabs at 6AM and one tab at 1PM every day. Patient not taking: Reported on 06/16/2014 02/02/14   Renae Fickle, MD  hydrOXYzine (VISTARIL) 50 MG capsule Take 50 mg by mouth.    Historical Provider, MD  ibuprofen (ADVIL,MOTRIN) 200 MG tablet Take 800 mg by mouth every 8 (eight) hours as needed for moderate pain.     Historical Provider, MD  levocetirizine (XYZAL) 5 MG  tablet  07/21/14   Historical Provider, MD  levothyroxine (SYNTHROID, LEVOTHROID) 25 MCG tablet Take 25 mcg by mouth daily. 07/02/14   Historical Provider, MD  methotrexate (RHEUMATREX) 2.5 MG tablet Take 2.5 mg by mouth once a week. 05/11/14   Historical Provider, MD  methylPREDNIsolone (MEDROL DOSPACK) 4 MG tablet follow package directions.  Please skip DAY 1 and 2 and start with DAY 3. Patient not taking: Reported on 06/16/2014 02/04/14   Catarina Hartshorn, MD  NEOMYCIN-POLYMYXIN-HYDROCORTISONE (CORTISPORIN) 1 % SOLN otic solution  07/09/14   Historical Provider, MD  ofloxacin (OCUFLOX) 0.3 % ophthalmic solution  07/01/14   Historical Provider, MD  oxyCODONE-acetaminophen (PERCOCET) 5-325 MG per tablet Take 1 tablet by mouth every 4 (four) hours as needed. Patient not taking: Reported on 07/22/2014 04/03/14   Eber Hong, MD  penicillin v potassium (VEETID) 500 MG tablet Take 500 mg by mouth 2 (two) times daily. 07/15/14   Historical Provider, MD  polyethylene glycol powder (MIRALAX) powder Take 17 g by mouth daily. Patient taking differently: Take 17 g by mouth daily as needed for moderate constipation.  02/02/14   Renae Fickle, MD  predniSONE (DELTASONE) 5 MG tablet As directed 07/21/14   Historical Provider, MD  PROAIR HFA 108 (90 BASE) MCG/ACT inhaler  07/21/14   Historical Provider, MD  Tiotropium Bromide Monohydrate (SPIRIVA RESPIMAT) 2.5 MCG/ACT AERS Inhale 2 puffs into the lungs daily.    Historical Provider, MD   BP 109/60 mmHg  Pulse 121  Resp 22  SpO2 96%  LMP  (LMP Unknown) Physical Exam  Constitutional: She is oriented to person, place, and time. She appears well-developed and well-nourished.  HENT:  Head: Normocephalic and atraumatic.  Cardiovascular: Normal rate and regular rhythm.   No murmur heard. Pulmonary/Chest: Effort normal and breath sounds normal. No respiratory distress.  Abdominal: Soft. There is no rebound and no guarding.  Moderate diffuse tenderness without guarding or  rebound  Musculoskeletal: She exhibits no edema or tenderness.  Neurological: She is alert and oriented to person, place, and time.  Skin: Skin is warm and dry. There is pallor.  Psychiatric: She has a normal mood and affect. Her behavior is normal.  Nursing note and vitals reviewed.   ED Course  Procedures (including critical care time) Labs Review Labs Reviewed  CBC WITH DIFFERENTIAL/PLATELET - Abnormal; Notable for the following:    WBC 11.3 (*)    Platelets 480 (*)    All other components within normal limits  COMPREHENSIVE METABOLIC PANEL - Abnormal; Notable for the following:    CO2 20 (*)    Glucose, Bld 101 (*)    Calcium 8.8 (*)    Total Bilirubin <0.1 (*)    All other  components within normal limits  D-DIMER, QUANTITATIVE (NOT AT [facility]) - Abnormal; Notable for the following:    D-Dimer, Quant 0.55 (*)    All other components within normal limits  LIPASE, BLOOD - Abnormal; Notable for the following:    Lipase 18 (*)    All other components within normal limits  POC URINE PREG, ED  Rosezena Sensor, ED    Imaging Review Dg Chest 2 View  11/13/2014   CLINICAL DATA:  Chest pain for 1 day  EXAM: CHEST  2 VIEW  COMPARISON:  July 22, 2014  FINDINGS: There is no edema or consolidation. The heart size and pulmonary vascularity are normal. No adenopathy. No pneumothorax. No bone lesions.  IMPRESSION: No abnormality noted.   Electronically Signed   By: Bretta Bang III M.D.   On: 11/13/2014 21:17     EKG Interpretation   Date/Time:  Saturday November 13 2014 20:37:19 EDT Ventricular Rate:  121 PR Interval:  140 QRS Duration: 82 QT Interval:  324 QTC Calculation: 460 R Axis:   125 Text Interpretation:  Age not entered, assumed to be  34 years old for  purpose of ECG interpretation Sinus tachycardia Ventricular premature  complex Aberrant complex Probable left atrial enlargement Consider right  ventricular hypertrophy Borderline T abnormalities, anterior leads   Confirmed by Lincoln Brigham [phone]) on 11/13/2014 8:41:09 PM      MDM   Final diagnoses:  Chest pain, unspecified chest pain type    Patient here for evaluation of constant central chest pain that started many hours prior to ED arrival. History and presentation is not consistent with ACS. Patient does report that her right leg feels a little swollen compared to the left, d-dimer is mildly elevated although patient has no risk factors for DVT or PE. CT scan is negative for acute PE or pathology. History presentation is not consistent with dissection, biliary colic. Upon discussion of findings a studies patient is adding on multiple additional complaints such as pain in the bottoms of her feet, numbness in her legs and her face. Reviewed prior records through care everywhere patient has been discontinued from her pain management Center  due to a positive UDS. she has a history of previous similar presentations in [facility] as well as outside systems. Discussed with patient the source of pain is identified today but she can take Tylenol or ibuprofen at home with close outpatient follow-up.   Tilden Fossa, MD 11/14/14 [phone]

## 2014-11-14 LAB — I-STAT TROPONIN, ED: Troponin i, poc: 0 ng/mL (ref 0.00–0.08)

## 2014-11-14 MED ORDER — HYDROMORPHONE HCL 1 MG/ML IJ SOLN
1.0000 mg | Freq: Once | INTRAMUSCULAR | Status: AC
  Administered 2014-11-14: 1 mg via INTRAVENOUS

## 2014-11-14 MED ORDER — GI COCKTAIL ~~LOC~~
30.0000 mL | Freq: Once | ORAL | Status: AC
  Administered 2014-11-14: 30 mL via ORAL
  Filled 2014-11-14: qty 30

## 2014-11-14 MED ORDER — HYDROMORPHONE HCL 1 MG/ML IJ SOLN
INTRAMUSCULAR | Status: AC
  Filled 2014-11-14: qty 1

## 2014-11-14 NOTE — ED Notes (Signed)
Pt and her husband are concerned that the pt is still having pain after all intervention. Ranae Palms, MD notified.

## 2014-11-14 NOTE — Discharge Instructions (Signed)

## 2014-11-21 DIAGNOSIS — R079 Chest pain, unspecified: Secondary | ICD-10-CM | POA: Insufficient documentation

## 2014-12-26 ENCOUNTER — Emergency Department (HOSPITAL_COMMUNITY)
Admission: EM | Admit: 2014-12-26 | Discharge: 2014-12-26 | Disposition: A | Discharge status: Home / Self Care | Payer: Medicare Other | Attending: Emergency Medicine | Admitting: Emergency Medicine

## 2014-12-26 ENCOUNTER — Encounter (HOSPITAL_COMMUNITY): Payer: Self-pay | Admitting: Emergency Medicine

## 2014-12-26 DIAGNOSIS — M545 Low back pain, unspecified: Secondary | ICD-10-CM

## 2014-12-26 DIAGNOSIS — G8929 Other chronic pain: Secondary | ICD-10-CM | POA: Insufficient documentation

## 2014-12-26 DIAGNOSIS — Z8673 Personal history of transient ischemic attack (TIA), and cerebral infarction without residual deficits: Secondary | ICD-10-CM | POA: Diagnosis not present

## 2014-12-26 DIAGNOSIS — Z8719 Personal history of other diseases of the digestive system: Secondary | ICD-10-CM | POA: Insufficient documentation

## 2014-12-26 DIAGNOSIS — Z87891 Personal history of nicotine dependence: Secondary | ICD-10-CM | POA: Insufficient documentation

## 2014-12-26 DIAGNOSIS — Z792 Long term (current) use of antibiotics: Secondary | ICD-10-CM | POA: Diagnosis not present

## 2014-12-26 DIAGNOSIS — F329 Major depressive disorder, single episode, unspecified: Secondary | ICD-10-CM | POA: Insufficient documentation

## 2014-12-26 DIAGNOSIS — F419 Anxiety disorder, unspecified: Secondary | ICD-10-CM | POA: Insufficient documentation

## 2014-12-26 DIAGNOSIS — Z79899 Other long term (current) drug therapy: Secondary | ICD-10-CM | POA: Diagnosis not present

## 2014-12-26 DIAGNOSIS — J449 Chronic obstructive pulmonary disease, unspecified: Secondary | ICD-10-CM | POA: Insufficient documentation

## 2014-12-26 MED ORDER — METHOCARBAMOL 500 MG PO TABS
1000.0000 mg | ORAL_TABLET | Freq: Once | ORAL | Status: AC
  Administered 2014-12-26: 1000 mg via ORAL
  Filled 2014-12-26: qty 2

## 2014-12-26 MED ORDER — KETOROLAC TROMETHAMINE 60 MG/2ML IM SOLN
60.0000 mg | Freq: Once | INTRAMUSCULAR | Status: AC
  Administered 2014-12-26: 60 mg via INTRAMUSCULAR
  Filled 2014-12-26: qty 2

## 2014-12-26 MED ORDER — METHOCARBAMOL 500 MG PO TABS: 1000.0000 mg | ORAL_TABLET | Freq: Two times a day (BID) | ORAL | Status: DC

## 2014-12-26 NOTE — Discharge Instructions (Signed)

## 2014-12-26 NOTE — ED Notes (Signed)
Pt from home c/o low back pain that radiates to right buttock and right foot is numb. HX of DDD

## 2014-12-26 NOTE — ED Provider Notes (Signed)
CSN: [id number]     Arrival date & time 12/26/14  0434 History   First MD Initiated Contact with Patient 12/26/14 [phone]     Chief Complaint  Patient presents with  . Back Pain     (Consider location/radiation/quality/duration/timing/severity/associated sxs/prior Treatment) Patient is a 34 y.o. female presenting with back pain. The history is provided by the patient. No language interpreter was used.  Back Pain Location:  Lumbar spine Quality:  Aching and shooting Radiates to:  R foot Associated symptoms: numbness   Associated symptoms: no fever, no headaches and no weakness   Associated symptoms comment:  Presents with low back pain radiating into the right foot with numbness. She has a history of chronic back pain with radiculopathy. No previous surgeries. No abdominal pain, vomiting or fever. No new injury or trauma. No urinary/bowel incontinence.    Past Medical History  Diagnosis Date  . Crohn's disease   . Chronic back pain     Do NOT give narcotic RX!  CALLArlee Muslim, [phone], her pain specialist before giving any additional prescriptions  . COPD (chronic obstructive pulmonary disease)     possible.    . DDD (degenerative disc disease)   . Depression     refused psychiatry, counseling, or any medications other than benzos  . Anxiety   . Chronic leg pain     right leg "I don't have much feeling in it"  . Stroke    Past Surgical History  Procedure Laterality Date  . Appendectomy     Family History  Problem Relation Age of Onset  . Ovarian cancer Mother   . Heart disease Father   . Diabetes Mellitus I Father    Social History  Substance Use Topics  . Smoking status: Former Smoker -- 0.20 packs/day for 18 years    Types: Cigarettes    Quit date: 01/31/2009  . Smokeless tobacco: Never Used  . Alcohol Use: No   OB History    No data available     Review of Systems  Constitutional: Negative for fever and chills.  Gastrointestinal: Negative.   Negative for nausea.  Musculoskeletal: Positive for back pain.  Skin: Negative.   Neurological: Positive for numbness. Negative for weakness and headaches.      Allergies  Gabapentin; Gadolinium derivatives; and Naproxen  Home Medications   Prior to Admission medications   Medication Sig Start Date End Date Taking? Authorizing Provider  acetaminophen-codeine (TYLENOL #3) 300-30 MG per tablet Take 1 tablet by mouth every 8 (eight) hours as needed for moderate pain. 07/27/14   Jones Bales, NP  albuterol (PROVENTIL) (2.5 MG/3ML) 0.083% nebulizer solution Take 2.5 mg by nebulization 2 (two) times daily as needed for wheezing or shortness of breath.    Historical Provider, MD  Alum & Mag Hydroxide-Simeth (MAGIC MOUTHWASH W/LIDOCAINE) SOLN Take 5 mLs by mouth 3 (three) times daily as needed. Swish and swallow 5mL PO TID PRN indigestion pain 07/23/14   Mercedes Camprubi-Soms, PA-C  benzonatate (TESSALON) 100 MG capsule  07/14/14   Historical Provider, MD  clarithromycin (BIAXIN) 500 MG tablet  07/21/14   Historical Provider, MD  clonazePAM (KLONOPIN) 2 MG tablet Take 2 mg by mouth 2 (two) times daily. Alternates with valium every month    Historical Provider, MD  cyclobenzaprine (FLEXERIL) 10 MG tablet  07/14/14   Historical Provider, MD  diazepam (VALIUM) 5 MG tablet Take 5 mg by mouth 2 (two) times daily. Alternates with klonopin every month  Historical Provider, MD  gabapentin (NEURONTIN) 100 MG capsule Take 100 mg by mouth.    Historical Provider, MD  guaiFENesin-codeine (ROBITUSSIN AC) 100-10 MG/5ML syrup Take 5 mLs by mouth 3 (three) times daily as needed for cough.    Historical Provider, MD  hydrocortisone (CORTEF) 10 MG tablet Take one and a half tabs at 6AM and one tab at 1PM every day. Patient not taking: Reported on 06/16/2014 02/02/14   Renae Fickle, MD  hydrOXYzine (VISTARIL) 50 MG capsule Take 50 mg by mouth.    Historical Provider, MD  ibuprofen (ADVIL,MOTRIN) 200 MG tablet Take  800 mg by mouth every 8 (eight) hours as needed for moderate pain.     Historical Provider, MD  levocetirizine (XYZAL) 5 MG tablet  07/21/14   Historical Provider, MD  levothyroxine (SYNTHROID, LEVOTHROID) 25 MCG tablet Take 25 mcg by mouth daily. 07/02/14   Historical Provider, MD  methotrexate (RHEUMATREX) 2.5 MG tablet Take 2.5 mg by mouth once a week. 05/11/14   Historical Provider, MD  methylPREDNIsolone (MEDROL DOSPACK) 4 MG tablet follow package directions.  Please skip DAY 1 and 2 and start with DAY 3. Patient not taking: Reported on 06/16/2014 02/04/14   Catarina Hartshorn, MD  NEOMYCIN-POLYMYXIN-HYDROCORTISONE (CORTISPORIN) 1 % SOLN otic solution  07/09/14   Historical Provider, MD  ofloxacin (OCUFLOX) 0.3 % ophthalmic solution  07/01/14   Historical Provider, MD  oxyCODONE-acetaminophen (PERCOCET) 5-325 MG per tablet Take 1 tablet by mouth every 4 (four) hours as needed. Patient not taking: Reported on 07/22/2014 04/03/14   Eber Hong, MD  penicillin v potassium (VEETID) 500 MG tablet Take 500 mg by mouth 2 (two) times daily. 07/15/14   Historical Provider, MD  polyethylene glycol powder (MIRALAX) powder Take 17 g by mouth daily. Patient taking differently: Take 17 g by mouth daily as needed for moderate constipation.  02/02/14   Renae Fickle, MD  predniSONE (DELTASONE) 5 MG tablet As directed 07/21/14   Historical Provider, MD  PROAIR HFA 108 (90 BASE) MCG/ACT inhaler  07/21/14   Historical Provider, MD  Tiotropium Bromide Monohydrate (SPIRIVA RESPIMAT) 2.5 MCG/ACT AERS Inhale 2 puffs into the lungs daily.    Historical Provider, MD   BP 96/82 mmHg  Pulse 106  Temp(Src) 97.6 F (36.4 C) (Oral)  Resp 16  SpO2 98%  LMP 12/12/2014 Physical Exam  Constitutional: She is oriented to person, place, and time. She appears well-developed and well-nourished.  Neck: Normal range of motion.  Pulmonary/Chest: Effort normal.  Abdominal: There is no tenderness.  Musculoskeletal: She exhibits no edema.   Bilateral paraspinal and midline lumbar tenderness. No swelling or discoloration.  Neurological: She is alert and oriented to person, place, and time. She has normal reflexes.  Reflexes are strong and equal bilateral lower extremities.   Skin: Skin is warm and dry.  Psychiatric: She has a normal mood and affect.    ED Course  Procedures (including critical care time) Labs Review Labs Reviewed - No data to display  Imaging Review No results found. I have personally reviewed and evaluated these images and lab results as part of my medical decision-making.   EKG Interpretation None      MDM   Final diagnoses:  None    1. Chronic low back pain  The patient reports current pain is similar to chronic pain but is currently uncontrolled. She is scheduled to follow up with the Pain Management Clinic in 2 weeks. Per chart review, she has been discharged from Pain Clinics  in the past for non-compliance/obtaining outside narcotic prescriptions.   [place] Controlled Substance Database: 12/21/14          #20 7.5mg /325mg  Oxycodone   #60 5mg  Valium 12/14/14 #20 7/5mg /325mg  Oxycodone   #60 2 mg Clonazepam 11/21/14 #12 5mg /345mb Oxycodone 11/16/14 #20 7.5mg /325mg  Oxycodone   #90 2mg  Clonazepam 11/09/14 #60 5mg  Diazepam 11/04/14 #20 7.5mg /325mg  Oxycodone  No neurologic deficits on exam tonight. Discussed that chronic pain is not appropriately managed in the emergency department. Will provide IM Toradol and Robaxin and encouraged PCP follow up for pain control until Pain Management Appointment.   Elpidio Anis, PA-C 12/26/14 0530  Jerelyn Scott, MD 12/26/14 [phone]

## 2015-01-17 IMAGING — CT CT ABD-PELV W/ CM
2 of 4 series · 11 of 46 positions shown, 12 images · IV contrast (omnipaque)
Comparison: 11/30/2013

CLINICAL DATA: Abdominal and back pain. Vomiting. Crohn's disease.

EXAM:
CT ABDOMEN AND PELVIS WITH CONTRAST
TECHNIQUE: Multidetector CT imaging of the abdomen and pelvis was performed
using the standard protocol following bolus administration of
intravenous contrast.
CONTRAST:  80mL OMNIPAQUE IOHEXOL 300 MG/ML  SOLN

[Series 201: routine, idose (2) · axial · 0.80mm/px · z∈[-489,-39]mm · 8 of 110 slices shown, 9 images]
[im 10/110  soft-tissue]
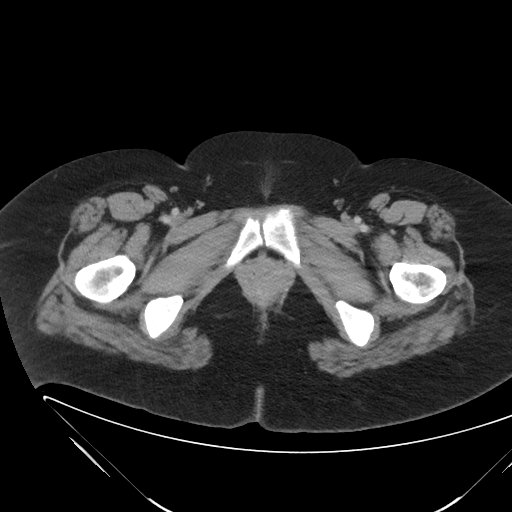
[im 10/110  bone]
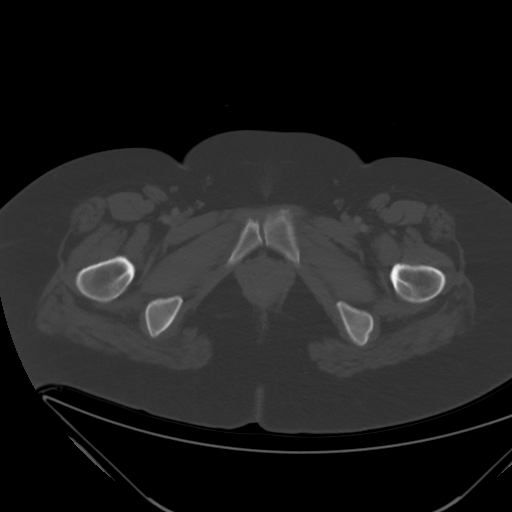
[im 23/110  soft-tissue]
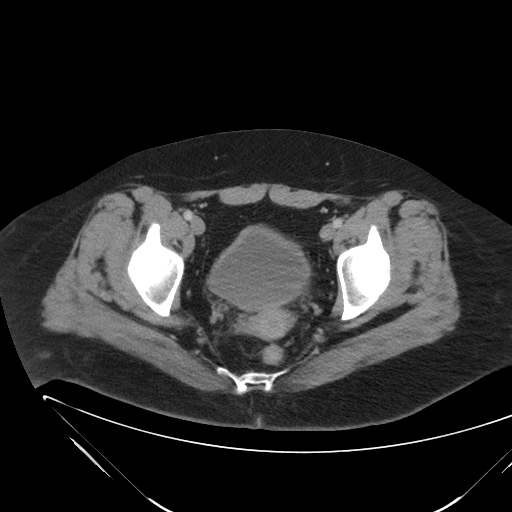
[im 37/110  soft-tissue]
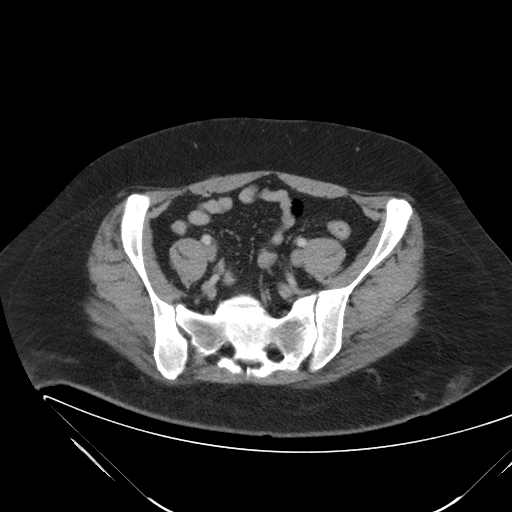
[im 50/110  soft-tissue]
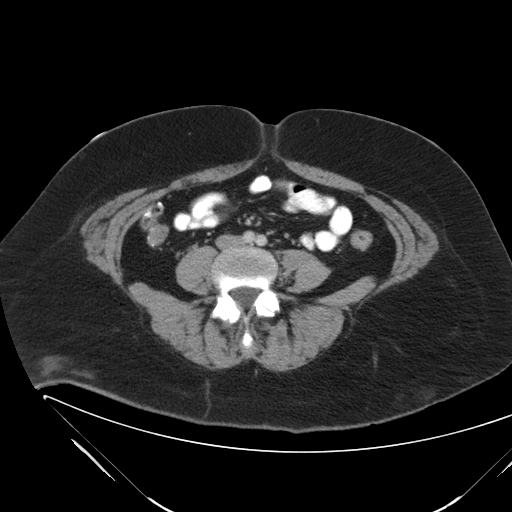
[im 60/110  soft-tissue]
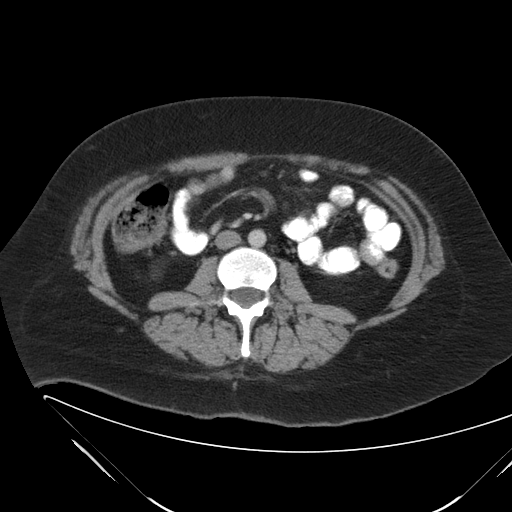
[im 73/110  soft-tissue]
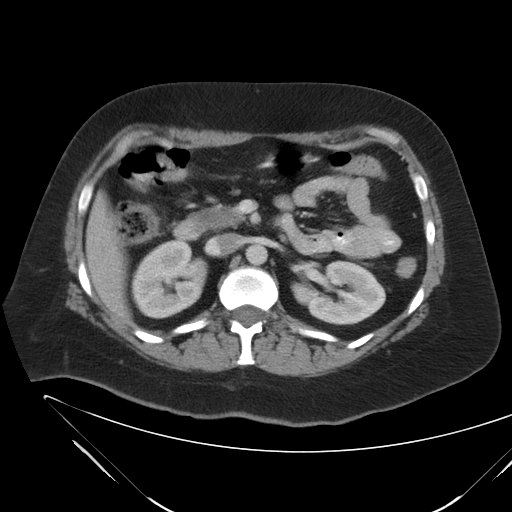
[im 87/110  soft-tissue]
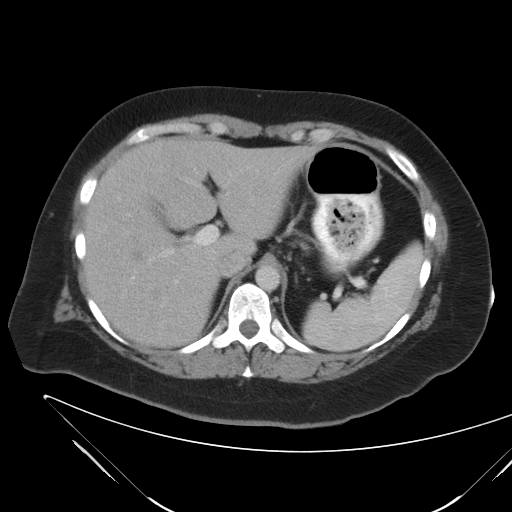
[im 100/110  soft-tissue]
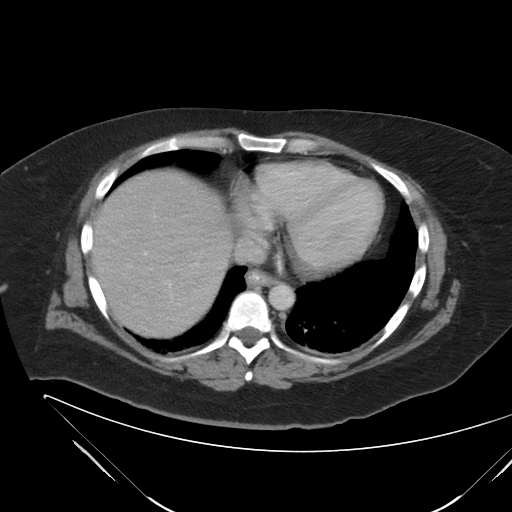

[Series 203: coronals, idose (2) · coronal · 0.50mm/px · 3 of 118 slices shown]
[im 40/118  soft-tissue]
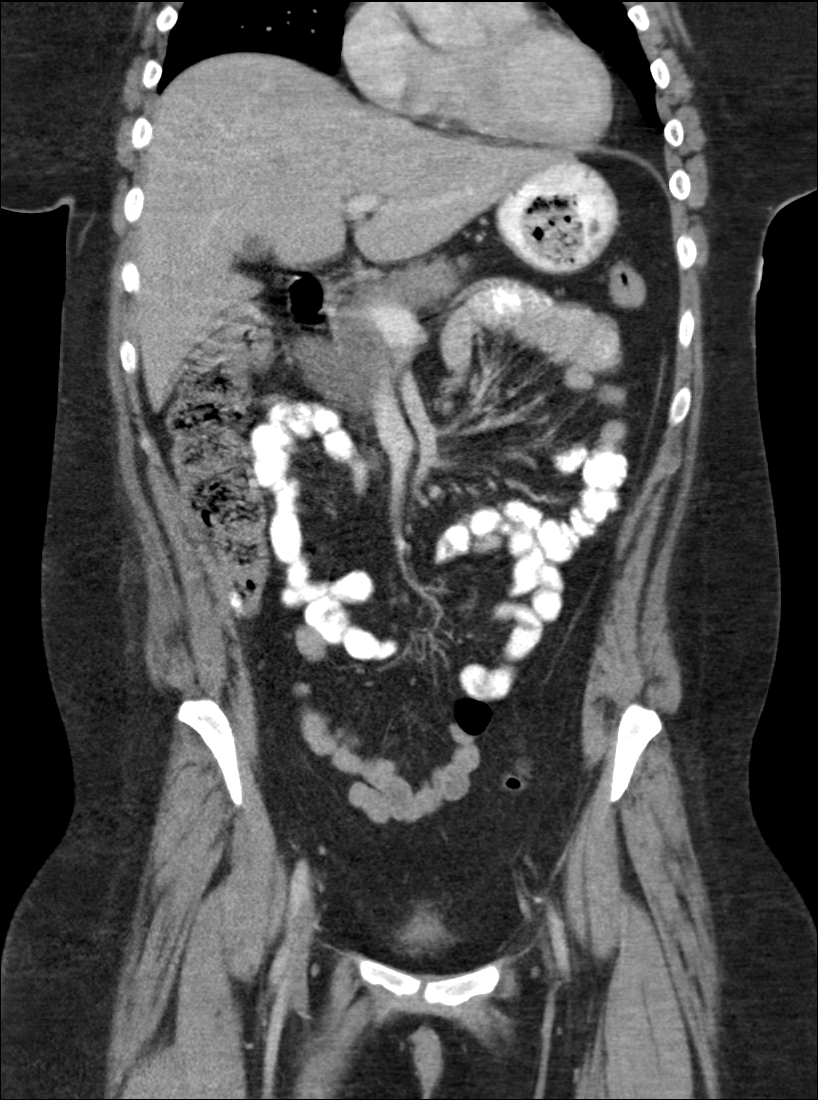
[im 53/118  soft-tissue]
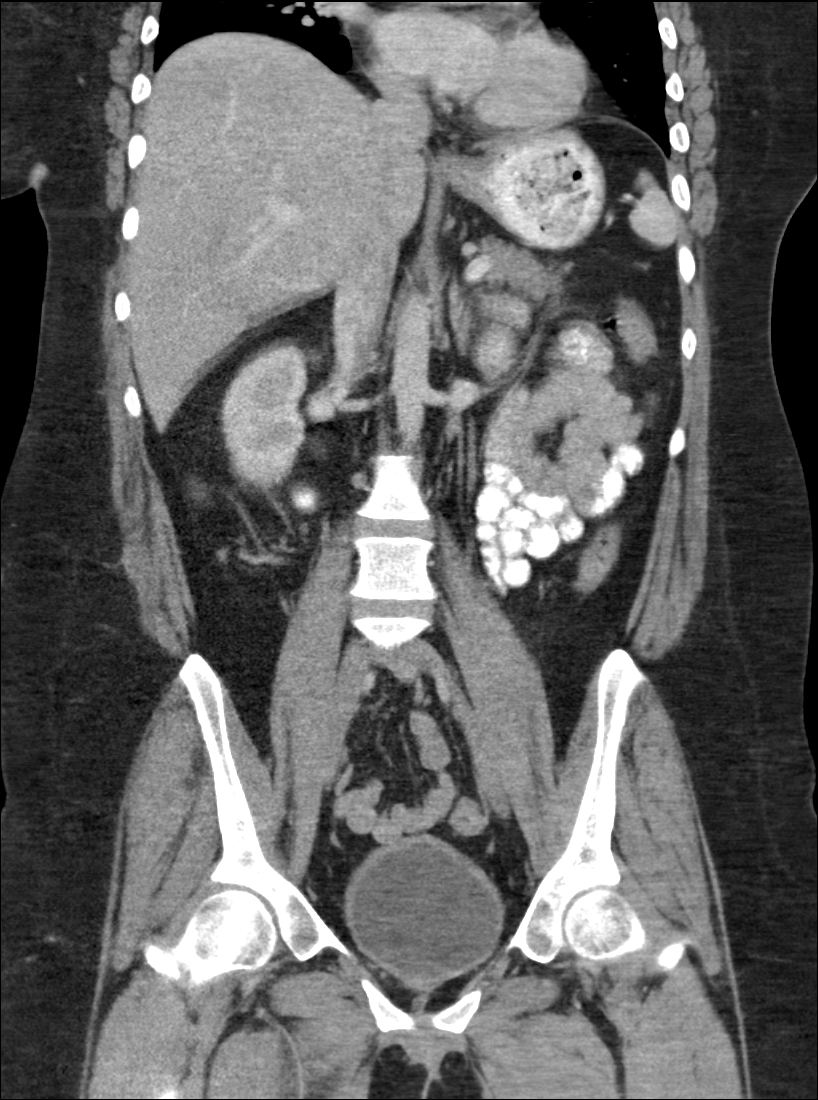
[im 66/118  soft-tissue]
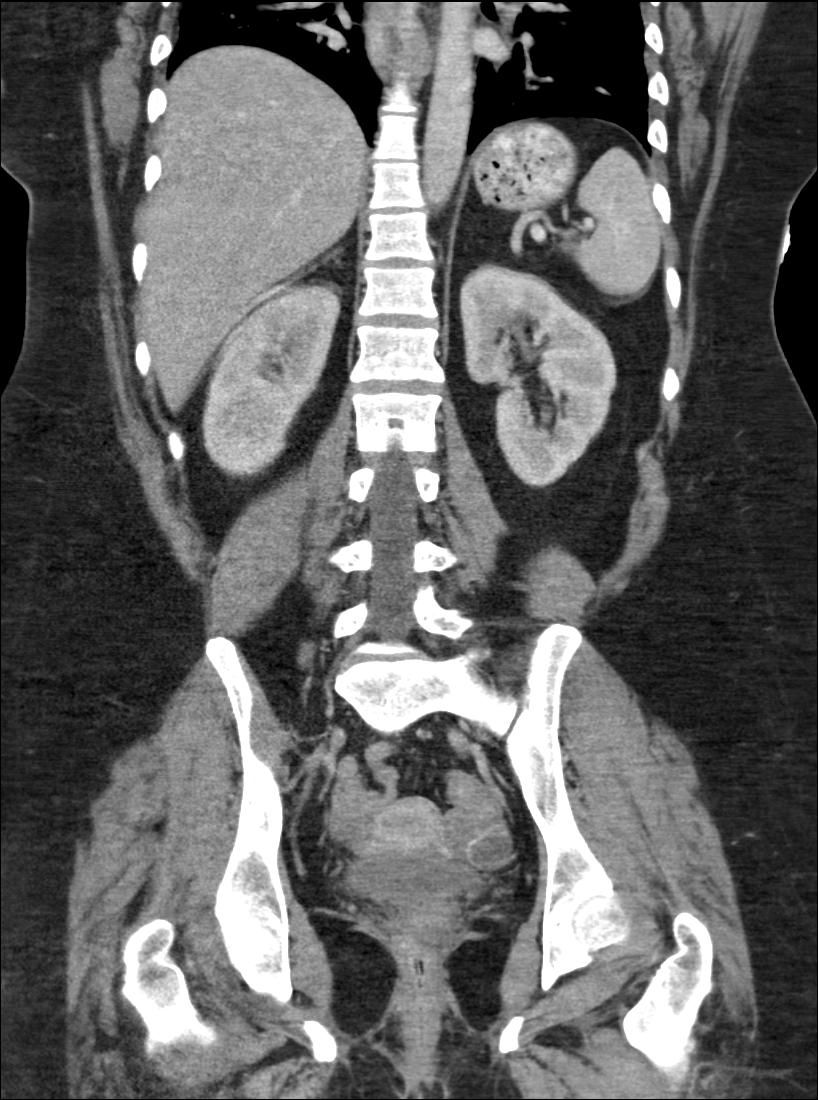

[11 of 46 positions shown; findings below may reference images not displayed]

FINDINGS: Liver:  No masses or other significant abnormality identified.

Gallbladder/Biliary:  Unremarkable.

Pancreas: No mass, inflammatory changes, or other parenchymal
abnormality identified.

Spleen:  Within normal limits in size and appearance.

Adrenal Glands:  No mass identified.

Kidneys/Urinary Tract: No masses identified. No evidence of
hydronephrosis.

Lymph Nodes:  No pathologically enlarged lymph nodes identified.

Pelvic/Reproductive Organs: No mass or other significant abnormality
identified. Benign appearing left ovarian cyst or follicle noted
measuring 2.4 cm.

Bowel/Peritoneum: No evidence of bowel wall thickening, mass, or
obstruction.

Vascular:  No evidence of abdominal aortic aneurysm.

Musculoskeletal:  No suspicious bone lesions identified.

Other:  None.
IMPRESSION: 2.4 cm benign appearing left ovarian cyst or follicle noted, most
likely physiologic in a reproductive age female.

No evidence of active inflammatory bowel disease or other acute
findings.

## 2015-04-25 IMAGING — DX DG CHEST 2V
2 series · 2 of 2 positions shown · non-contrast
Comparison: April 03, 2014.

CLINICAL DATA: Dizziness, shortness of breath.

EXAM:
CHEST  2 VIEW

[chest lat]
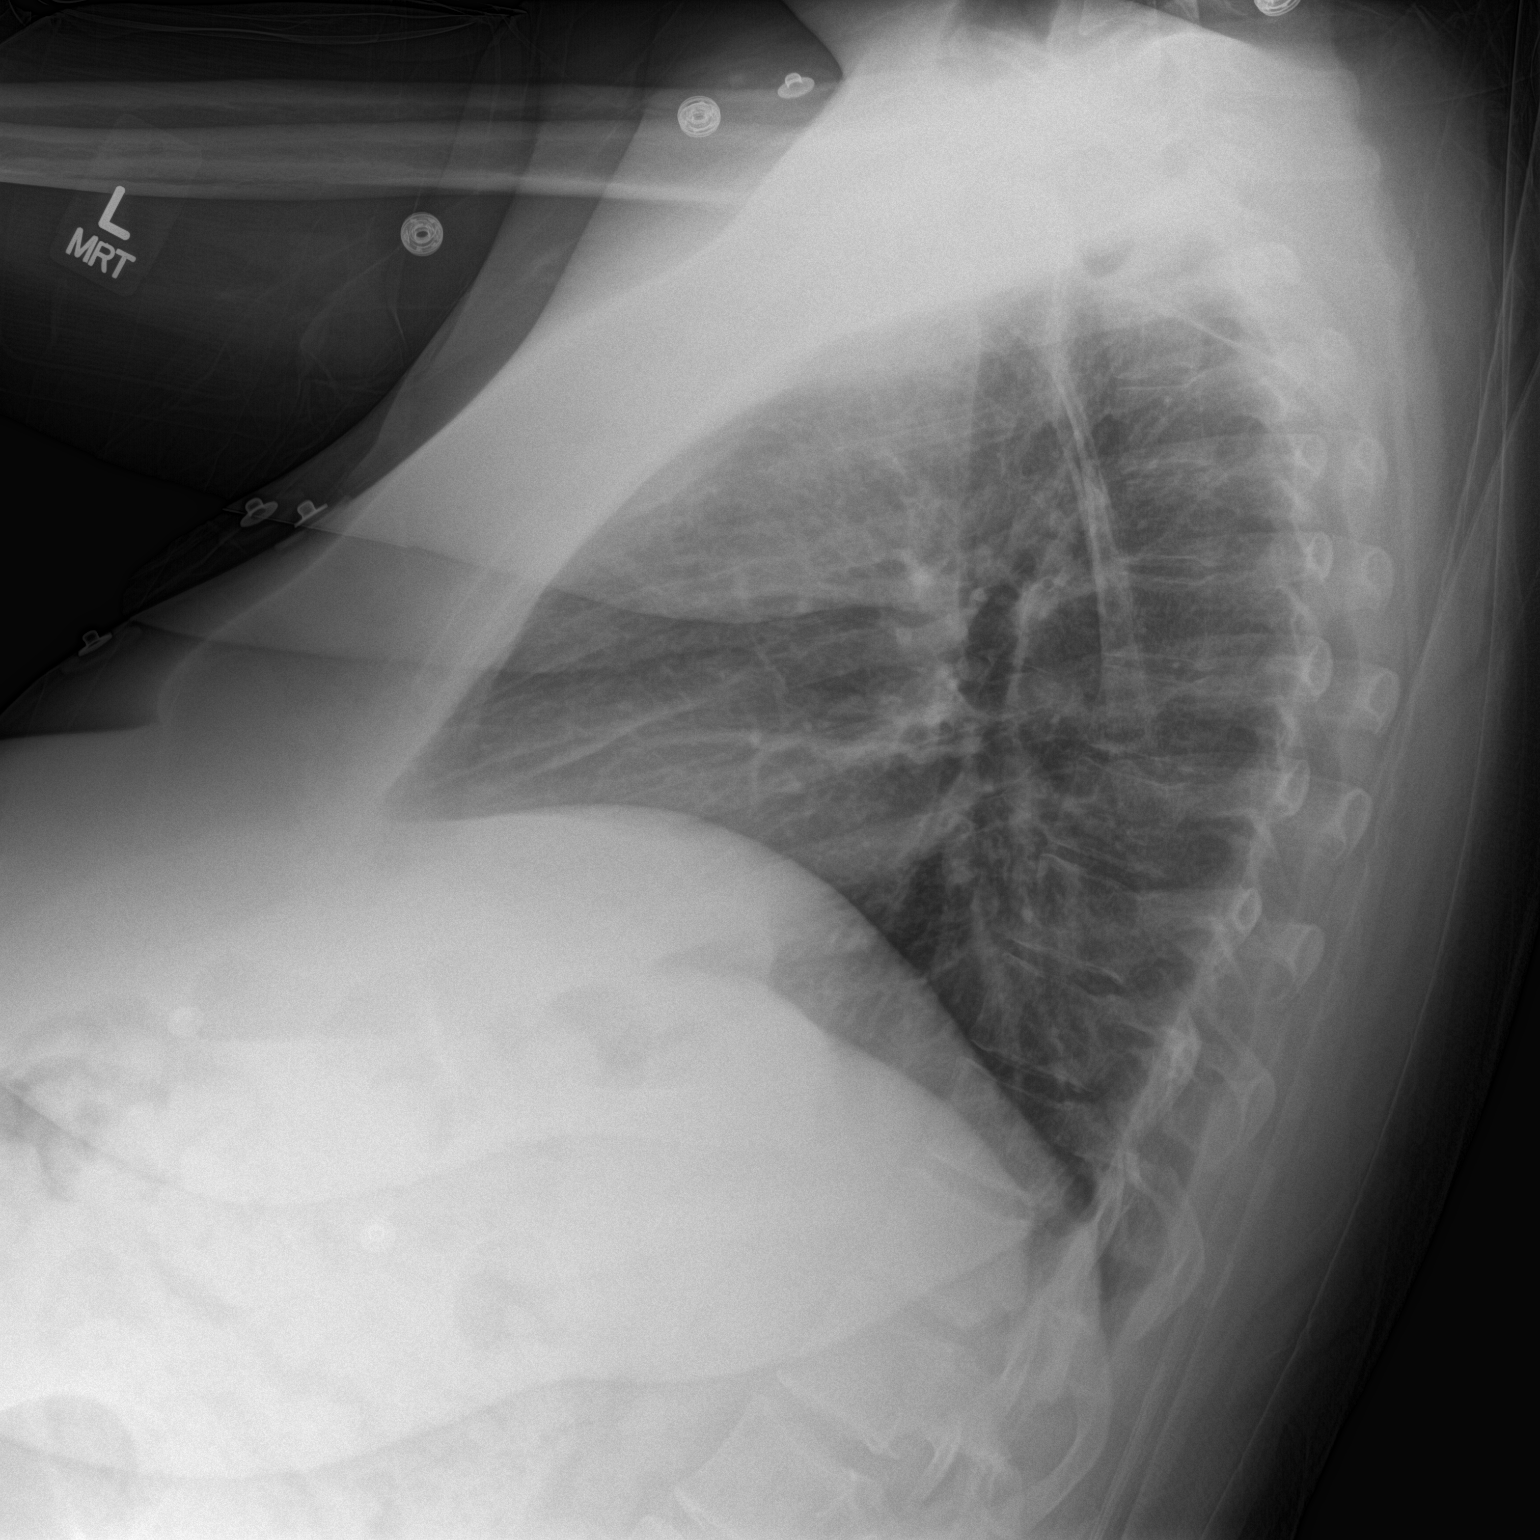

[chest ap]
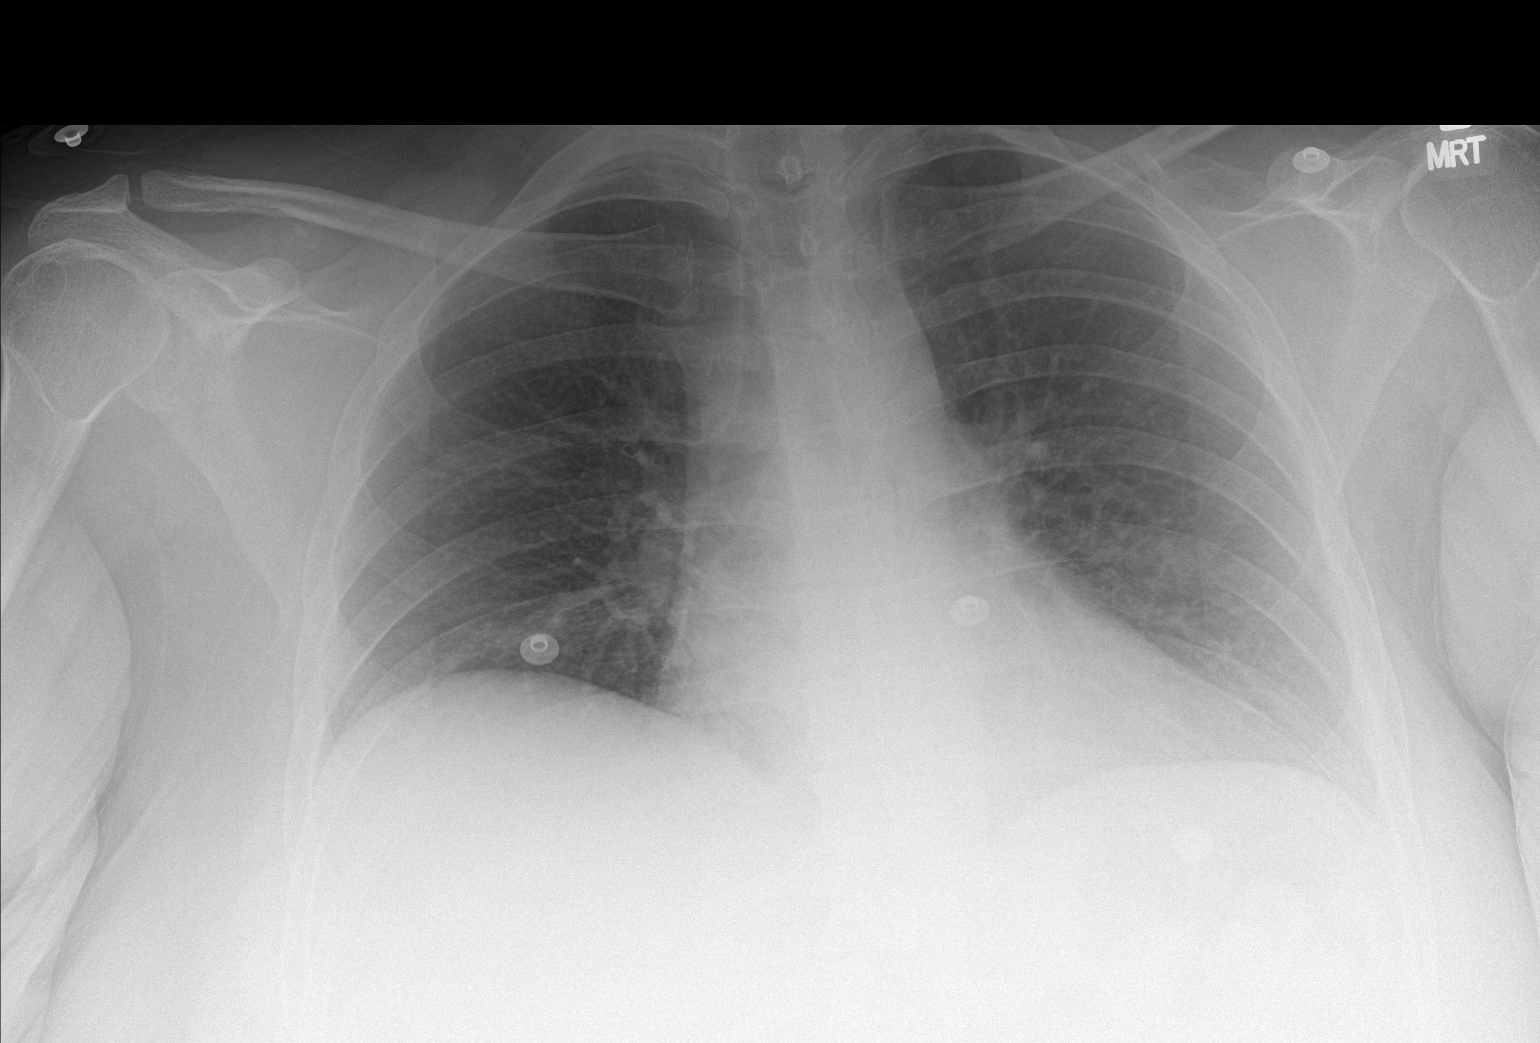

[2 of 2 positions shown; findings below may reference images not displayed]

FINDINGS: The heart size and mediastinal contours are within normal limits.
Both lungs are clear. No pneumothorax or pleural effusion is noted.
The visualized skeletal structures are unremarkable.
IMPRESSION: No acute cardiopulmonary abnormality seen.

## 2015-10-17 ENCOUNTER — Ambulatory Visit: Payer: Self-pay | Admitting: Gastroenterology

## 2015-10-25 ENCOUNTER — Encounter (HOSPITAL_COMMUNITY): Payer: Self-pay | Admitting: Emergency Medicine

## 2015-10-25 ENCOUNTER — Emergency Department (HOSPITAL_COMMUNITY): Payer: Medicare Other

## 2015-10-25 ENCOUNTER — Emergency Department (HOSPITAL_COMMUNITY)
Admission: EM | Admit: 2015-10-25 | Discharge: 2015-10-25 | Disposition: A | Discharge status: Home / Self Care | Payer: Medicare Other | Attending: Emergency Medicine | Admitting: Emergency Medicine

## 2015-10-25 DIAGNOSIS — Z87891 Personal history of nicotine dependence: Secondary | ICD-10-CM | POA: Insufficient documentation

## 2015-10-25 DIAGNOSIS — R0602 Shortness of breath: Secondary | ICD-10-CM | POA: Diagnosis not present

## 2015-10-25 DIAGNOSIS — M1990 Unspecified osteoarthritis, unspecified site: Secondary | ICD-10-CM | POA: Insufficient documentation

## 2015-10-25 DIAGNOSIS — F419 Anxiety disorder, unspecified: Secondary | ICD-10-CM | POA: Diagnosis present

## 2015-10-25 DIAGNOSIS — F329 Major depressive disorder, single episode, unspecified: Secondary | ICD-10-CM | POA: Diagnosis not present

## 2015-10-25 DIAGNOSIS — J449 Chronic obstructive pulmonary disease, unspecified: Secondary | ICD-10-CM | POA: Insufficient documentation

## 2015-10-25 DIAGNOSIS — Z79899 Other long term (current) drug therapy: Secondary | ICD-10-CM | POA: Insufficient documentation

## 2015-10-25 DIAGNOSIS — Z8673 Personal history of transient ischemic attack (TIA), and cerebral infarction without residual deficits: Secondary | ICD-10-CM | POA: Diagnosis not present

## 2015-10-25 MED ORDER — DIAZEPAM 5 MG PO TABS
5.0000 mg | ORAL_TABLET | Freq: Once | ORAL | Status: AC
  Administered 2015-10-25: 5 mg via ORAL
  Filled 2015-10-25: qty 1

## 2015-10-25 NOTE — ED Notes (Addendum)
Per patient's husband, patient has anxiety and PTSD.  Patient was seeing a MD one month ago but office closed; therefore, she is without medication.  Patient is complaining of headache, chest pain, and dyspnea.  BP:108/62 HR:96 R:20  97% on room air

## 2015-10-25 NOTE — ED Notes (Signed)
Pt upset at discharge.  Demanding pain and anxiety script at discharge. Spoke with Md and husband at discharge.  MD requesting pt to get established with a primary care physician for her continued med needs. Pt refused vitals and paperwork and discharge.
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
## 2015-11-14 DIAGNOSIS — F332 Major depressive disorder, recurrent severe without psychotic features: Secondary | ICD-10-CM | POA: Insufficient documentation

## 2015-12-14 ENCOUNTER — Ambulatory Visit: Payer: Medicare Other | Admitting: Family Medicine

## 2016-01-09 ENCOUNTER — Ambulatory Visit: Payer: Medicare Other | Admitting: Family Medicine

## 2016-01-09 DIAGNOSIS — Z0289 Encounter for other administrative examinations: Secondary | ICD-10-CM

## 2016-02-15 DIAGNOSIS — R455 Hostility: Secondary | ICD-10-CM | POA: Insufficient documentation

## 2016-02-15 DIAGNOSIS — Z765 Malingerer [conscious simulation]: Secondary | ICD-10-CM

## 2016-02-15 HISTORY — DX: Malingerer (conscious simulation): Z76.5

## 2017-01-31 ENCOUNTER — Emergency Department (HOSPITAL_COMMUNITY)
Admission: EM | Admit: 2017-01-31 | Discharge: 2017-02-01 | Disposition: A | Discharge status: Home / Self Care | Payer: Medicare Other | Attending: Emergency Medicine | Admitting: Emergency Medicine

## 2017-01-31 ENCOUNTER — Encounter (HOSPITAL_COMMUNITY): Payer: Self-pay

## 2017-01-31 DIAGNOSIS — K5090 Crohn's disease, unspecified, without complications: Secondary | ICD-10-CM | POA: Diagnosis not present

## 2017-01-31 DIAGNOSIS — F411 Generalized anxiety disorder: Secondary | ICD-10-CM

## 2017-01-31 DIAGNOSIS — F410 Panic disorder [episodic paroxysmal anxiety] without agoraphobia: Secondary | ICD-10-CM

## 2017-01-31 DIAGNOSIS — F419 Anxiety disorder, unspecified: Secondary | ICD-10-CM | POA: Diagnosis not present

## 2017-01-31 DIAGNOSIS — J449 Chronic obstructive pulmonary disease, unspecified: Secondary | ICD-10-CM | POA: Insufficient documentation

## 2017-01-31 DIAGNOSIS — Z79899 Other long term (current) drug therapy: Secondary | ICD-10-CM | POA: Diagnosis not present

## 2017-01-31 DIAGNOSIS — Z87891 Personal history of nicotine dependence: Secondary | ICD-10-CM | POA: Insufficient documentation

## 2017-01-31 DIAGNOSIS — F4310 Post-traumatic stress disorder, unspecified: Secondary | ICD-10-CM | POA: Diagnosis not present

## 2017-01-31 LAB — CBC
HEMATOCRIT: 41.1 % (ref 36.0–46.0)
HEMOGLOBIN: 12.8 g/dL (ref 12.0–15.0)
MCH: 28 pg (ref 26.0–34.0)
MCHC: 31.1 g/dL (ref 30.0–36.0)
MCV: 89.9 fL (ref 78.0–100.0)
Platelets: 390 10*3/uL (ref 150–400)
RBC: 4.57 MIL/uL (ref 3.87–5.11)
RDW: 15.2 % (ref 11.5–15.5)
WBC: 12.8 10*3/uL — ABNORMAL HIGH (ref 4.0–10.5)

## 2017-01-31 LAB — RAPID URINE DRUG SCREEN, HOSP PERFORMED
Amphetamines: NOT DETECTED
Barbiturates: NOT DETECTED
Benzodiazepines: POSITIVE — AB
Cocaine: NOT DETECTED
OPIATES: NOT DETECTED
TETRAHYDROCANNABINOL: NOT DETECTED

## 2017-01-31 LAB — COMPREHENSIVE METABOLIC PANEL
ALBUMIN: 4 g/dL (ref 3.5–5.0)
ALT: 19 U/L (ref 14–54)
AST: 18 U/L (ref 15–41)
Alkaline Phosphatase: 100 U/L (ref 38–126)
Anion gap: 10 (ref 5–15)
BILIRUBIN TOTAL: 0.1 mg/dL — AB (ref 0.3–1.2)
BUN: 17 mg/dL (ref 6–20)
CO2: 22 mmol/L (ref 22–32)
Calcium: 8.9 mg/dL (ref 8.9–10.3)
Chloride: 106 mmol/L (ref 101–111)
Creatinine, Ser: 0.71 mg/dL (ref 0.44–1.00)
GFR calc Af Amer: >60 mL/min (ref >60)
GFR calc non Af Amer: >60 mL/min (ref >60)
GLUCOSE: 91 mg/dL (ref 65–99)
POTASSIUM: 4 mmol/L (ref 3.5–5.1)
Sodium: 138 mmol/L (ref 135–145)
TOTAL PROTEIN: 7.7 g/dL (ref 6.5–8.1)

## 2017-01-31 LAB — ETHANOL: Alcohol, Ethyl (B): <10 mg/dL (ref <10)

## 2017-01-31 LAB — PREGNANCY, URINE: PREG TEST UR: NEGATIVE

## 2017-01-31 MED ORDER — LORAZEPAM 1 MG PO TABS
1.0000 mg | ORAL_TABLET | Freq: Once | ORAL | Status: AC
  Administered 2017-01-31: 1 mg via ORAL
  Filled 2017-01-31: qty 1

## 2017-01-31 MED ORDER — NICOTINE 21 MG/24HR TD PT24: 21.0000 mg | MEDICATED_PATCH | Freq: Every day | TRANSDERMAL | Status: DC

## 2017-01-31 MED ORDER — ACETAMINOPHEN 325 MG PO TABS: 650.0000 mg | ORAL_TABLET | ORAL | Status: DC | PRN

## 2017-01-31 MED ORDER — ONDANSETRON HCL 4 MG PO TABS: 4.0000 mg | ORAL_TABLET | Freq: Three times a day (TID) | ORAL | Status: DC | PRN

## 2017-01-31 NOTE — ED Notes (Signed)
Bed: WTR5 Expected date:  Expected time:  Means of arrival:  Comments: 
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
## 2017-01-31 NOTE — ED Triage Notes (Signed)
Pt states she needs ativan for her anxiety, she was seen at [facility] yesterday and evaluated and referred to [facility] in [facility], pt said she went but they don't have a doctor Pt is very anxious and agitated in triage Pt's husband thinks she needs inpatient to get stabilized  Pt also needs a psychiatrist to manage her anxiety and PTSD

## 2017-01-31 NOTE — ED Provider Notes (Signed)
[facility]-EMERGENCY DEPT Provider Note   CSN: [id number] Arrival date & time: 01/31/17  2059    History   Chief Complaint Chief Complaint  Patient presents with  . Anxiety  . Suicidal    HPI Denise Ellis is a 36 y.o. female.  36 year old female with a history of Crohn's disease, chronic pain, COPD, anxiety, and PTSD presents to the emergency department for worsening anxiety. She states that she was taken off of her Ativan at the end of September by her doctor. She states that they didn't want her to be on both narcotic pain medication and benzos. She reports taking Ativan, 1 mg twice a day, for the past 10 years. She has been seen numerous times at [facility] over the past week for similar complaints of worsening anxiety. She denies any suicidal or homicidal thoughts. She is very agitated. Patient reports going to [facility] today, but is unable to see a physician for 2 weeks.  She is on chronic Percocet and Morphine     Past Medical History:  Diagnosis Date  . Anxiety   . Chronic back pain    Do NOT give narcotic RX!  CALLArlee Muslim, [phone], her pain specialist before giving any additional prescriptions  . Chronic leg pain    right leg "I don't have much feeling in it"  . COPD (chronic obstructive pulmonary disease) (HCC)    possible.    . Crohn's disease (HCC)   . DDD (degenerative disc disease)   . Depression    refused psychiatry, counseling, or any medications other than benzos  . Stroke [facility])     Patient Active Problem List   Diagnosis Date Noted  . Lumbar degenerative disc disease 05/25/2014  . Chronic pain associated with significant psychosocial dysfunction 05/25/2014  . Fall 01/31/2014  . Right leg numbness 01/31/2014  . Obesity 01/31/2014  . COPD (chronic obstructive pulmonary disease) (HCC)   . Chronic leg pain   . Chronic back pain   . Depression   . Anxiety   . Neuralgia neuritis, sciatic nerve 08/25/2012    Past Surgical History:  Procedure  Laterality Date  . APPENDECTOMY      OB History    No data available       Home Medications    Prior to Admission medications   Medication Sig Start Date End Date Taking? Authorizing Provider  albuterol (PROVENTIL) (2.5 MG/3ML) 0.083% nebulizer solution Take 2.5 mg by nebulization 2 (two) times daily as needed for wheezing or shortness of breath.   Yes [provider]  PROAIR HFA 108 (90 BASE) MCG/ACT inhaler Inhale 1-2 puffs into the lungs every 4 (four) hours as needed for wheezing or shortness of breath.  07/21/14  Yes [provider]  clonazePAM (KLONOPIN) 2 MG tablet Take 2 mg by mouth 2 (two) times daily.     [provider]  Tiotropium Bromide Monohydrate (SPIRIVA RESPIMAT) 2.5 MCG/ACT AERS Inhale 2 puffs into the lungs daily. Reported on 10/25/2015    [provider]    Family History Family History  Problem Relation Age of Onset  . Ovarian cancer Mother   . Heart disease Father   . Diabetes Mellitus I Father     Social History Social History  Substance Use Topics  . Smoking status: Former Smoker    Packs/day: 0.20    Years: 18.00    Types: Cigarettes    Quit date: 01/31/2009  . Smokeless tobacco: Never Used  . Alcohol use No  Allergies   Gabapentin; Ibuprofen; Methocarbamol; Naproxen; Meloxicam; and Gadolinium derivatives   Review of Systems Review of Systems Ten systems reviewed and are negative for acute change, except as noted in the HPI.    Physical Exam Updated Vital Signs BP 131/84 (BP Location: Right Arm)   Pulse 89   Temp 98.4 F (36.9 C) (Oral)   Resp 18   SpO2 100%   Physical Exam  Constitutional: She is oriented to person, place, and time. She appears well-developed and well-nourished. No distress.  HENT:  Head: Normocephalic and atraumatic.  Eyes: Conjunctivae and EOM are normal. No scleral icterus.  Neck: Normal range of motion.  Pulmonary/Chest: Effort normal. No respiratory distress.    Musculoskeletal: Normal range of motion.  Neurological: She is alert and oriented to person, place, and time.  Skin: Skin is warm and dry. No rash noted. She is not diaphoretic. No erythema. No pallor.  Psychiatric: Her speech is normal. Her mood appears anxious. Her affect is angry. She is agitated. She expresses no homicidal and no suicidal ideation. She expresses no suicidal plans and no homicidal plans.  Nursing note and vitals reviewed.    ED Treatments / Results  Labs (all labs ordered are listed, but only abnormal results are displayed) Labs Reviewed  COMPREHENSIVE METABOLIC PANEL - Abnormal; Notable for the following:       Result Value   Total Bilirubin 0.1 (*)    All other components within normal limits  CBC - Abnormal; Notable for the following:    WBC 12.8 (*)    All other components within normal limits  RAPID URINE DRUG SCREEN, HOSP PERFORMED - Abnormal; Notable for the following:    Benzodiazepines POSITIVE (*)    All other components within normal limits  ETHANOL  PREGNANCY, URINE    EKG  EKG Interpretation None       Radiology No results found.  Procedures Procedures (including critical care time)  Medications Ordered in ED Medications  acetaminophen (TYLENOL) tablet 650 mg (not administered)  ondansetron (ZOFRAN) tablet 4 mg (not administered)  nicotine (NICODERM CQ - dosed in mg/24 hours) patch 21 mg (not administered)  LORazepam (ATIVAN) tablet 1 mg (1 mg Oral Given 01/31/17 2332)     Initial Impression / Assessment and Plan / ED Course  I have reviewed the triage vital signs and the nursing notes.  Pertinent labs & imaging results that were available during my care of the patient were reviewed by me and considered in my medical decision making (see chart for details).     Patient presents for worsening anxiety. She states that her doctor took her off her Ativan at the end of September as they no longer wished to prescribe it with her pain  medication. Patient has been seen numerous times at [facility] for complaints of worsening anxiety. She is agitated and blunt during my assessment with her. No specific SI or HI expressed. The patient has been medically cleared and evaluated by TTS. They recommend psychiatric evaluation in the morning. Disposition to be determined by oncoming ED provider.   Final Clinical Impressions(s) / ED Diagnoses   Final diagnoses:  Anxiety disorder, unspecified type    New Prescriptions New Prescriptions   No medications on file     Antony Madura, Cordelia Poche 02/01/17 0444    Linwood Dibbles, MD 02/01/17 1214

## 2017-02-01 DIAGNOSIS — K5090 Crohn's disease, unspecified, without complications: Secondary | ICD-10-CM | POA: Diagnosis not present

## 2017-02-01 DIAGNOSIS — F411 Generalized anxiety disorder: Secondary | ICD-10-CM | POA: Diagnosis not present

## 2017-02-01 DIAGNOSIS — Z87891 Personal history of nicotine dependence: Secondary | ICD-10-CM

## 2017-02-01 DIAGNOSIS — F4310 Post-traumatic stress disorder, unspecified: Secondary | ICD-10-CM | POA: Diagnosis not present

## 2017-02-01 DIAGNOSIS — J449 Chronic obstructive pulmonary disease, unspecified: Secondary | ICD-10-CM

## 2017-02-01 DIAGNOSIS — F410 Panic disorder [episodic paroxysmal anxiety] without agoraphobia: Secondary | ICD-10-CM

## 2017-02-01 MED ORDER — HYDROXYZINE HCL 25 MG PO TABS
25.0000 mg | ORAL_TABLET | Freq: Once | ORAL | Status: AC
  Administered 2017-02-01: 25 mg via ORAL
  Filled 2017-02-01: qty 1

## 2017-02-01 MED ORDER — HYDROXYZINE HCL 25 MG PO TABS
25.0000 mg | ORAL_TABLET | Freq: Three times a day (TID) | ORAL | Status: DC
  Administered 2017-02-01: 25 mg via ORAL
  Filled 2017-02-01: qty 1

## 2017-02-01 MED ORDER — LORAZEPAM 1 MG PO TABS: 1.0000 mg | ORAL_TABLET | Freq: Two times a day (BID) | ORAL | Status: DC | PRN

## 2017-02-01 MED ORDER — HYDROXYZINE HCL 25 MG PO TABS: 25.0000 mg | ORAL_TABLET | Freq: Once | ORAL | Status: DC

## 2017-02-01 NOTE — Discharge Instructions (Signed)
For your behavioral health needs, you are advised to follow up with [facility].  Contact them at your earliest opportunity to as about enrolling in their program:   [facility]  [address] [place], [place] [address]  [phone]
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
## 2017-02-01 NOTE — [facility] Suicide Risk Assessment (Cosign Needed Addendum)
Suicide Risk Assessment  Discharge Assessment   [facility] Discharge Suicide Risk Assessment   Principal Problem: GAD (generalized anxiety disorder) Discharge Diagnoses:  Patient Active Problem List   Diagnosis Date Noted  . GAD (generalized anxiety disorder) [F41.1] 02/01/2017  . Lumbar degenerative disc disease [M51.36] 05/25/2014  . Chronic pain associated with significant psychosocial dysfunction [G89.4] 05/25/2014  . Fall [W19.XXXA] 01/31/2014  . Right leg numbness [R20.0] 01/31/2014  . Obesity [E66.9] 01/31/2014  . COPD (chronic obstructive pulmonary disease) (HCC) [J44.9]   . Chronic leg pain [M79.606, G89.29]   . Chronic back pain [M54.9, G89.29]   . Depression [F32.9]   . Anxiety [F41.9]   . Neuralgia neuritis, sciatic nerve [M54.30] 08/25/2012    Total Time spent with patient: 30 minutes  Musculoskeletal: Strength & Muscle Tone: within normal limits Gait & Station: normal Patient leans: N/A  Psychiatric Specialty Exam:   Blood pressure (!) 84/52, pulse 80, temperature 98 F (36.7 C), temperature source Oral, resp. rate 18, SpO2 94 %.There is no height or weight on file to calculate BMI.  General Appearance: Casual  Eye Contact::  Good  Speech:  Clear and Coherent  Volume:  Normal  Mood:  Anxious and Depressed  Affect:  Congruent  Thought Process:  Coherent  Orientation:  Full (Time, Place, and Person)  Thought Content:  Hallucinations: None  Suicidal Thoughts:  No  Homicidal Thoughts:  No  Memory:  Immediate;   Fair Recent;   Fair Remote;   Fair  Judgement:  Fair  Insight:  Fair  Psychomotor Activity:  Normal  Concentration:  Fair  Recall:  [employer] of Knowledge:Fair  Language: Good  Akathisia:  No  Handed:  Right  AIMS (if indicated):     Assets:  Communication Skills Desire for Improvement Resilience Social Support  Sleep:     Cognition: WNL  ADL's:  Intact   Mental Status Per Nursing Assessment::   On Admission:     Demographic Factors:   Caucasian  Loss Factors: NA  Historical Factors: NA  Risk Reduction Factors:   Positive social support and Positive coping skills or problem solving skills  Continued Clinical Symptoms:  Panic Attacks Alcohol/Substance Abuse/Dependencies  Cognitive Features That Contribute To Risk:  Closed-mindedness    Suicide Risk:  Minimal: No identifiable suicidal ideation.  Patients presenting with no risk factors but with morbid ruminations; may be classified as minimal risk based on the severity of the depressive symptoms    Plan Of Care/Follow-up recommendations:  Activity:   as tolerated Diet:  heart healthy  Oneta Rack, NP 02/01/2017, 1:17 PM

## 2017-02-01 NOTE — ED Notes (Signed)
Dr. Kohut at bedside 

## 2017-02-01 NOTE — ED Notes (Signed)
TTS assessment in progress via tele psych. 

## 2017-02-01 NOTE — ED Notes (Addendum)
Patient states "  I feel horrible and I am having an panic attack. My heart is racing, I also feel like my throat is closing up.VSS Notified Dr. Juleen China. New orders noted.

## 2017-02-01 NOTE — ED Notes (Signed)
Informed patient that Dr. Lenna Gilford an additional Atarax patient refuses and questions if she can be discharged. Notified Dr. Juleen China.

## 2017-02-01 NOTE — BH Assessment (Signed)
[facility] Assessment Progress Note  Per Thedore Mins, MD, this pt does not require psychiatric hospitalization at this time.  Pt is to be discharged from [facility] with referral information for [facility] in [facility].  This has been included in pt's discharge instructions.  Pt's nurse has been notified.  Doylene Canning, MA Triage Specialist [phone]
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
## 2017-02-01 NOTE — ED Notes (Signed)
Pt stated "that medicine made me feel worse.  It made me jittery inside."

## 2017-07-05 ENCOUNTER — Other Ambulatory Visit: Payer: Self-pay | Admitting: Family Medicine

## 2017-07-05 DIAGNOSIS — Z1231 Encounter for screening mammogram for malignant neoplasm of breast: Secondary | ICD-10-CM

## 2017-10-01 ENCOUNTER — Emergency Department (HOSPITAL_COMMUNITY): Payer: Medicare Other

## 2017-10-01 ENCOUNTER — Emergency Department (HOSPITAL_COMMUNITY)
Admission: EM | Admit: 2017-10-01 | Discharge: 2017-10-01 | Disposition: A | Discharge status: Home / Self Care | Payer: Medicare Other | Attending: Emergency Medicine | Admitting: Emergency Medicine

## 2017-10-01 ENCOUNTER — Encounter (HOSPITAL_COMMUNITY): Payer: Self-pay | Admitting: Emergency Medicine

## 2017-10-01 DIAGNOSIS — Z8673 Personal history of transient ischemic attack (TIA), and cerebral infarction without residual deficits: Secondary | ICD-10-CM | POA: Diagnosis not present

## 2017-10-01 DIAGNOSIS — R51 Headache: Secondary | ICD-10-CM | POA: Diagnosis not present

## 2017-10-01 DIAGNOSIS — J449 Chronic obstructive pulmonary disease, unspecified: Secondary | ICD-10-CM | POA: Diagnosis not present

## 2017-10-01 DIAGNOSIS — Z87891 Personal history of nicotine dependence: Secondary | ICD-10-CM | POA: Diagnosis not present

## 2017-10-01 DIAGNOSIS — Z79899 Other long term (current) drug therapy: Secondary | ICD-10-CM | POA: Diagnosis not present

## 2017-10-01 DIAGNOSIS — R42 Dizziness and giddiness: Secondary | ICD-10-CM | POA: Insufficient documentation

## 2017-10-01 LAB — URINALYSIS, ROUTINE W REFLEX MICROSCOPIC
Bilirubin Urine: NEGATIVE
Glucose, UA: NEGATIVE mg/dL
Ketones, ur: NEGATIVE mg/dL
LEUKOCYTES UA: NEGATIVE
NITRITE: NEGATIVE
Protein, ur: 30 mg/dL — AB
SPECIFIC GRAVITY, URINE: 1.029 (ref 1.005–1.030)
pH: 5 (ref 5.0–8.0)

## 2017-10-01 LAB — APTT: APTT: 32 s (ref 24–36)

## 2017-10-01 LAB — COMPREHENSIVE METABOLIC PANEL
ALT: 14 U/L (ref 14–54)
ANION GAP: 13 (ref 5–15)
AST: 16 U/L (ref 15–41)
Albumin: 3.8 g/dL (ref 3.5–5.0)
Alkaline Phosphatase: 90 U/L (ref 38–126)
BUN: 7 mg/dL (ref 6–20)
CHLORIDE: 106 mmol/L (ref 101–111)
CO2: 20 mmol/L — AB (ref 22–32)
CREATININE: 0.67 mg/dL (ref 0.44–1.00)
Calcium: 9.2 mg/dL (ref 8.9–10.3)
Glucose, Bld: 78 mg/dL (ref 65–99)
POTASSIUM: 3.9 mmol/L (ref 3.5–5.1)
SODIUM: 139 mmol/L (ref 135–145)
Total Bilirubin: 0.4 mg/dL (ref 0.3–1.2)
Total Protein: 7.3 g/dL (ref 6.5–8.1)

## 2017-10-01 LAB — I-STAT CHEM 8, ED
BUN: 10 mg/dL (ref 6–20)
CALCIUM ION: 0.98 mmol/L — AB (ref 1.15–1.40)
CHLORIDE: 107 mmol/L (ref 101–111)
Creatinine, Ser: 0.6 mg/dL (ref 0.44–1.00)
GLUCOSE: 78 mg/dL (ref 65–99)
HCT: 43 % (ref 36.0–46.0)
HEMOGLOBIN: 14.6 g/dL (ref 12.0–15.0)
POTASSIUM: 3.8 mmol/L (ref 3.5–5.1)
SODIUM: 138 mmol/L (ref 135–145)
TCO2: 22 mmol/L (ref 22–32)

## 2017-10-01 LAB — PROTIME-INR
INR: 1.05
PROTHROMBIN TIME: 13.6 s (ref 11.4–15.2)

## 2017-10-01 LAB — I-STAT TROPONIN, ED: TROPONIN I, POC: 0 ng/mL (ref 0.00–0.08)

## 2017-10-01 LAB — I-STAT BETA HCG BLOOD, ED (MC, WL, AP ONLY)

## 2017-10-01 MED ORDER — DIAZEPAM 5 MG/ML IJ SOLN
5.0000 mg | Freq: Once | INTRAMUSCULAR | Status: AC
  Administered 2017-10-01: 5 mg via INTRAVENOUS
  Filled 2017-10-01: qty 2

## 2017-10-01 MED ORDER — METOCLOPRAMIDE HCL 5 MG/ML IJ SOLN
10.0000 mg | Freq: Once | INTRAMUSCULAR | Status: AC
  Administered 2017-10-01: 10 mg via INTRAVENOUS
  Filled 2017-10-01: qty 2

## 2017-10-01 MED ORDER — GADOBENATE DIMEGLUMINE 529 MG/ML IV SOLN
15.0000 mL | Freq: Once | INTRAVENOUS | Status: AC
  Administered 2017-10-01: 20 mL via INTRAVENOUS

## 2017-10-01 NOTE — ED Notes (Signed)
To MRI

## 2017-10-01 NOTE — ED Notes (Signed)
Pt requesting more Valium

## 2017-10-01 NOTE — ED Notes (Signed)
Called Pt for vitals no answer. 

## 2017-10-01 NOTE — ED Notes (Signed)
Back from MRI.

## 2017-10-01 NOTE — ED Provider Notes (Signed)
[facility] EMERGENCY DEPARTMENT Provider Note   CSN: [id number] Arrival date & time: 10/01/17  0055     History   Chief Complaint Chief Complaint  Patient presents with  . Headache    HPI Denise Ellis is a 37 y.o. female.  The history is provided by the patient and medical records. No language interpreter was used.    Denise Ellis is a 37 y.o. female  with a PMH of chronic pain, anxiety, prior stroke without sequela in 2011 (per chart review, multiple negative MRIs since that time) who presents to the Emergency Department complaining of persistent, constant dizziness for the last week.  She describes this as ""I feel drunk". When she looks at things around the room, objects appear to be swaying. Associated with n/v ast least 1 episode of emesis daily.  She also reports associated headache over the last several days, bilateral in both to the front and back of the head.  She reports insomnia due to headache as well.  She gets worse before bed.  She was seen in the [facility] emergency department on 6/05 x2 as well as yesterday.  Symptomatic treatment seem to fail as she had no improvement and continue to report back to emergency department.  They felt as if neurology consultation was warranted and requested transfer to higher level of care which led to her ER visit at our facility today.  No fever or chills.  Denies numbness or weakness.  Past Medical History:  Diagnosis Date  . Anxiety   . Chronic back pain    Do NOT give narcotic RX!  CALLArlee Muslim, [phone], her pain specialist before giving any additional prescriptions  . Chronic leg pain    right leg "I don't have much feeling in it"  . COPD (chronic obstructive pulmonary disease) (HCC)    possible.    . Crohn's disease (HCC)   . DDD (degenerative disc disease)   . Depression    refused psychiatry, counseling, or any medications other than benzos  . Stroke [facility])     Patient Active  Problem List   Diagnosis Date Noted  . GAD (generalized anxiety disorder) 02/01/2017  . Lumbar degenerative disc disease 05/25/2014  . Chronic pain associated with significant psychosocial dysfunction 05/25/2014  . Fall 01/31/2014  . Right leg numbness 01/31/2014  . Obesity 01/31/2014  . COPD (chronic obstructive pulmonary disease) (HCC)   . Chronic leg pain   . Chronic back pain   . Depression   . Anxiety   . Neuralgia neuritis, sciatic nerve 08/25/2012    Past Surgical History:  Procedure Laterality Date  . APPENDECTOMY       OB History   None      Home Medications    Prior to Admission medications   Medication Sig Start Date End Date Taking? Authorizing Provider  LORazepam (ATIVAN) 1 MG tablet Take 1 mg by mouth 3 (three) times daily as needed for anxiety. 09/26/17  Yes [provider]  oxyCODONE-acetaminophen (PERCOCET/ROXICET) 5-325 MG tablet Take 1 tablet by mouth every 8 (eight) hours as needed for pain. for pain 09/10/17  Yes [provider]  QUEtiapine (SEROQUEL) 100 MG tablet Take 100 mg by mouth at bedtime. 09/19/17  Yes [provider]  traMADol (ULTRAM-ER) 300 MG 24 hr tablet Take 300 mg by mouth daily as needed for pain. 09/23/17  Yes [provider]    Family History Family History  Problem Relation Age of Onset  .  Ovarian cancer Mother   . Heart disease Father   . Diabetes Mellitus I Father     Social History Social History   Tobacco Use  . Smoking status: Former Smoker    Packs/day: 0.20    Years: 18.00    Pack years: 3.60    Types: Cigarettes    Last attempt to quit: 01/31/2009    Years since quitting: 8.6  . Smokeless tobacco: Never Used  Substance Use Topics  . Alcohol use: No  . Drug use: Not on file     Allergies   Gabapentin; Ibuprofen; Methocarbamol; Naproxen; Meloxicam; and Gadolinium derivatives   Review of Systems Review of Systems  Neurological: Positive for dizziness and headaches.  All  other systems reviewed and are negative.    Physical Exam Updated Vital Signs BP 111/63 (BP Location: Right Arm)   Pulse 60   Temp 98 F (36.7 C) (Oral)   Resp 13   LMP 09/10/2017 (Within Days)   SpO2 100%   Physical Exam  Constitutional: She is oriented to person, place, and time. She appears well-developed and well-nourished. No distress.  HENT:  Head: Normocephalic and atraumatic.  Neck:  No midline tenderness. She does have tenderness to right paraspinal musculature. Full ROM. No meningeal signs.   Cardiovascular: Normal rate, regular rhythm and normal heart sounds.  No murmur heard. Pulmonary/Chest: Effort normal and breath sounds normal. No respiratory distress.  Abdominal: Soft. She exhibits no distension. There is no tenderness.  Musculoskeletal: Normal range of motion.  Neurological: She is alert and oriented to person, place, and time.  Alert, oriented, thought content appropriate, able to give a coherent history. Speech is clear and goal oriented, able to follow commands.  Cranial Nerves:  II:  Peripheral visual fields grossly normal, pupils equal, round, reactive to light III, IV, VI: EOM intact bilaterally, ptosis not present V,VII: smile symmetric, eyes kept closed tightly against resistance, facial light touch sensation equal VIII: hearing grossly normal IX, X: symmetric soft palate movement, uvula elevates symmetrically  XI: bilateral shoulder shrug symmetric and strong XII: midline tongue extension 5/5 muscle strength in upper and lower extremities bilaterally including strong and equal grip strength and dorsiflexion/plantar flexion Sensory to light touch normal in all four extremities.  Normal finger-to-nose and rapid alternating movements; No drift.  Skin: Skin is warm and dry.  Nursing note and vitals reviewed.    ED Treatments / Results  Labs (all labs ordered are listed, but only abnormal results are displayed) Labs Reviewed  COMPREHENSIVE  METABOLIC PANEL - Abnormal; Notable for the following components:      Result Value   CO2 20 (*)    All other components within normal limits  URINALYSIS, ROUTINE W REFLEX MICROSCOPIC - Abnormal; Notable for the following components:   Color, Urine AMBER (*)    APPearance TURBID (*)    Hgb urine dipstick SMALL (*)    Protein, ur 30 (*)    Bacteria, UA FEW (*)    All other components within normal limits  I-STAT CHEM 8, ED - Abnormal; Notable for the following components:   Calcium, Ion 0.98 (*)    All other components within normal limits  PROTIME-INR  APTT  CBC WITH DIFFERENTIAL/PLATELET  I-STAT TROPONIN, ED  I-STAT BETA HCG BLOOD, ED ([facility], [facility], [facility] ONLY)    EKG None  Radiology Ct Head Wo Contrast  Result Date: 10/01/2017 CLINICAL DATA:  Frontal headache with nausea, vomiting and dizziness. Left arm numbness and tingling. EXAM: CT  HEAD WITHOUT CONTRAST TECHNIQUE: Contiguous axial images were obtained from the base of the skull through the vertex without intravenous contrast. COMPARISON:  Head CT and brain MRI 03/11/2014 FINDINGS: Brain: No intracranial hemorrhage, mass effect, or midline shift. No hydrocephalus. The basilar cisterns are patent. No evidence of territorial infarct or acute ischemia. No extra-axial or intracranial fluid collection. Vascular: No hyperdense vessel or unexpected calcification. Skull: No fracture or focal lesion. Sinuses/Orbits: No fracture or focal lesion. Other: Paranasal sinuses and mastoid air cells are clear. The visualized orbits are unremarkable. IMPRESSION: Negative noncontrast head CT. Electronically Signed   By: Rubye Oaks M.D.   On: 10/01/2017 03:18   Mr Brain W And Wo Contrast  Result Date: 10/01/2017 CLINICAL DATA:  Dizziness for 1 week. Left arm numbness and tingling. Headache. EXAM: MRI HEAD WITHOUT AND WITH CONTRAST TECHNIQUE: Multiplanar, multiecho pulse sequences of the brain and surrounding structures were obtained without and with  intravenous contrast. CONTRAST:  20mL MULTIHANCE GADOBENATE DIMEGLUMINE 529 MG/ML IV SOLN COMPARISON:  Head CT 10/01/2017 and MRI 03/11/2014 FINDINGS: Brain: There is no evidence of acute infarct, intracranial hemorrhage, mass, midline shift, or extra-axial fluid collection. The ventricles and sulci are normal. The brain is normal in signal. No abnormal enhancement is identified. Limited assessment of the seventh and eighth cranial nerve complexes on this nondedicated study is unremarkable. Vascular: Major intracranial vascular flow voids are preserved. Skull and upper cervical spine: Unremarkable bone marrow signal. Sinuses/Orbits: Unremarkable orbits. Mild right anterior ethmoid air cell mucosal thickening. Right inferior nasal turbinate edema. Clear mastoid air cells. Other: None. IMPRESSION: Unremarkable appearance of the brain. Electronically Signed   By: Sebastian Ache M.D.   On: 10/01/2017 14:19    Procedures Procedures (including critical care time)  Medications Ordered in ED Medications  metoCLOPramide (REGLAN) injection 10 mg (10 mg Intravenous Given 10/01/17 1220)  diazepam (VALIUM) injection 5 mg (5 mg Intravenous Given 10/01/17 1252)  gadobenate dimeglumine (MULTIHANCE) injection 15 mL (20 mLs Intravenous Contrast Given 10/01/17 1413)     Initial Impression / Assessment and Plan / ED Course  I have reviewed the triage vital signs and the nursing notes.  Pertinent labs & imaging results that were available during my care of the patient were reviewed by me and considered in my medical decision making (see chart for details).     BRENLEY PRIORE is a 37 y.o. female who presents to ED in transfer from outside emergency department for refractory dizziness.  She has been complaining of dizziness for the last week and seen in outside ER 3 times for same.  No focal neuro deficits on exam. Labs reassuring. CT head negative.  Case with oncoming neurology, Dr. Wilford Corner, who recommends obtaining MR  brain with and without.  If negative, no further neurology work-up needed.  MR obtained and unremarkable.  Patient has ambulated in the ED with steady gait.  She reports her doctor is scheduling her for physical therapy for her ongoing dizziness.  Encouraged this and she will continue to follow-up with them as outpatient.  Reasons to return to ER discussed and all questions answered.   Final Clinical Impressions(s) / ED Diagnoses   Final diagnoses:  Dizziness    ED Discharge Orders    None       Ward, Chase Picket, PA-C 10/01/17 1525    Charlynne Pander, MD 10/06/17 [phone]

## 2017-10-01 NOTE — Discharge Instructions (Signed)
It was my pleasure taking care of you today!  Fortunately, your MRI today was negative.   Please follow-up with your doctor.   Return to ER for new or worsening symptoms, any additional concerns.

## 2017-10-01 NOTE — ED Triage Notes (Signed)
Pt sent POV tx from [facility] for stroke R/O. Pt has had HA, dizziness, L arm tingling/numbness X1 week. Dr Bebe Shaggy is aware pt has arrived, needs to be evaluated first before MRI ordered.

## 2017-10-01 NOTE — ED Notes (Signed)
Per lab, CBC tube has clotted, will need new lavender top tube drawn

## 2017-10-01 NOTE — ED Notes (Signed)
Pt up to ambulate w/o diff

## 2017-10-26 ENCOUNTER — Encounter

## 2019-09-17 ENCOUNTER — Ambulatory Visit: Payer: Medicare Other | Admitting: Sports Medicine

## 2019-09-17 ENCOUNTER — Other Ambulatory Visit: Payer: Self-pay

## 2019-10-21 ENCOUNTER — Other Ambulatory Visit: Payer: Self-pay

## 2019-10-21 ENCOUNTER — Ambulatory Visit (INDEPENDENT_AMBULATORY_CARE_PROVIDER_SITE_OTHER): Payer: Medicare Other | Admitting: Plastic Surgery

## 2019-10-21 ENCOUNTER — Encounter: Payer: Self-pay | Admitting: Plastic Surgery

## 2019-10-21 VITALS — BP 107/77 | HR 83 | Temp 98.4°F | Ht 66.0 in | Wt 258.0 lb

## 2019-10-21 DIAGNOSIS — M545 Low back pain, unspecified: Secondary | ICD-10-CM

## 2019-10-21 DIAGNOSIS — M4004 Postural kyphosis, thoracic region: Secondary | ICD-10-CM

## 2019-10-21 DIAGNOSIS — N62 Hypertrophy of breast: Secondary | ICD-10-CM

## 2019-10-21 DIAGNOSIS — M546 Pain in thoracic spine: Secondary | ICD-10-CM | POA: Diagnosis not present

## 2019-10-21 NOTE — Progress Notes (Signed)
Referring Provider Marin Comment, FNP [address] [place],  [place] [address]   CC:  Chief Complaint  Patient presents with  . Consult      Denise Ellis is an 39 y.o. female.  HPI: Patient presents to discuss breast reduction.  She is had years of back pain, neck pain, shoulder grooving and rashes beneath her breast.  She tried over-the-counter medications, warm packs, cold packs, over-the-counter powders with no relief.  She has been to a chiropractor and a physical therapist in the past with little relief.  She is currently double D and wants to be around a size C.  She did have a maternal grandmother with breast cancer.  She has not had any other previous breast procedures or biopsies..  She has not yet had a mammogram.  She does smoke cigarettes.  She is nondiabetic.  Allergies  Allergen Reactions  . Gabapentin Anaphylaxis  . Codeine Other (See Comments)  . Ibuprofen Nausea And Vomiting and Hives  . Methocarbamol Nausea And Vomiting  . Naproxen Hives and Nausea And Vomiting  . Meloxicam Hives  . Gadolinium Derivatives Nausea And Vomiting    Outpatient Encounter Medications as of 10/21/2019  Medication Sig Note  . albuterol (PROVENTIL) (2.5 MG/3ML) 0.083% nebulizer solution 2.5 mg.   . clindamycin (CLEOCIN) 300 MG capsule Take 300 mg by mouth 3 (three) times daily.   Marland Kitchen LORazepam (ATIVAN) 1 MG tablet Take 1 mg by mouth 3 (three) times daily as needed for anxiety.   Marland Kitchen nystatin (MYCOSTATIN/NYSTOP) powder    . oxyCODONE-acetaminophen (PERCOCET/ROXICET) 5-325 MG tablet Take 1 tablet by mouth every 8 (eight) hours as needed for pain. for pain 10/01/2017: LF on 09-10-17 # 90 30DS @ [facility]  . QUEtiapine (SEROQUEL) 100 MG tablet Take 100 mg by mouth at bedtime.   . traMADol (ULTRAM-ER) 300 MG 24 hr tablet Take 300 mg by mouth daily as needed for pain. 10/01/2017: LF on 09-23-17 #30 @ [facility] 30DS  . TraMADol HCl 200 MG TB24 Take 1 tablet by  mouth daily as needed.   . valACYclovir (VALTREX) 1000 MG tablet SMARTSIG:2 Tablet(s) By Mouth Every 12 Hours (Patient not taking: Reported on 10/21/2019)    No facility-administered encounter medications on file as of 10/21/2019.     Past Medical History:  Diagnosis Date  . Anxiety   . Chronic back pain    Do NOT give narcotic RX!  CALLArlee Muslim, [phone], her pain specialist before giving any additional prescriptions  . Chronic leg pain    right leg "I don't have much feeling in it"  . COPD (chronic obstructive pulmonary disease) (HCC)    possible.    . Crohn's disease (HCC)   . DDD (degenerative disc disease)   . Depression    refused psychiatry, counseling, or any medications other than benzos  . Stroke [facility])     Past Surgical History:  Procedure Laterality Date  . APPENDECTOMY      Family History  Problem Relation Age of Onset  . Ovarian cancer Mother   . Heart disease Father   . Diabetes Mellitus I Father     Social History   Social History Narrative   ** Merged History Encounter **         Review of Systems General: Denies fevers, chills, weight loss CV: Denies chest pain, shortness of breath, palpitations  Physical Exam Vitals with BMI 10/21/2019 10/01/2017 10/01/2017  Height 5\' 6"  - -  Weight 258 lbs - -  BMI 41.66 - -  Systolic 107 83 111  Diastolic 77 45 63  Pulse 83 66 -    General:  No acute distress,  Alert and oriented, Non-Toxic, Normal speech and affect Breast: She has grade 3 ptosis.  Sternal notch to nipple is 37 cm on the right and 33 cm on the left.  Nipple to fold is 17 cm on the right and 16 cm on the left.  I do not see any obvious scars or masses.  She is quite a bit bigger on the right side compared to the left.  Assessment/Plan The patient has bilateral symptomatic macromastia.  She is a good candidate for a breast reduction.  She is interested in pursuing surgical treatment.  The details of breast reduction surgery were  discussed.  I explained the procedure in detail along the with the expected scars.  The risks were discussed in detail and include bleeding, infection, damage to surrounding structures, need for additional procedures, nipple loss, change in nipple sensation, persistent pain, contour irregularities and asymmetries.  I explained that breast feeding is often not possible after breast reduction surgery.  We discussed the expected postoperative course with an overall recovery period of about 1 month.  She demonstrated full understanding of all risks.  We discussed her personal risk factors that include cigarette smoking.  I explained that cigarette smoking or other forms of nicotine use would place her at a much higher risk of complications in my opinion if she continued it she would not be a candidate for the operation.  She says she can quit we will plan to test her for nicotine and cotton levels a week or 2 prior to surgery.  I was clear to explained that the benefits of quitting cigarette smoking are most significant if she quits 4 to 6 weeks ahead of time.  I also explained that she needed to continue to stop smoking after the procedure for her risk with then go back up.  I anticipate approximately 900 g of tissue removed from the right.  And 750 g removed from the left.   Allena Napoleon 10/21/2019, 3:17 PM

## 2019-12-14 ENCOUNTER — Encounter: Payer: Medicare Other | Admitting: Obstetrics and Gynecology

## 2020-01-05 ENCOUNTER — Telehealth: Payer: Self-pay | Admitting: Plastic Surgery
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
## 2020-01-06 ENCOUNTER — Telehealth: Payer: Self-pay | Admitting: Plastic Surgery

## 2020-01-06 NOTE — Telephone Encounter (Signed)
Denise Ellis left a voicemail saying she would like to cancel her breast reduction surgery on Nov 2nd.  She said that she doesn't feel comfortable coming all the way to [facility] and having to find a ride. Hollie Beach called back to confirm the message and the patient told Selena Batten that "she can't go 6-7 weeks without smoking."  Everything has been canceled, per patient request.

## 2020-01-07 ENCOUNTER — Encounter: Payer: Medicare Other | Admitting: Surgical

## 2020-01-22 ENCOUNTER — Other Ambulatory Visit (HOSPITAL_COMMUNITY): Payer: Medicare Other

## 2020-01-26 ENCOUNTER — Ambulatory Visit (HOSPITAL_BASED_OUTPATIENT_CLINIC_OR_DEPARTMENT_OTHER): Admit: 2020-01-26 | Payer: Medicare Other | Admitting: Plastic Surgery

## 2020-01-26 ENCOUNTER — Encounter (HOSPITAL_BASED_OUTPATIENT_CLINIC_OR_DEPARTMENT_OTHER): Payer: Self-pay

## 2020-01-26 SURGERY — MAMMOPLASTY, REDUCTION
Anesthesia: General | Site: Breast | Laterality: Bilateral

## 2020-02-03 ENCOUNTER — Encounter: Payer: Medicare Other | Admitting: Plastic Surgery

## 2020-02-10 ENCOUNTER — Encounter: Payer: Medicare Other | Admitting: Plastic Surgery

## 2020-02-20 ENCOUNTER — Other Ambulatory Visit (HOSPITAL_COMMUNITY): Payer: Medicare Other

## 2020-03-02 ENCOUNTER — Encounter: Payer: Medicare Other | Admitting: Plastic Surgery

## 2020-03-09 ENCOUNTER — Encounter: Payer: Medicare Other | Admitting: Plastic Surgery

## 2020-05-11 DIAGNOSIS — N201 Calculus of ureter: Secondary | ICD-10-CM | POA: Insufficient documentation

## 2020-05-11 HISTORY — DX: Calculus of ureter: N20.1

## 2020-07-23 DIAGNOSIS — J449 Chronic obstructive pulmonary disease, unspecified: Secondary | ICD-10-CM | POA: Diagnosis not present

## 2020-07-23 DIAGNOSIS — Z981 Arthrodesis status: Secondary | ICD-10-CM | POA: Diagnosis not present

## 2020-07-23 DIAGNOSIS — M62838 Other muscle spasm: Secondary | ICD-10-CM | POA: Diagnosis not present

## 2020-07-23 DIAGNOSIS — K5090 Crohn's disease, unspecified, without complications: Secondary | ICD-10-CM | POA: Diagnosis not present

## 2020-07-23 DIAGNOSIS — M5126 Other intervertebral disc displacement, lumbar region: Secondary | ICD-10-CM | POA: Diagnosis not present

## 2020-07-23 DIAGNOSIS — M797 Fibromyalgia: Secondary | ICD-10-CM | POA: Diagnosis not present

## 2020-07-23 DIAGNOSIS — Q7649 Other congenital malformations of spine, not associated with scoliosis: Secondary | ICD-10-CM | POA: Diagnosis not present

## 2020-07-23 DIAGNOSIS — R519 Headache, unspecified: Secondary | ICD-10-CM | POA: Diagnosis not present

## 2020-07-23 DIAGNOSIS — M47812 Spondylosis without myelopathy or radiculopathy, cervical region: Secondary | ICD-10-CM | POA: Diagnosis not present

## 2020-07-23 DIAGNOSIS — Z8673 Personal history of transient ischemic attack (TIA), and cerebral infarction without residual deficits: Secondary | ICD-10-CM | POA: Diagnosis not present

## 2020-07-23 DIAGNOSIS — M19011 Primary osteoarthritis, right shoulder: Secondary | ICD-10-CM | POA: Diagnosis not present

## 2020-07-23 DIAGNOSIS — Z87442 Personal history of urinary calculi: Secondary | ICD-10-CM | POA: Diagnosis not present

## 2020-07-23 DIAGNOSIS — M4326 Fusion of spine, lumbar region: Secondary | ICD-10-CM | POA: Diagnosis not present

## 2020-07-23 DIAGNOSIS — M549 Dorsalgia, unspecified: Secondary | ICD-10-CM | POA: Diagnosis not present

## 2020-07-23 DIAGNOSIS — R42 Dizziness and giddiness: Secondary | ICD-10-CM | POA: Diagnosis not present

## 2020-07-23 DIAGNOSIS — M542 Cervicalgia: Secondary | ICD-10-CM | POA: Diagnosis not present

## 2020-07-23 DIAGNOSIS — S161XXA Strain of muscle, fascia and tendon at neck level, initial encounter: Secondary | ICD-10-CM | POA: Diagnosis not present

## 2020-07-25 DIAGNOSIS — R61 Generalized hyperhidrosis: Secondary | ICD-10-CM | POA: Diagnosis not present

## 2020-07-25 DIAGNOSIS — Z8673 Personal history of transient ischemic attack (TIA), and cerebral infarction without residual deficits: Secondary | ICD-10-CM | POA: Diagnosis not present

## 2020-07-25 DIAGNOSIS — Z79899 Other long term (current) drug therapy: Secondary | ICD-10-CM | POA: Diagnosis not present

## 2020-07-25 DIAGNOSIS — R1110 Vomiting, unspecified: Secondary | ICD-10-CM | POA: Diagnosis not present

## 2020-07-25 DIAGNOSIS — R519 Headache, unspecified: Secondary | ICD-10-CM | POA: Diagnosis not present

## 2020-07-25 DIAGNOSIS — K5090 Crohn's disease, unspecified, without complications: Secondary | ICD-10-CM | POA: Diagnosis not present

## 2020-07-25 DIAGNOSIS — Z9981 Dependence on supplemental oxygen: Secondary | ICD-10-CM | POA: Diagnosis not present

## 2020-07-25 DIAGNOSIS — J449 Chronic obstructive pulmonary disease, unspecified: Secondary | ICD-10-CM | POA: Diagnosis not present

## 2020-07-25 DIAGNOSIS — F17210 Nicotine dependence, cigarettes, uncomplicated: Secondary | ICD-10-CM | POA: Diagnosis not present

## 2020-07-25 DIAGNOSIS — G8929 Other chronic pain: Secondary | ICD-10-CM | POA: Diagnosis not present

## 2020-07-25 DIAGNOSIS — M549 Dorsalgia, unspecified: Secondary | ICD-10-CM | POA: Diagnosis not present

## 2020-07-25 DIAGNOSIS — M797 Fibromyalgia: Secondary | ICD-10-CM | POA: Diagnosis not present

## 2020-08-03 DIAGNOSIS — G894 Chronic pain syndrome: Secondary | ICD-10-CM | POA: Diagnosis not present

## 2020-08-03 DIAGNOSIS — M5412 Radiculopathy, cervical region: Secondary | ICD-10-CM | POA: Diagnosis not present

## 2020-08-03 DIAGNOSIS — M5416 Radiculopathy, lumbar region: Secondary | ICD-10-CM | POA: Diagnosis not present

## 2020-08-03 DIAGNOSIS — Z79899 Other long term (current) drug therapy: Secondary | ICD-10-CM | POA: Diagnosis not present

## 2020-08-03 DIAGNOSIS — Z79891 Long term (current) use of opiate analgesic: Secondary | ICD-10-CM | POA: Diagnosis not present

## 2020-08-03 DIAGNOSIS — M5459 Other low back pain: Secondary | ICD-10-CM | POA: Diagnosis not present

## 2020-08-17 DIAGNOSIS — H9190 Unspecified hearing loss, unspecified ear: Secondary | ICD-10-CM | POA: Insufficient documentation

## 2020-08-19 DIAGNOSIS — Z87828 Personal history of other (healed) physical injury and trauma: Secondary | ICD-10-CM | POA: Diagnosis not present

## 2020-08-19 DIAGNOSIS — R601 Generalized edema: Secondary | ICD-10-CM | POA: Diagnosis not present

## 2020-08-19 DIAGNOSIS — S161XXA Strain of muscle, fascia and tendon at neck level, initial encounter: Secondary | ICD-10-CM | POA: Diagnosis not present

## 2020-08-19 DIAGNOSIS — S43402A Unspecified sprain of left shoulder joint, initial encounter: Secondary | ICD-10-CM | POA: Diagnosis not present

## 2020-08-19 DIAGNOSIS — Z79899 Other long term (current) drug therapy: Secondary | ICD-10-CM | POA: Diagnosis not present

## 2020-08-19 DIAGNOSIS — Z87820 Personal history of traumatic brain injury: Secondary | ICD-10-CM | POA: Diagnosis not present

## 2020-08-19 LAB — BASIC METABOLIC PANEL
BUN: 10 (ref 4–21)
CO2: 17 (ref 13–22)
Chloride: 104 (ref 99–108)
Creatinine: 0.8 (ref 0.5–1.1)
Glucose: 82
Potassium: 4.8 mEq/L (ref 3.5–5.1)
Sodium: 143 (ref 137–147)

## 2020-08-19 LAB — COMPREHENSIVE METABOLIC PANEL
Albumin: 4.2 (ref 3.5–5.0)
Calcium: 8.9 (ref 8.7–10.7)
Globulin: 2.4
eGFR: 95

## 2020-08-19 LAB — HEPATIC FUNCTION PANEL
ALT: 10 U/L (ref 7–35)
AST: 13 (ref 13–35)
Alkaline Phosphatase: 101 (ref 25–125)
Bilirubin, Total: 0.2

## 2020-08-20 DIAGNOSIS — J449 Chronic obstructive pulmonary disease, unspecified: Secondary | ICD-10-CM | POA: Diagnosis not present

## 2020-08-21 DIAGNOSIS — Z9049 Acquired absence of other specified parts of digestive tract: Secondary | ICD-10-CM | POA: Diagnosis not present

## 2020-08-21 DIAGNOSIS — Z8249 Family history of ischemic heart disease and other diseases of the circulatory system: Secondary | ICD-10-CM | POA: Diagnosis not present

## 2020-08-21 DIAGNOSIS — M542 Cervicalgia: Secondary | ICD-10-CM | POA: Diagnosis not present

## 2020-08-21 DIAGNOSIS — Z823 Family history of stroke: Secondary | ICD-10-CM | POA: Diagnosis not present

## 2020-08-21 DIAGNOSIS — Z8673 Personal history of transient ischemic attack (TIA), and cerebral infarction without residual deficits: Secondary | ICD-10-CM | POA: Diagnosis not present

## 2020-08-21 DIAGNOSIS — M79604 Pain in right leg: Secondary | ICD-10-CM | POA: Diagnosis not present

## 2020-08-21 DIAGNOSIS — F17210 Nicotine dependence, cigarettes, uncomplicated: Secondary | ICD-10-CM | POA: Diagnosis not present

## 2020-08-21 DIAGNOSIS — M5410 Radiculopathy, site unspecified: Secondary | ICD-10-CM | POA: Diagnosis not present

## 2020-08-21 DIAGNOSIS — M797 Fibromyalgia: Secondary | ICD-10-CM | POA: Diagnosis not present

## 2020-08-21 DIAGNOSIS — J449 Chronic obstructive pulmonary disease, unspecified: Secondary | ICD-10-CM | POA: Diagnosis not present

## 2020-08-21 DIAGNOSIS — Z87892 Personal history of anaphylaxis: Secondary | ICD-10-CM | POA: Diagnosis not present

## 2020-08-21 DIAGNOSIS — F32A Depression, unspecified: Secondary | ICD-10-CM | POA: Diagnosis not present

## 2020-08-21 DIAGNOSIS — M79602 Pain in left arm: Secondary | ICD-10-CM | POA: Diagnosis not present

## 2020-08-21 DIAGNOSIS — Z9981 Dependence on supplemental oxygen: Secondary | ICD-10-CM | POA: Diagnosis not present

## 2020-08-21 DIAGNOSIS — Z87442 Personal history of urinary calculi: Secondary | ICD-10-CM | POA: Diagnosis not present

## 2020-08-21 DIAGNOSIS — K5090 Crohn's disease, unspecified, without complications: Secondary | ICD-10-CM | POA: Diagnosis not present

## 2020-08-21 DIAGNOSIS — R600 Localized edema: Secondary | ICD-10-CM | POA: Diagnosis not present

## 2020-08-21 DIAGNOSIS — M79605 Pain in left leg: Secondary | ICD-10-CM | POA: Diagnosis not present

## 2020-08-21 DIAGNOSIS — R079 Chest pain, unspecified: Secondary | ICD-10-CM | POA: Diagnosis not present

## 2020-08-21 DIAGNOSIS — Z79899 Other long term (current) drug therapy: Secondary | ICD-10-CM | POA: Diagnosis not present

## 2020-08-31 ENCOUNTER — Ambulatory Visit: Payer: Medicare Other | Admitting: Sports Medicine

## 2020-08-31 DIAGNOSIS — N200 Calculus of kidney: Secondary | ICD-10-CM | POA: Insufficient documentation

## 2020-09-20 DIAGNOSIS — Z79891 Long term (current) use of opiate analgesic: Secondary | ICD-10-CM | POA: Diagnosis not present

## 2020-09-20 DIAGNOSIS — M5412 Radiculopathy, cervical region: Secondary | ICD-10-CM | POA: Diagnosis not present

## 2020-09-20 DIAGNOSIS — M5459 Other low back pain: Secondary | ICD-10-CM | POA: Diagnosis not present

## 2020-09-20 DIAGNOSIS — Z79899 Other long term (current) drug therapy: Secondary | ICD-10-CM | POA: Diagnosis not present

## 2020-09-20 DIAGNOSIS — G894 Chronic pain syndrome: Secondary | ICD-10-CM | POA: Diagnosis not present

## 2020-09-20 DIAGNOSIS — J449 Chronic obstructive pulmonary disease, unspecified: Secondary | ICD-10-CM | POA: Diagnosis not present

## 2020-09-20 DIAGNOSIS — M5416 Radiculopathy, lumbar region: Secondary | ICD-10-CM | POA: Diagnosis not present

## 2020-09-22 DIAGNOSIS — J449 Chronic obstructive pulmonary disease, unspecified: Secondary | ICD-10-CM | POA: Diagnosis not present

## 2020-09-22 DIAGNOSIS — Z79899 Other long term (current) drug therapy: Secondary | ICD-10-CM | POA: Diagnosis not present

## 2020-09-23 DIAGNOSIS — J449 Chronic obstructive pulmonary disease, unspecified: Secondary | ICD-10-CM | POA: Diagnosis not present

## 2020-10-12 DIAGNOSIS — Z79891 Long term (current) use of opiate analgesic: Secondary | ICD-10-CM | POA: Diagnosis not present

## 2020-10-12 DIAGNOSIS — M5416 Radiculopathy, lumbar region: Secondary | ICD-10-CM | POA: Diagnosis not present

## 2020-10-12 DIAGNOSIS — Z79899 Other long term (current) drug therapy: Secondary | ICD-10-CM | POA: Diagnosis not present

## 2020-10-12 DIAGNOSIS — M5459 Other low back pain: Secondary | ICD-10-CM | POA: Diagnosis not present

## 2020-10-12 DIAGNOSIS — G894 Chronic pain syndrome: Secondary | ICD-10-CM | POA: Diagnosis not present

## 2020-10-12 DIAGNOSIS — M5412 Radiculopathy, cervical region: Secondary | ICD-10-CM | POA: Diagnosis not present

## 2020-10-20 ENCOUNTER — Other Ambulatory Visit: Payer: Self-pay | Admitting: Physician Assistant

## 2020-10-20 DIAGNOSIS — M5416 Radiculopathy, lumbar region: Secondary | ICD-10-CM

## 2020-10-20 DIAGNOSIS — J449 Chronic obstructive pulmonary disease, unspecified: Secondary | ICD-10-CM | POA: Diagnosis not present

## 2020-10-23 DIAGNOSIS — J449 Chronic obstructive pulmonary disease, unspecified: Secondary | ICD-10-CM | POA: Diagnosis not present

## 2020-11-01 ENCOUNTER — Other Ambulatory Visit: Payer: Medicare Other

## 2020-11-01 ENCOUNTER — Ambulatory Visit: Payer: Medicare Other | Admitting: Sports Medicine

## 2020-11-07 DIAGNOSIS — Z79891 Long term (current) use of opiate analgesic: Secondary | ICD-10-CM | POA: Diagnosis not present

## 2020-11-07 DIAGNOSIS — M5412 Radiculopathy, cervical region: Secondary | ICD-10-CM | POA: Diagnosis not present

## 2020-11-07 DIAGNOSIS — G894 Chronic pain syndrome: Secondary | ICD-10-CM | POA: Diagnosis not present

## 2020-11-07 DIAGNOSIS — M5459 Other low back pain: Secondary | ICD-10-CM | POA: Diagnosis not present

## 2020-11-07 DIAGNOSIS — M5416 Radiculopathy, lumbar region: Secondary | ICD-10-CM | POA: Diagnosis not present

## 2020-11-07 DIAGNOSIS — Z79899 Other long term (current) drug therapy: Secondary | ICD-10-CM | POA: Diagnosis not present

## 2020-11-08 ENCOUNTER — Inpatient Hospital Stay: Admission: RE | Admit: 2020-11-08 | Payer: Medicare Other | Source: Ambulatory Visit

## 2020-11-10 ENCOUNTER — Encounter: Payer: Self-pay | Admitting: Obstetrics and Gynecology

## 2020-11-10 ENCOUNTER — Encounter: Payer: Medicare Other | Admitting: Obstetrics and Gynecology

## 2020-11-18 ENCOUNTER — Ambulatory Visit: Payer: Medicare Other | Admitting: Sports Medicine

## 2020-11-20 DIAGNOSIS — J449 Chronic obstructive pulmonary disease, unspecified: Secondary | ICD-10-CM | POA: Diagnosis not present

## 2020-11-23 DIAGNOSIS — J449 Chronic obstructive pulmonary disease, unspecified: Secondary | ICD-10-CM | POA: Diagnosis not present

## 2020-12-21 DIAGNOSIS — J449 Chronic obstructive pulmonary disease, unspecified: Secondary | ICD-10-CM | POA: Diagnosis not present

## 2020-12-22 DIAGNOSIS — Z79891 Long term (current) use of opiate analgesic: Secondary | ICD-10-CM | POA: Diagnosis not present

## 2020-12-22 DIAGNOSIS — M5416 Radiculopathy, lumbar region: Secondary | ICD-10-CM | POA: Diagnosis not present

## 2020-12-22 DIAGNOSIS — M5412 Radiculopathy, cervical region: Secondary | ICD-10-CM | POA: Diagnosis not present

## 2020-12-22 DIAGNOSIS — G894 Chronic pain syndrome: Secondary | ICD-10-CM | POA: Diagnosis not present

## 2020-12-22 DIAGNOSIS — Z79899 Other long term (current) drug therapy: Secondary | ICD-10-CM | POA: Diagnosis not present

## 2020-12-22 DIAGNOSIS — M25559 Pain in unspecified hip: Secondary | ICD-10-CM | POA: Diagnosis not present

## 2020-12-24 DIAGNOSIS — J449 Chronic obstructive pulmonary disease, unspecified: Secondary | ICD-10-CM | POA: Diagnosis not present

## 2021-01-17 DIAGNOSIS — R200 Anesthesia of skin: Secondary | ICD-10-CM | POA: Diagnosis not present

## 2021-01-17 DIAGNOSIS — M5412 Radiculopathy, cervical region: Secondary | ICD-10-CM | POA: Diagnosis not present

## 2021-01-19 DIAGNOSIS — M5412 Radiculopathy, cervical region: Secondary | ICD-10-CM | POA: Diagnosis not present

## 2021-01-19 DIAGNOSIS — G894 Chronic pain syndrome: Secondary | ICD-10-CM | POA: Diagnosis not present

## 2021-01-19 DIAGNOSIS — Z79899 Other long term (current) drug therapy: Secondary | ICD-10-CM | POA: Diagnosis not present

## 2021-01-19 DIAGNOSIS — Z79891 Long term (current) use of opiate analgesic: Secondary | ICD-10-CM | POA: Diagnosis not present

## 2021-01-19 DIAGNOSIS — M25559 Pain in unspecified hip: Secondary | ICD-10-CM | POA: Diagnosis not present

## 2021-01-19 DIAGNOSIS — M5416 Radiculopathy, lumbar region: Secondary | ICD-10-CM | POA: Diagnosis not present

## 2021-01-20 DIAGNOSIS — J449 Chronic obstructive pulmonary disease, unspecified: Secondary | ICD-10-CM | POA: Diagnosis not present

## 2021-01-20 DIAGNOSIS — Z79891 Long term (current) use of opiate analgesic: Secondary | ICD-10-CM | POA: Diagnosis not present

## 2021-01-23 DIAGNOSIS — J449 Chronic obstructive pulmonary disease, unspecified: Secondary | ICD-10-CM | POA: Diagnosis not present

## 2021-02-07 DIAGNOSIS — R200 Anesthesia of skin: Secondary | ICD-10-CM | POA: Diagnosis not present

## 2021-02-07 DIAGNOSIS — M79604 Pain in right leg: Secondary | ICD-10-CM | POA: Diagnosis not present

## 2021-02-07 DIAGNOSIS — M545 Low back pain, unspecified: Secondary | ICD-10-CM | POA: Diagnosis not present

## 2021-02-13 DIAGNOSIS — J449 Chronic obstructive pulmonary disease, unspecified: Secondary | ICD-10-CM | POA: Diagnosis not present

## 2021-02-13 DIAGNOSIS — M1990 Unspecified osteoarthritis, unspecified site: Secondary | ICD-10-CM | POA: Diagnosis not present

## 2021-02-15 ENCOUNTER — Telehealth: Payer: Self-pay

## 2021-02-15 NOTE — Telephone Encounter (Signed)
Pt calling to see if she can bring her labs results in with her to add to the ones we have a see if we can come up c a reason why she can't get preg.  [phone]  Mailbox is full.

## 2021-02-16 DIAGNOSIS — M1990 Unspecified osteoarthritis, unspecified site: Secondary | ICD-10-CM | POA: Diagnosis not present

## 2021-02-16 NOTE — Telephone Encounter (Signed)
Mailbox is full.

## 2021-02-17 NOTE — Telephone Encounter (Signed)
2nd attempt - mailbox is full

## 2021-02-20 DIAGNOSIS — J449 Chronic obstructive pulmonary disease, unspecified: Secondary | ICD-10-CM | POA: Diagnosis not present

## 2021-02-21 DIAGNOSIS — S199XXA Unspecified injury of neck, initial encounter: Secondary | ICD-10-CM | POA: Diagnosis not present

## 2021-02-21 DIAGNOSIS — F17210 Nicotine dependence, cigarettes, uncomplicated: Secondary | ICD-10-CM | POA: Diagnosis not present

## 2021-02-21 DIAGNOSIS — G44319 Acute post-traumatic headache, not intractable: Secondary | ICD-10-CM | POA: Diagnosis not present

## 2021-02-21 DIAGNOSIS — M545 Low back pain, unspecified: Secondary | ICD-10-CM | POA: Diagnosis not present

## 2021-02-21 DIAGNOSIS — M797 Fibromyalgia: Secondary | ICD-10-CM | POA: Diagnosis not present

## 2021-02-21 DIAGNOSIS — S0990XA Unspecified injury of head, initial encounter: Secondary | ICD-10-CM | POA: Diagnosis not present

## 2021-02-21 DIAGNOSIS — J449 Chronic obstructive pulmonary disease, unspecified: Secondary | ICD-10-CM | POA: Diagnosis not present

## 2021-02-21 DIAGNOSIS — Z9981 Dependence on supplemental oxygen: Secondary | ICD-10-CM | POA: Diagnosis not present

## 2021-02-21 DIAGNOSIS — R202 Paresthesia of skin: Secondary | ICD-10-CM | POA: Diagnosis not present

## 2021-02-21 DIAGNOSIS — M542 Cervicalgia: Secondary | ICD-10-CM | POA: Diagnosis not present

## 2021-02-21 DIAGNOSIS — Z79899 Other long term (current) drug therapy: Secondary | ICD-10-CM | POA: Diagnosis not present

## 2021-02-21 DIAGNOSIS — G8929 Other chronic pain: Secondary | ICD-10-CM | POA: Diagnosis not present

## 2021-02-21 DIAGNOSIS — Z8673 Personal history of transient ischemic attack (TIA), and cerebral infarction without residual deficits: Secondary | ICD-10-CM | POA: Diagnosis not present

## 2021-02-23 DIAGNOSIS — J449 Chronic obstructive pulmonary disease, unspecified: Secondary | ICD-10-CM | POA: Diagnosis not present

## 2021-03-13 DIAGNOSIS — M1990 Unspecified osteoarthritis, unspecified site: Secondary | ICD-10-CM | POA: Diagnosis not present

## 2021-03-17 ENCOUNTER — Ambulatory Visit: Payer: Self-pay | Admitting: *Deleted

## 2021-03-17 NOTE — Telephone Encounter (Signed)
Pt called back in, stating that she is having severe chronic back pain and has ran out of her Perocet 5-325mg  medication. She says she hasnt had her medicine in a week to week in half now. Her lower back hurts 6-7/10 pain level and radiates into her legs. She says that she has numbness in her legs as well. She has been using heating pad and resting but that is not helping. Reports that her providers she was seeing in the office have left. She has an appt with [facility] in 05/2021. Advised pt she can virtual UC visit or go to ER. Advised pt that UC normally wont prescribe narcotics. She states that if the pain gets worse she will try to go to [facility] ED tonight. Care advice given and pt verbalized understanding. No other questions/concerns noted.

## 2021-03-17 NOTE — Telephone Encounter (Signed)
Second attempt to call patient- no answer and mailbox is full- unable to leave message

## 2021-03-17 NOTE — Telephone Encounter (Signed)
Patient called, no answer, mailbox is full. Unable to reach patient after 3 attempts by [facility] NT, closing encounter due to no PCP at present.     Summary: Clinical Advice    Patient experiencing chronic back pain and no longer has a PCP. Patient scheduled a new patient appointment with [name] at [facility] for 06/05/2020, seeking clinical advice

## 2021-03-17 NOTE — Telephone Encounter (Signed)
Summary: Clinical Advice   Patient experiencing chronic back pain and no longer has a PCP. Patient scheduled a new patient appointment with [name] at [facility] for 06/05/2020, seeking clinical advice      Attempted to call patient- no answer and mailbox is full. Unable to leave call back message
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
## 2021-03-22 DIAGNOSIS — J449 Chronic obstructive pulmonary disease, unspecified: Secondary | ICD-10-CM | POA: Diagnosis not present

## 2021-03-25 DIAGNOSIS — J449 Chronic obstructive pulmonary disease, unspecified: Secondary | ICD-10-CM | POA: Diagnosis not present

## 2021-04-11 DIAGNOSIS — E119 Type 2 diabetes mellitus without complications: Secondary | ICD-10-CM | POA: Diagnosis not present

## 2021-04-11 DIAGNOSIS — E039 Hypothyroidism, unspecified: Secondary | ICD-10-CM | POA: Diagnosis not present

## 2021-04-11 DIAGNOSIS — E7800 Pure hypercholesterolemia, unspecified: Secondary | ICD-10-CM | POA: Diagnosis not present

## 2021-04-16 DIAGNOSIS — R200 Anesthesia of skin: Secondary | ICD-10-CM | POA: Diagnosis not present

## 2021-04-16 DIAGNOSIS — G43909 Migraine, unspecified, not intractable, without status migrainosus: Secondary | ICD-10-CM | POA: Diagnosis not present

## 2021-04-16 DIAGNOSIS — E873 Alkalosis: Secondary | ICD-10-CM | POA: Diagnosis not present

## 2021-04-16 DIAGNOSIS — Z888 Allergy status to other drugs, medicaments and biological substances status: Secondary | ICD-10-CM | POA: Diagnosis not present

## 2021-04-16 DIAGNOSIS — Z7951 Long term (current) use of inhaled steroids: Secondary | ICD-10-CM | POA: Diagnosis not present

## 2021-04-16 DIAGNOSIS — R202 Paresthesia of skin: Secondary | ICD-10-CM | POA: Diagnosis not present

## 2021-04-16 DIAGNOSIS — G43009 Migraine without aura, not intractable, without status migrainosus: Secondary | ICD-10-CM | POA: Diagnosis not present

## 2021-04-16 DIAGNOSIS — J449 Chronic obstructive pulmonary disease, unspecified: Secondary | ICD-10-CM | POA: Diagnosis not present

## 2021-04-16 DIAGNOSIS — G629 Polyneuropathy, unspecified: Secondary | ICD-10-CM | POA: Diagnosis not present

## 2021-04-16 DIAGNOSIS — F17210 Nicotine dependence, cigarettes, uncomplicated: Secondary | ICD-10-CM | POA: Diagnosis not present

## 2021-04-22 DIAGNOSIS — J449 Chronic obstructive pulmonary disease, unspecified: Secondary | ICD-10-CM | POA: Diagnosis not present

## 2021-04-24 DIAGNOSIS — M549 Dorsalgia, unspecified: Secondary | ICD-10-CM | POA: Diagnosis not present

## 2021-04-24 DIAGNOSIS — F17210 Nicotine dependence, cigarettes, uncomplicated: Secondary | ICD-10-CM | POA: Diagnosis not present

## 2021-04-24 DIAGNOSIS — R1030 Lower abdominal pain, unspecified: Secondary | ICD-10-CM | POA: Diagnosis not present

## 2021-04-24 DIAGNOSIS — R6889 Other general symptoms and signs: Secondary | ICD-10-CM | POA: Diagnosis not present

## 2021-04-24 DIAGNOSIS — M797 Fibromyalgia: Secondary | ICD-10-CM | POA: Diagnosis not present

## 2021-04-24 DIAGNOSIS — G8929 Other chronic pain: Secondary | ICD-10-CM | POA: Diagnosis not present

## 2021-04-24 DIAGNOSIS — J449 Chronic obstructive pulmonary disease, unspecified: Secondary | ICD-10-CM | POA: Diagnosis not present

## 2021-04-24 DIAGNOSIS — R109 Unspecified abdominal pain: Secondary | ICD-10-CM | POA: Diagnosis not present

## 2021-04-24 DIAGNOSIS — R1032 Left lower quadrant pain: Secondary | ICD-10-CM | POA: Diagnosis not present

## 2021-04-24 DIAGNOSIS — Z743 Need for continuous supervision: Secondary | ICD-10-CM | POA: Diagnosis not present

## 2021-04-24 DIAGNOSIS — D72829 Elevated white blood cell count, unspecified: Secondary | ICD-10-CM | POA: Diagnosis not present

## 2021-04-24 DIAGNOSIS — R1031 Right lower quadrant pain: Secondary | ICD-10-CM | POA: Diagnosis not present

## 2021-04-24 DIAGNOSIS — Z79899 Other long term (current) drug therapy: Secondary | ICD-10-CM | POA: Diagnosis not present

## 2021-04-24 DIAGNOSIS — Z20822 Contact with and (suspected) exposure to covid-19: Secondary | ICD-10-CM | POA: Diagnosis not present

## 2021-04-25 DIAGNOSIS — J449 Chronic obstructive pulmonary disease, unspecified: Secondary | ICD-10-CM | POA: Diagnosis not present

## 2021-05-12 DIAGNOSIS — E039 Hypothyroidism, unspecified: Secondary | ICD-10-CM | POA: Diagnosis not present

## 2021-05-12 DIAGNOSIS — E7800 Pure hypercholesterolemia, unspecified: Secondary | ICD-10-CM | POA: Diagnosis not present

## 2021-05-12 DIAGNOSIS — E119 Type 2 diabetes mellitus without complications: Secondary | ICD-10-CM | POA: Diagnosis not present

## 2021-05-23 DIAGNOSIS — M797 Fibromyalgia: Secondary | ICD-10-CM | POA: Diagnosis not present

## 2021-05-23 DIAGNOSIS — J449 Chronic obstructive pulmonary disease, unspecified: Secondary | ICD-10-CM | POA: Diagnosis not present

## 2021-05-23 DIAGNOSIS — R509 Fever, unspecified: Secondary | ICD-10-CM | POA: Diagnosis not present

## 2021-05-23 DIAGNOSIS — R109 Unspecified abdominal pain: Secondary | ICD-10-CM | POA: Diagnosis not present

## 2021-05-23 DIAGNOSIS — R1030 Lower abdominal pain, unspecified: Secondary | ICD-10-CM | POA: Diagnosis not present

## 2021-05-23 DIAGNOSIS — Z79899 Other long term (current) drug therapy: Secondary | ICD-10-CM | POA: Diagnosis not present

## 2021-05-23 DIAGNOSIS — R1032 Left lower quadrant pain: Secondary | ICD-10-CM | POA: Diagnosis not present

## 2021-05-23 DIAGNOSIS — M549 Dorsalgia, unspecified: Secondary | ICD-10-CM | POA: Diagnosis not present

## 2021-05-23 DIAGNOSIS — Z743 Need for continuous supervision: Secondary | ICD-10-CM | POA: Diagnosis not present

## 2021-05-23 DIAGNOSIS — R1031 Right lower quadrant pain: Secondary | ICD-10-CM | POA: Diagnosis not present

## 2021-05-23 DIAGNOSIS — R1084 Generalized abdominal pain: Secondary | ICD-10-CM | POA: Diagnosis not present

## 2021-05-26 DIAGNOSIS — J449 Chronic obstructive pulmonary disease, unspecified: Secondary | ICD-10-CM | POA: Diagnosis not present

## 2021-05-27 DIAGNOSIS — K5900 Constipation, unspecified: Secondary | ICD-10-CM | POA: Diagnosis not present

## 2021-05-27 DIAGNOSIS — R109 Unspecified abdominal pain: Secondary | ICD-10-CM | POA: Diagnosis not present

## 2021-05-27 DIAGNOSIS — R1032 Left lower quadrant pain: Secondary | ICD-10-CM | POA: Diagnosis not present

## 2021-05-27 DIAGNOSIS — F32A Depression, unspecified: Secondary | ICD-10-CM | POA: Diagnosis not present

## 2021-05-27 DIAGNOSIS — Z9981 Dependence on supplemental oxygen: Secondary | ICD-10-CM | POA: Diagnosis not present

## 2021-05-27 DIAGNOSIS — D649 Anemia, unspecified: Secondary | ICD-10-CM | POA: Diagnosis not present

## 2021-05-27 DIAGNOSIS — G894 Chronic pain syndrome: Secondary | ICD-10-CM | POA: Diagnosis not present

## 2021-05-27 DIAGNOSIS — M797 Fibromyalgia: Secondary | ICD-10-CM | POA: Diagnosis not present

## 2021-05-27 DIAGNOSIS — K5090 Crohn's disease, unspecified, without complications: Secondary | ICD-10-CM | POA: Diagnosis not present

## 2021-05-27 DIAGNOSIS — Z79899 Other long term (current) drug therapy: Secondary | ICD-10-CM | POA: Diagnosis not present

## 2021-05-27 DIAGNOSIS — Z8673 Personal history of transient ischemic attack (TIA), and cerebral infarction without residual deficits: Secondary | ICD-10-CM | POA: Diagnosis not present

## 2021-05-27 DIAGNOSIS — F17210 Nicotine dependence, cigarettes, uncomplicated: Secondary | ICD-10-CM | POA: Diagnosis not present

## 2021-05-27 DIAGNOSIS — G8929 Other chronic pain: Secondary | ICD-10-CM | POA: Diagnosis not present

## 2021-05-27 DIAGNOSIS — J449 Chronic obstructive pulmonary disease, unspecified: Secondary | ICD-10-CM | POA: Diagnosis not present

## 2021-05-27 DIAGNOSIS — R1031 Right lower quadrant pain: Secondary | ICD-10-CM | POA: Diagnosis not present

## 2021-06-05 ENCOUNTER — Ambulatory Visit: Payer: Medicare Other | Admitting: Nurse Practitioner

## 2021-06-05 NOTE — Progress Notes (Unsigned)
There were no vitals taken for this visit.   Subjective:    Patient ID: Denise Ellis, female    DOB: 06-29-1980, 41 y.o.   MRN: [id number]  HPI: ALAJA Ellis is a 41 y.o. female  No chief complaint on file.  Patient presents to clinic to establish care with new PCP.  Introduced to Designer, jewellery role and practice setting.  All questions answered.  Discussed provider/patient relationship and expectations.  Patient reports a history of ***. Patient denies a history of: Hypertension, Elevated Cholesterol, Diabetes, Thyroid problems, Depression, Anxiety, Neurological problems, and Abdominal problems.   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   Blurry vision 07/18/2013   Chest pain 11/21/2014   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/25/2012   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   Esophageal reflux [id number]   Follicular cyst of ovary 08/25/2012   Hypovitaminosis D 08/10/2013   Insomnia 07/20/2013   Irritable colon 08/25/2012   Joint pain 03/28/2009   Major depressive disorder, single episode 06/13/2012   Migraine 08/25/2012   Orthostatic hypotension 07/18/2013   Paroxysmal nocturnal dyspnea 07/20/2013   Polycystic ovaries 10/06/2008   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[place]) 11/14/2015   Suicidal ideation 08/25/2012   Symptom associated with female genital organs 08/25/2012   Transient ischemic attack  (TIA), and cerebral infarction without residual deficits(V12.54) 08/25/2012   Vertigo 07/17/2013   Acute exacerbation of chronic low back pain 09/05/2014   Nephrolithiasis 08/31/2020   Perceived hearing changes 08/17/2020   Ureteric stone 05/11/2020   Resolved Ambulatory Problems    Diagnosis Date Noted   No Resolved Ambulatory Problems   Past Medical History:  Diagnosis Date   Crohn's disease ([place])    DDD (degenerative disc disease)    Stroke ([place])    Past Surgical History:  Procedure Laterality Date   APPENDECTOMY     Family History  Problem Relation Age of Onset   Ovarian cancer Mother    Heart disease Father    Diabetes Mellitus I Father      Review of Systems  Per HPI unless specifically indicated above     Objective:    There were no vitals taken for this visit.  Wt Readings from Last 3 Encounters:  10/21/19 258 lb (117 kg)  12/26/14 230 lb (104.3 kg)  07/22/14 213 lb (96.6 kg)    Physical Exam  Results for orders placed or performed during the hospital encounter of 10/01/17  Protime-INR  Result Value Ref Range   Prothrombin Time 13.6 11.4 - 15.2 seconds   INR 1.05   APTT  Result Value Ref Range   aPTT 32 24 - 36 seconds  Comprehensive metabolic panel  Result Value Ref Range   Sodium 139 135 - 145 mmol/L   Potassium 3.9 3.5 - 5.1 mmol/L   Chloride 106 101 - 111 mmol/L   CO2 20 (L) 22 - 32 mmol/L   Glucose, Bld 78 65 -  99 mg/dL   BUN 7 6 - 20 mg/dL   Creatinine, Ser 0.67 0.44 - 1.00 mg/dL   Calcium 9.2 8.9 - 10.3 mg/dL   Total Protein 7.3 6.5 - 8.1 g/dL   Albumin 3.8 3.5 - 5.0 g/dL   AST 16 15 - 41 U/L   ALT 14 14 - 54 U/L   Alkaline Phosphatase 90 38 - 126 U/L   Total Bilirubin 0.4 0.3 - 1.2 mg/dL   GFR calc non Af Amer >60 >60 mL/min   GFR calc Af Amer >60 >60 mL/min   Anion gap 13 5 - 15  Urinalysis, Routine w reflex microscopic  Result Value Ref Range   Color, Urine AMBER (A) YELLOW   APPearance TURBID (A) CLEAR   Specific Gravity,  Urine 1.029 1.005 - 1.030   pH 5.0 5.0 - 8.0   Glucose, UA NEGATIVE NEGATIVE mg/dL   Hgb urine dipstick SMALL (A) NEGATIVE   Bilirubin Urine NEGATIVE NEGATIVE   Ketones, ur NEGATIVE NEGATIVE mg/dL   Protein, ur 30 (A) NEGATIVE mg/dL   Nitrite NEGATIVE NEGATIVE   Leukocytes, UA NEGATIVE NEGATIVE   RBC / HPF 0-5 0 - 5 RBC/hpf   WBC, UA 0-5 0 - 5 WBC/hpf   Bacteria, UA FEW (A) NONE SEEN   Squamous Epithelial / LPF 0-5 0 - 5   Mucus PRESENT   I-stat troponin, ED  Result Value Ref Range   Troponin i, poc 0.00 0.00 - 0.08 ng/mL   Comment 3          I-Stat Chem 8, ED  Result Value Ref Range   Sodium 138 135 - 145 mmol/L   Potassium 3.8 3.5 - 5.1 mmol/L   Chloride 107 101 - 111 mmol/L   BUN 10 6 - 20 mg/dL   Creatinine, Ser 0.60 0.44 - 1.00 mg/dL   Glucose, Bld 78 65 - 99 mg/dL   Calcium, Ion 0.98 (L) 1.15 - 1.40 mmol/L   TCO2 22 22 - 32 mmol/L   Hemoglobin 14.6 12.0 - 15.0 g/dL   HCT 43.0 36.0 - 46.0 %  I-Stat beta hCG blood, ED  Result Value Ref Range   I-stat hCG, quantitative <5.0 <5 mIU/mL   Comment 3              Assessment & Plan:   Problem List Items Addressed This Visit       Respiratory   COPD (chronic obstructive pulmonary disease) ([place]) - Primary     Follow up plan: No follow-ups on file.

## 2021-06-08 DIAGNOSIS — M1990 Unspecified osteoarthritis, unspecified site: Secondary | ICD-10-CM | POA: Diagnosis not present

## 2021-06-13 DIAGNOSIS — S90851A Superficial foreign body, right foot, initial encounter: Secondary | ICD-10-CM | POA: Diagnosis not present

## 2021-06-13 DIAGNOSIS — R52 Pain, unspecified: Secondary | ICD-10-CM | POA: Diagnosis not present

## 2021-06-20 DIAGNOSIS — J449 Chronic obstructive pulmonary disease, unspecified: Secondary | ICD-10-CM | POA: Diagnosis not present

## 2021-06-23 DIAGNOSIS — J449 Chronic obstructive pulmonary disease, unspecified: Secondary | ICD-10-CM | POA: Diagnosis not present

## 2021-07-11 DIAGNOSIS — M1990 Unspecified osteoarthritis, unspecified site: Secondary | ICD-10-CM | POA: Diagnosis not present

## 2021-07-21 DIAGNOSIS — J449 Chronic obstructive pulmonary disease, unspecified: Secondary | ICD-10-CM | POA: Diagnosis not present

## 2021-07-24 DIAGNOSIS — J449 Chronic obstructive pulmonary disease, unspecified: Secondary | ICD-10-CM | POA: Diagnosis not present

## 2021-07-26 LAB — HM PAP SMEAR: HM Pap smear: NEGATIVE

## 2021-07-26 LAB — RESULTS CONSOLE HPV: CHL HPV: NEGATIVE

## 2021-07-31 DIAGNOSIS — J449 Chronic obstructive pulmonary disease, unspecified: Secondary | ICD-10-CM | POA: Diagnosis present

## 2021-08-07 DIAGNOSIS — M1990 Unspecified osteoarthritis, unspecified site: Secondary | ICD-10-CM | POA: Diagnosis not present

## 2021-08-14 ENCOUNTER — Ambulatory Visit: Payer: Self-pay

## 2021-08-14 NOTE — Telephone Encounter (Signed)
?  Chief Complaint: bilateral leg and arm swelling ?Symptoms: IBID ?Frequency: ongoing - mostly the last 4 days -  ?Pertinent Negatives: Patient denies SOB, COUGH, Chest pain ?Disposition: [x] ED /[x] Urgent Care (no appt availability in office) / [] Appointment(In office/virtual)/ []  [place] [facility]/ [] Home Care/ [] Refused Recommended Disposition /[] [place] [facility]/ []  Follow-up with PCP ?Additional Notes: Pt using lasix with no improvement. Pt has hx of back issues. ?Reason for Disposition ? [1] MODERATE leg swelling (e.g., swelling extends up to knees) AND [2] new-onset or worsening ? ?Answer Assessment - Initial Assessment Questions ?1. ONSET: "When did the swelling start?" (e.g., minutes, hours, days) ?    4 days ?2. LOCATION: "What part of the leg is swollen?"  "Are both legs swollen or just one leg?" ?    Both and arms ?3. SEVERITY: "How bad is the swelling?" (e.g., localized; mild, moderate, severe) ? - Localized - small area of swelling localized to one leg ? - MILD pedal edema - swelling limited to foot and ankle, pitting edema < 1/4 inch (6 mm) deep, rest and elevation eliminate most or all swelling ? - MODERATE edema - swelling of lower leg to knee, pitting edema > 1/4 inch (6 mm) deep, rest and elevation only partially reduce swelling ? - SEVERE edema - swelling extends above knee, facial or hand swelling present  ?    severe ?4. REDNESS: "Does the swelling look red or infected?" ?    red ?5. PAIN: "Is the swelling painful to touch?" If Yes, ask: "How painful is it?"   (Scale 1-10; mild, moderate or severe) ?    7/10 ?6. FEVER: "Do you have a fever?" If Yes, ask: "What is it, how was it measured, and when did it start?"  ?    no ?7. CAUSE: "What do you think is causing the leg swelling?" ?    unknown ?8. MEDICAL HISTORY: "Do you have a history of heart failure, kidney disease, liver failure, or cancer?" ?    no ?9. RECURRENT SYMPTOM: "Have you had leg swelling before?" If Yes, ask: "When was  the last time?" "What happened that time?" ?    yes ?10. OTHER SYMPTOMS: "Do you have any other symptoms?" (e.g., chest pain, difficulty breathing) ?      no ?11. PREGNANCY: "Is there any chance you are pregnant?" "When was your last menstrual period?" ?      no ? ?Protocols used: Leg Swelling and Edema-A-AH ? ?

## 2021-08-17 DIAGNOSIS — R600 Localized edema: Secondary | ICD-10-CM | POA: Diagnosis not present

## 2021-08-17 DIAGNOSIS — Z87891 Personal history of nicotine dependence: Secondary | ICD-10-CM | POA: Diagnosis not present

## 2021-08-17 DIAGNOSIS — G8929 Other chronic pain: Secondary | ICD-10-CM | POA: Diagnosis not present

## 2021-08-17 DIAGNOSIS — M5440 Lumbago with sciatica, unspecified side: Secondary | ICD-10-CM | POA: Diagnosis not present

## 2021-08-20 DIAGNOSIS — J449 Chronic obstructive pulmonary disease, unspecified: Secondary | ICD-10-CM | POA: Diagnosis not present

## 2021-08-22 DIAGNOSIS — M5451 Vertebrogenic low back pain: Secondary | ICD-10-CM | POA: Diagnosis not present

## 2021-08-22 DIAGNOSIS — M47816 Spondylosis without myelopathy or radiculopathy, lumbar region: Secondary | ICD-10-CM | POA: Diagnosis not present

## 2021-08-22 DIAGNOSIS — M5136 Other intervertebral disc degeneration, lumbar region: Secondary | ICD-10-CM | POA: Diagnosis not present

## 2021-08-22 DIAGNOSIS — Z5181 Encounter for therapeutic drug level monitoring: Secondary | ICD-10-CM | POA: Diagnosis not present

## 2021-08-22 DIAGNOSIS — M5416 Radiculopathy, lumbar region: Secondary | ICD-10-CM | POA: Diagnosis not present

## 2021-08-22 DIAGNOSIS — M519 Unspecified thoracic, thoracolumbar and lumbosacral intervertebral disc disorder: Secondary | ICD-10-CM | POA: Diagnosis not present

## 2021-08-22 DIAGNOSIS — Z79891 Long term (current) use of opiate analgesic: Secondary | ICD-10-CM | POA: Diagnosis not present

## 2021-08-23 DIAGNOSIS — Z79891 Long term (current) use of opiate analgesic: Secondary | ICD-10-CM | POA: Diagnosis not present

## 2021-09-01 ENCOUNTER — Ambulatory Visit: Payer: Self-pay

## 2021-09-01 ENCOUNTER — Telehealth: Payer: Self-pay

## 2021-09-01 NOTE — Telephone Encounter (Signed)
Pt is the daughter of Calla Kicks calling to see if Dr. Laural Benes could accept her sooner that January 24 for a new patient appt. ? ?Pt states she has feet and leg swelling, chronic back pain. ?Feet are purple. ?Please advise ?CB- [phone] ?

## 2021-09-01 NOTE — Telephone Encounter (Signed)
? ? ?  Chief Complaint: Bilateral foot and ankle swelling x "several months.My feet are purple." ?Symptoms: Above ?Frequency: Months ago ?Pertinent Negatives: Patient denies chest pain, SOB ?Disposition: [x] ED /[x] Urgent Care (no appt availability in office) / [] Appointment(In office/virtual)/ []  [place] [facility]/ [] Home Care/ [] Refused Recommended Disposition /[] [place] [facility]/ []  Follow-up with PCP ?Additional Notes: Declines ED/UC.Asking to be seen by Dr. . Next available for new pt. Is January. Declines to make appointment for January. "I need to be seen sooner." Asking to be worked in. Please advise . ?Reason for Disposition ? Patient sounds very sick or weak to the triager ? ?Answer Assessment - Initial Assessment Questions ?1. ONSET: "When did the swelling start?" (e.g., minutes, hours, days) ?    Several months ?2. LOCATION: "What part of the leg is swollen?"  "Are both legs swollen or just one leg?" ?    Both feet and ankle ?3. SEVERITY: "How bad is the swelling?" (e.g., localized; mild, moderate, severe) ? - Localized - small area of swelling localized to one leg ? - MILD pedal edema - swelling limited to foot and ankle, pitting edema < 1/4 inch (6 mm) deep, rest and elevation eliminate most or all swelling ? - MODERATE edema - swelling of lower leg to knee, pitting edema > 1/4 inch (6 mm) deep, rest and elevation only partially reduce swelling ? - SEVERE edema - swelling extends above knee, facial or hand swelling present  ?    Mild ?4. REDNESS: "Does the swelling look red or infected?" ?    Feel are purple ?5. PAIN: "Is the swelling painful to touch?" If Yes, ask: "How painful is it?"   (Scale 1-10; mild, moderate or severe) ?    6-7 ?6. FEVER: "Do you have a fever?" If Yes, ask: "What is it, how was it measured, and when did it start?"  ?    No ?7. CAUSE: "What do you think is causing the leg swelling?" ?    Unsure ?8. MEDICAL HISTORY: "Do you have a history of heart failure,  kidney disease, liver failure, or cancer?" ?    No ?9. RECURRENT SYMPTOM: "Have you had leg swelling before?" If Yes, ask: "When was the last time?" "What happened that time?" ?    Yes ?10. OTHER SYMPTOMS: "Do you have any other symptoms?" (e.g., chest pain, difficulty breathing) ?      No ?11. PREGNANCY: "Is there any chance you are pregnant?" "When was your last menstrual period?" ?      No ? ?Protocols used: Leg Swelling and Edema-A-AH ? ?

## 2021-09-04 NOTE — Telephone Encounter (Signed)
Yes. OK to book in a 40 minute slot for new patient appt as able ?

## 2021-09-04 NOTE — Telephone Encounter (Signed)
After further review, patient has had a no show new patient appointment. Patient will need to schedule a next available new patient appointment due to this. Attempted to speak with patient to let her know this. Unable to leave vm due to mailbox being full. If patient calls back please let her know that we are unable to schedule an earlier appointment but her new patient appointment can be added to the waitlist and she will be notified if there is a cancellation. ?

## 2021-09-08 DIAGNOSIS — M1990 Unspecified osteoarthritis, unspecified site: Secondary | ICD-10-CM | POA: Diagnosis not present

## 2021-09-20 DIAGNOSIS — J449 Chronic obstructive pulmonary disease, unspecified: Secondary | ICD-10-CM | POA: Diagnosis not present

## 2021-09-25 ENCOUNTER — Encounter (INDEPENDENT_AMBULATORY_CARE_PROVIDER_SITE_OTHER): Payer: Medicare Other | Admitting: Vascular Surgery

## 2021-10-03 DIAGNOSIS — M1990 Unspecified osteoarthritis, unspecified site: Secondary | ICD-10-CM | POA: Diagnosis not present

## 2021-10-13 DIAGNOSIS — M5440 Lumbago with sciatica, unspecified side: Secondary | ICD-10-CM | POA: Diagnosis not present

## 2021-10-13 DIAGNOSIS — R600 Localized edema: Secondary | ICD-10-CM | POA: Diagnosis not present

## 2021-10-13 DIAGNOSIS — G8929 Other chronic pain: Secondary | ICD-10-CM | POA: Diagnosis not present

## 2021-10-20 DIAGNOSIS — J449 Chronic obstructive pulmonary disease, unspecified: Secondary | ICD-10-CM | POA: Diagnosis not present

## 2021-11-06 DIAGNOSIS — L309 Dermatitis, unspecified: Secondary | ICD-10-CM | POA: Diagnosis not present

## 2021-11-06 DIAGNOSIS — M1990 Unspecified osteoarthritis, unspecified site: Secondary | ICD-10-CM | POA: Diagnosis not present

## 2021-11-08 DIAGNOSIS — Z0130 Encounter for examination of blood pressure without abnormal findings: Secondary | ICD-10-CM | POA: Diagnosis not present

## 2021-11-08 DIAGNOSIS — Z79899 Other long term (current) drug therapy: Secondary | ICD-10-CM | POA: Diagnosis not present

## 2021-11-08 DIAGNOSIS — G8929 Other chronic pain: Secondary | ICD-10-CM | POA: Diagnosis not present

## 2021-11-08 DIAGNOSIS — M549 Dorsalgia, unspecified: Secondary | ICD-10-CM | POA: Diagnosis not present

## 2021-11-08 DIAGNOSIS — M797 Fibromyalgia: Secondary | ICD-10-CM | POA: Diagnosis not present

## 2021-11-08 DIAGNOSIS — F32A Depression, unspecified: Secondary | ICD-10-CM | POA: Diagnosis not present

## 2021-11-15 DIAGNOSIS — Z0130 Encounter for examination of blood pressure without abnormal findings: Secondary | ICD-10-CM | POA: Diagnosis not present

## 2021-11-15 DIAGNOSIS — G894 Chronic pain syndrome: Secondary | ICD-10-CM | POA: Diagnosis not present

## 2021-11-15 DIAGNOSIS — G8929 Other chronic pain: Secondary | ICD-10-CM | POA: Diagnosis not present

## 2021-11-15 DIAGNOSIS — F32A Depression, unspecified: Secondary | ICD-10-CM | POA: Diagnosis not present

## 2021-11-15 DIAGNOSIS — Z79899 Other long term (current) drug therapy: Secondary | ICD-10-CM | POA: Diagnosis not present

## 2021-11-15 DIAGNOSIS — G4700 Insomnia, unspecified: Secondary | ICD-10-CM | POA: Diagnosis not present

## 2021-11-15 DIAGNOSIS — M797 Fibromyalgia: Secondary | ICD-10-CM | POA: Diagnosis not present

## 2021-11-15 DIAGNOSIS — M549 Dorsalgia, unspecified: Secondary | ICD-10-CM | POA: Diagnosis not present

## 2021-11-20 DIAGNOSIS — Z79899 Other long term (current) drug therapy: Secondary | ICD-10-CM | POA: Diagnosis not present

## 2021-11-20 DIAGNOSIS — J449 Chronic obstructive pulmonary disease, unspecified: Secondary | ICD-10-CM | POA: Diagnosis not present

## 2021-11-25 DIAGNOSIS — M549 Dorsalgia, unspecified: Secondary | ICD-10-CM | POA: Diagnosis not present

## 2021-11-25 DIAGNOSIS — G8929 Other chronic pain: Secondary | ICD-10-CM | POA: Diagnosis not present

## 2021-11-25 DIAGNOSIS — G894 Chronic pain syndrome: Secondary | ICD-10-CM | POA: Diagnosis not present

## 2021-11-25 DIAGNOSIS — Z0130 Encounter for examination of blood pressure without abnormal findings: Secondary | ICD-10-CM | POA: Diagnosis not present

## 2021-11-25 DIAGNOSIS — F32A Depression, unspecified: Secondary | ICD-10-CM | POA: Diagnosis not present

## 2021-11-25 DIAGNOSIS — M797 Fibromyalgia: Secondary | ICD-10-CM | POA: Diagnosis not present

## 2021-11-25 DIAGNOSIS — G4700 Insomnia, unspecified: Secondary | ICD-10-CM | POA: Diagnosis not present

## 2021-11-25 DIAGNOSIS — Z79899 Other long term (current) drug therapy: Secondary | ICD-10-CM | POA: Diagnosis not present

## 2021-11-29 DIAGNOSIS — Z79899 Other long term (current) drug therapy: Secondary | ICD-10-CM | POA: Diagnosis not present

## 2021-12-07 DIAGNOSIS — E119 Type 2 diabetes mellitus without complications: Secondary | ICD-10-CM | POA: Diagnosis not present

## 2021-12-07 DIAGNOSIS — R52 Pain, unspecified: Secondary | ICD-10-CM | POA: Diagnosis not present

## 2021-12-07 DIAGNOSIS — M1990 Unspecified osteoarthritis, unspecified site: Secondary | ICD-10-CM | POA: Diagnosis not present

## 2021-12-08 DIAGNOSIS — M549 Dorsalgia, unspecified: Secondary | ICD-10-CM | POA: Diagnosis not present

## 2021-12-08 DIAGNOSIS — R5383 Other fatigue: Secondary | ICD-10-CM | POA: Diagnosis not present

## 2021-12-13 ENCOUNTER — Ambulatory Visit: Payer: Medicare Other | Admitting: Family Medicine

## 2021-12-18 ENCOUNTER — Other Ambulatory Visit: Payer: Self-pay | Admitting: Family Medicine

## 2021-12-18 DIAGNOSIS — G8929 Other chronic pain: Secondary | ICD-10-CM

## 2021-12-21 ENCOUNTER — Encounter (INDEPENDENT_AMBULATORY_CARE_PROVIDER_SITE_OTHER): Payer: Medicare Other | Admitting: Vascular Surgery

## 2021-12-21 DIAGNOSIS — J449 Chronic obstructive pulmonary disease, unspecified: Secondary | ICD-10-CM | POA: Diagnosis not present

## 2022-01-06 DIAGNOSIS — M5442 Lumbago with sciatica, left side: Secondary | ICD-10-CM | POA: Diagnosis not present

## 2022-01-06 DIAGNOSIS — F17210 Nicotine dependence, cigarettes, uncomplicated: Secondary | ICD-10-CM | POA: Diagnosis not present

## 2022-01-06 DIAGNOSIS — G8929 Other chronic pain: Secondary | ICD-10-CM | POA: Diagnosis not present

## 2022-01-06 DIAGNOSIS — M5441 Lumbago with sciatica, right side: Secondary | ICD-10-CM | POA: Diagnosis not present

## 2022-01-06 DIAGNOSIS — Z888 Allergy status to other drugs, medicaments and biological substances status: Secondary | ICD-10-CM | POA: Diagnosis not present

## 2022-01-06 DIAGNOSIS — Z881 Allergy status to other antibiotic agents status: Secondary | ICD-10-CM | POA: Diagnosis not present

## 2022-01-06 DIAGNOSIS — Z8673 Personal history of transient ischemic attack (TIA), and cerebral infarction without residual deficits: Secondary | ICD-10-CM | POA: Diagnosis not present

## 2022-01-06 DIAGNOSIS — Z886 Allergy status to analgesic agent status: Secondary | ICD-10-CM | POA: Diagnosis not present

## 2022-01-06 DIAGNOSIS — F32A Depression, unspecified: Secondary | ICD-10-CM | POA: Diagnosis not present

## 2022-01-08 DIAGNOSIS — M1990 Unspecified osteoarthritis, unspecified site: Secondary | ICD-10-CM | POA: Diagnosis not present

## 2022-01-08 DIAGNOSIS — R52 Pain, unspecified: Secondary | ICD-10-CM | POA: Diagnosis not present

## 2022-01-20 DIAGNOSIS — J449 Chronic obstructive pulmonary disease, unspecified: Secondary | ICD-10-CM | POA: Diagnosis not present

## 2022-02-06 DIAGNOSIS — Z0130 Encounter for examination of blood pressure without abnormal findings: Secondary | ICD-10-CM | POA: Diagnosis not present

## 2022-02-06 DIAGNOSIS — G894 Chronic pain syndrome: Secondary | ICD-10-CM | POA: Diagnosis not present

## 2022-02-06 DIAGNOSIS — M797 Fibromyalgia: Secondary | ICD-10-CM | POA: Diagnosis not present

## 2022-02-06 DIAGNOSIS — G4700 Insomnia, unspecified: Secondary | ICD-10-CM | POA: Diagnosis not present

## 2022-02-06 DIAGNOSIS — F32A Depression, unspecified: Secondary | ICD-10-CM | POA: Diagnosis not present

## 2022-02-06 DIAGNOSIS — G8929 Other chronic pain: Secondary | ICD-10-CM | POA: Diagnosis not present

## 2022-02-06 DIAGNOSIS — M549 Dorsalgia, unspecified: Secondary | ICD-10-CM | POA: Diagnosis not present

## 2022-02-13 DIAGNOSIS — M5440 Lumbago with sciatica, unspecified side: Secondary | ICD-10-CM | POA: Diagnosis not present

## 2022-02-13 DIAGNOSIS — Z79899 Other long term (current) drug therapy: Secondary | ICD-10-CM | POA: Diagnosis not present

## 2022-02-13 DIAGNOSIS — G8929 Other chronic pain: Secondary | ICD-10-CM | POA: Diagnosis not present

## 2022-02-19 ENCOUNTER — Encounter (INDEPENDENT_AMBULATORY_CARE_PROVIDER_SITE_OTHER): Payer: Self-pay

## 2022-02-20 DIAGNOSIS — J449 Chronic obstructive pulmonary disease, unspecified: Secondary | ICD-10-CM | POA: Diagnosis not present

## 2022-02-21 ENCOUNTER — Ambulatory Visit: Payer: Medicare Other | Admitting: Family

## 2022-03-22 DIAGNOSIS — J449 Chronic obstructive pulmonary disease, unspecified: Secondary | ICD-10-CM | POA: Diagnosis not present

## 2022-04-22 DIAGNOSIS — J449 Chronic obstructive pulmonary disease, unspecified: Secondary | ICD-10-CM | POA: Diagnosis not present

## 2022-05-15 DIAGNOSIS — M5440 Lumbago with sciatica, unspecified side: Secondary | ICD-10-CM | POA: Diagnosis not present

## 2022-05-15 DIAGNOSIS — Z79899 Other long term (current) drug therapy: Secondary | ICD-10-CM | POA: Diagnosis not present

## 2022-05-15 DIAGNOSIS — G8929 Other chronic pain: Secondary | ICD-10-CM | POA: Diagnosis not present

## 2022-05-23 DIAGNOSIS — J449 Chronic obstructive pulmonary disease, unspecified: Secondary | ICD-10-CM | POA: Diagnosis not present

## 2022-06-20 ENCOUNTER — Ambulatory Visit: Payer: Medicare Other | Admitting: Family Medicine

## 2022-06-21 DIAGNOSIS — J449 Chronic obstructive pulmonary disease, unspecified: Secondary | ICD-10-CM | POA: Diagnosis not present

## 2022-07-12 ENCOUNTER — Ambulatory Visit: Payer: Self-pay | Admitting: *Deleted

## 2022-07-12 ENCOUNTER — Encounter: Payer: Self-pay | Admitting: Physician Assistant

## 2022-07-12 ENCOUNTER — Ambulatory Visit (INDEPENDENT_AMBULATORY_CARE_PROVIDER_SITE_OTHER): Payer: 59 | Admitting: Physician Assistant

## 2022-07-12 VITALS — BP 134/62 | HR 105 | Temp 98.7°F | Ht 66.0 in | Wt 262.0 lb

## 2022-07-12 DIAGNOSIS — Z6841 Body Mass Index (BMI) 40.0 and over, adult: Secondary | ICD-10-CM

## 2022-07-12 DIAGNOSIS — F332 Major depressive disorder, recurrent severe without psychotic features: Secondary | ICD-10-CM | POA: Diagnosis not present

## 2022-07-12 DIAGNOSIS — G894 Chronic pain syndrome: Secondary | ICD-10-CM

## 2022-07-12 DIAGNOSIS — G4701 Insomnia due to medical condition: Secondary | ICD-10-CM

## 2022-07-12 DIAGNOSIS — F410 Panic disorder [episodic paroxysmal anxiety] without agoraphobia: Secondary | ICD-10-CM

## 2022-07-12 DIAGNOSIS — F411 Generalized anxiety disorder: Secondary | ICD-10-CM

## 2022-07-12 MED ORDER — TRAMADOL HCL 50 MG PO TABS: 100.0000 mg | ORAL_TABLET | Freq: Two times a day (BID) | ORAL | 1 refills | Status: AC

## 2022-07-12 MED ORDER — CARIPRAZINE HCL 3 MG PO CAPS: 3.0000 mg | ORAL_CAPSULE | Freq: Every day | ORAL | 2 refills | Status: AC

## 2022-07-12 MED ORDER — WEGOVY 0.25 MG/0.5ML ~~LOC~~ SOAJ: 0.2500 mg | SUBCUTANEOUS | 0 refills | Status: DC
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
## 2022-07-12 NOTE — Telephone Encounter (Signed)
Summary: traMADol (ULTRAM) 50 MG tablet not working   The patient called in stating the medicine she was prescribed is not working. She was presribed traMADol (ULTRAM) 50 MG tablet and she says she has taken it for awhile with no relief. She is just not sure what to do at this point. Please assist patient further          Attempted to reach pt, VM full, unable to leave message to CB.

## 2022-07-12 NOTE — Telephone Encounter (Signed)
Second attempt to reach pt. VM full, unable to leave VM to call back.

## 2022-07-12 NOTE — Telephone Encounter (Signed)
  Chief Complaint: Back and leg pain Symptoms: Back and leg pain for years. 8-10/10. Constant Frequency: years Pertinent Negatives: Patient denies  Disposition: [] ED /[] Urgent Care (no appt availability in office) / [] Appointment(In office/virtual)/ []  [facility]/ [] Home Care/ [] Refused Recommended Disposition /[] [facility]/ [x]  Follow-up with PCP Additional Notes: Pt had OV today. States tramadol is not working to control pain. Also requesting "Increase my anti-anxiety meds."  Requesting home health aide, states "I can't do much anymore." Assured pt NT would route to practice for PCPs review. Did have OV today. Reason for Disposition  [1] SEVERE back pain (e.g., excruciating, unable to do any normal activities) AND [2] not improved 2 hours after pain medicine    Pt had appt today.  Answer Assessment - Initial Assessment Questions 1. ONSET: "When did the pain begin?"      For years 2. LOCATION: "Where does it hurt?" (upper, mid or lower back)     Back and legs 3. SEVERITY: "How bad is the pain?"  (e.g., Scale 1-10; mild, moderate, or severe)   - MILD (1-3): Doesn't interfere with normal activities.    - MODERATE (4-7): Interferes with normal activities or awakens from sleep.    - SEVERE (8-10): Excruciating pain, unable to do any normal activities.      8-10/10 4. PATTERN: "Is the pain constant?" (e.g., yes, no; constant, intermittent)      constant 5. RADIATION: "Does the pain shoot into your legs or somewhere else?"     legs 6. CAUSE:  "What do you think is causing the back pain?"      Injury years ago 7. BACK OVERUSE:  "Any recent lifting of heavy objects, strenuous work or exercise?"      8. MEDICINES: "What have you taken so far for the pain?" (e.g., nothing, acetaminophen, NSAIDS)     Tramadol doesn't help. 9. NEUROLOGIC SYMPTOMS: "Do you have any weakness, numbness, or problems with bowel/bladder control?"      10. OTHER SYMPTOMS: "Do you have any  other symptoms?" (e.g., fever, abdomen pain, burning with urination, blood in urine)       Swelling feet and legs  Protocols used: Back Pain-A-AH

## 2022-07-13 NOTE — Telephone Encounter (Signed)
Patient called, mailbox is full, unable to leave a message.

## 2022-07-13 NOTE — Telephone Encounter (Signed)
Called pt could not leave VM. VM full.   Denise Ellis can not increase tramadol or anxiety meds. Urgent referrals have been placed for behavioral health and pain clinic. We would also needs more details as to why she needs home health. Need to know what she would need help with. Pt should clear her VM just in case she misses a phone call from the pain clinic or behavioral health.  KP

## 2022-07-13 NOTE — Telephone Encounter (Signed)
Called patient in regard to previous provider comments, no answer and VM is full.

## 2022-07-16 ENCOUNTER — Encounter: Payer: Self-pay | Admitting: Physician Assistant

## 2022-07-16 ENCOUNTER — Ambulatory Visit: Payer: 59 | Admitting: Physician Assistant

## 2022-07-16 ENCOUNTER — Ambulatory Visit
Admission: RE | Admit: 2022-07-16 | Discharge: 2022-07-16 | Disposition: A | Discharge status: Home / Self Care | Payer: 59 | Source: Ambulatory Visit | Attending: Physician Assistant | Admitting: Physician Assistant

## 2022-07-16 ENCOUNTER — Ambulatory Visit (INDEPENDENT_AMBULATORY_CARE_PROVIDER_SITE_OTHER): Payer: 59 | Admitting: Physician Assistant

## 2022-07-16 ENCOUNTER — Ambulatory Visit
Admission: RE | Admit: 2022-07-16 | Discharge: 2022-07-16 | Disposition: A | Discharge status: Home / Self Care | Payer: 59 | Attending: Physician Assistant | Admitting: Physician Assistant

## 2022-07-16 VITALS — BP 124/70 | HR 98 | Temp 98.1°F | Ht 66.0 in | Wt 265.8 lb

## 2022-07-16 DIAGNOSIS — M79672 Pain in left foot: Secondary | ICD-10-CM | POA: Insufficient documentation

## 2022-07-16 DIAGNOSIS — L301 Dyshidrosis [pompholyx]: Secondary | ICD-10-CM | POA: Insufficient documentation

## 2022-07-16 MED ORDER — TRIAMCINOLONE ACETONIDE 0.1 % EX CREA: TOPICAL_CREAM | Freq: Two times a day (BID) | CUTANEOUS | 2 refills | Status: DC

## 2022-07-16 NOTE — Progress Notes (Unsigned)
Date:  07/16/2022   Name:  Denise Ellis   DOB:  1981/03/23   MRN:  [id number]   Chief Complaint: Foot Swelling  HPI Denise Ellis is a 42 year old female who presents today because she was awoken at 3 AM this morning with severe pain and swelling of the left foot, reporting her foot was blue and toes were black.  She contacted the triage line who advised her to present to the ED, but she refused.  Per her report, they suggested that she might be having a heart attack; patient is not experiencing any chest pain or shortness of breath.  She is currently smoking about half pack per day.  She first began having foot trouble/swelling about 2 years ago, but never like this.  Currently reports bilateral swollen ankles, taking Lasix 20 mg once daily for this.  Swelling has significantly improved since this morning, patient has been using ice on the area but nothing else.  Also having some swelling in the hands, states her rings are tight on her fingers. Mother has rheumatoid arthritis.  Unrelated, she would like for me to refill her hand cream for dyshidrotic eczema  Recent Labs     Component Value Date/Time   NA 138 10/01/2017 0208   NA 145 07/17/2011 1902   K 3.8 10/01/2017 0208   K 3.7 07/17/2011 1902   CL 107 10/01/2017 0208   CL 109 (H) 07/17/2011 1902   CO2 20 (L) 10/01/2017 0119   CO2 25 07/17/2011 1902   GLUCOSE 78 10/01/2017 0208   GLUCOSE 92 07/17/2011 1902   BUN 10 10/01/2017 0208   BUN 5 (L) 07/17/2011 1902   CREATININE 0.60 10/01/2017 0208   CREATININE 0.69 07/17/2011 1902   CALCIUM 9.2 10/01/2017 0119   CALCIUM 8.8 07/17/2011 1902   PROT 7.3 10/01/2017 0119   PROT 7.3 07/17/2011 1902   ALBUMIN 3.8 10/01/2017 0119   ALBUMIN 3.6 07/17/2011 1902   AST 16 10/01/2017 0119   AST 18 07/17/2011 1902   ALT 14 10/01/2017 0119   ALT 22 07/17/2011 1902   ALKPHOS 90 10/01/2017 0119   ALKPHOS 82 07/17/2011 1902   BILITOT 0.4 10/01/2017 0119   BILITOT 0.2 07/17/2011 1902    GFRNONAA >60 10/01/2017 0119   GFRNONAA >60 07/17/2011 1902   GFRAA >60 10/01/2017 0119   GFRAA >60 07/17/2011 1902    Lab Results  Component Value Date   WBC 12.8 (H) 01/31/2017   HGB 14.6 10/01/2017   HCT 43.0 10/01/2017   MCV 89.9 01/31/2017   PLT 390 01/31/2017   No results found for: "HGBA1C" No results found for: "CHOL", "HDL", "LDLCALC", "LDLDIRECT", "TRIG", "CHOLHDL" Lab Results  Component Value Date   TSH 1.900 02/01/2014    Review of Systems  Respiratory:  Negative for chest tightness, shortness of breath and wheezing.   Cardiovascular:  Positive for leg swelling. Negative for chest pain.  Musculoskeletal:  Positive for arthralgias and joint swelling.    Patient Active Problem List   Diagnosis Date Noted   Dyshidrotic eczema 07/16/2022   Nephrolithiasis 08/31/2020   Generalized anxiety disorder with panic attacks 02/01/2017   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[place]) 11/14/2015   Lumbar degenerative disc disease 05/25/2014   Chronic pain associated with significant psychosocial dysfunction 05/25/2014   Class 3 severe obesity with serious comorbidity and body mass index (BMI) of 40.0 to 44.9 in adult [facility]) 01/31/2014   COPD (chronic obstructive pulmonary disease) (HCC)    Chronic  leg pain    Chronic back pain    Hypovitaminosis D 08/10/2013   Insomnia 07/20/2013   Paroxysmal nocturnal dyspnea 07/20/2013   Neuralgia neuritis, sciatic nerve 08/25/2012   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/25/2012   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   Esophageal reflux 08/25/2012   Suicidal ideation 08/25/2012   History of TIA (transient ischemic attack) 08/25/2012   Polycystic ovaries 10/06/2008    Allergies  Allergen Reactions   Ceftriaxone Other (See Comments)    Tightness in chest, throat   Gabapentin Anaphylaxis   Naproxen Hives, Nausea And Vomiting and Other (See Comments)   Codeine Other (See Comments)   Ibuprofen Nausea  And Vomiting and Hives   Methocarbamol Nausea And Vomiting   Other Other (See Comments)   Meloxicam Hives   Gadolinium Derivatives Nausea And Vomiting    Past Surgical History:  Procedure Laterality Date   APPENDECTOMY      Social History   Tobacco Use   Smoking status: Some Days    Packs/day: 0.25    Years: 18.00    Additional pack years: 0.00    Total pack years: 4.50    Types: Cigarettes   Smokeless tobacco: Never  Vaping Use   Vaping Use: Never used  Substance Use Topics   Alcohol use: No   Drug use: Not Currently     Medication list has been reviewed and updated.  Current Meds  Medication Sig   albuterol (PROVENTIL) (2.5 MG/3ML) 0.083% nebulizer solution 2.5 mg.   ALPRAZolam (XANAX) 1 MG tablet Take 1 mg by mouth 3 (three) times daily.   cariprazine (VRAYLAR) 3 MG capsule Take 1 capsule (3 mg total) by mouth daily.   clindamycin (CLEOCIN) 300 MG capsule Take 300 mg by mouth as needed.   furosemide (LASIX) 20 MG tablet Take 20 mg by mouth daily.   medroxyPROGESTERone (DEPO-PROVERA) 150 MG/ML injection Inject 150 mg into the muscle every 3 (three) months.   naloxone (NARCAN) nasal spray 4 mg/0.1 mL Use one spray in either nostril once for known/suspected opioid overdose. May repeat every 2-3 minutes in alternating nostril til EMS arrives   nystatin (MYCOSTATIN/NYSTOP) powder    ondansetron (ZOFRAN-ODT) 4 MG disintegrating tablet Take by mouth.   QUEtiapine (SEROQUEL) 300 MG tablet Take 300 mg by mouth at bedtime.   Semaglutide-Weight Management (WEGOVY) 0.25 MG/0.5ML SOAJ Inject 0.25 mg into the skin once a week for 28 days. Be mindful to eat smaller portions and avoid high fat-content foods on this medication.   tamsulosin (FLOMAX) 0.4 MG CAPS capsule Take 1 capsule by mouth daily.   traMADol (ULTRAM) 50 MG tablet Take 2 tablets (100 mg total) by mouth 2 (two) times daily.   valACYclovir (VALTREX) 1000 MG tablet SMARTSIG:2 Tablet(s) By Mouth Every 12 Hours    [DISCONTINUED] triamcinolone cream (KENALOG) 0.1 % Apply topically 2 (two) times daily.       07/16/2022    4:15 PM 07/12/2022    9:53 AM  GAD 7 : Generalized Anxiety Score  Nervous, Anxious, on Edge 3 3  Control/stop worrying 3 3  Worry too much - different things 3 3  Trouble relaxing 3 3  Restless 3 3  Easily annoyed or irritable 1 3  Afraid - awful might happen 2 3  Total GAD 7 Score 18 21  Anxiety Difficulty Extremely difficult Extremely difficult       07/16/2022    4:14 PM 07/12/2022    9:53 AM 07/27/2014  2:29 PM  Depression screen PHQ 2/9  Decreased Interest 2 2 1   Down, Depressed, Hopeless 2 2 2   PHQ - 2 Score 4 4 3   Altered sleeping 2 3 2   Tired, decreased energy 2 2 2   Change in appetite 1 3 2   Feeling bad or failure about yourself  2 3 1   Trouble concentrating 2 2 0  Moving slowly or fidgety/restless 2 2 0  Suicidal thoughts 0 0 0  PHQ-9 Score 15 19 10   Difficult doing work/chores Extremely dIfficult Extremely dIfficult     BP Readings from Last 3 Encounters:  07/16/22 124/70  07/12/22 134/62  10/21/19 107/77    Physical Exam Vitals and nursing note reviewed.  Constitutional:      Appearance: Normal appearance.  Cardiovascular:     Rate and Rhythm: Normal rate and regular rhythm.     Heart sounds: Normal heart sounds. No murmur heard.    No friction rub. No gallop.     Comments: Trace edema of bilateral feet/ankles.  Radial and pedal pulses 2+ bilaterally, capillary refill less than 2 seconds. No significant pallor on left vs right.  Pulmonary:     Effort: Pulmonary effort is normal.     Breath sounds: Normal breath sounds.  Musculoskeletal:     Right lower leg: Edema present.     Left lower leg: Edema present.  Feet:     Comments: Examination of the left foot reveals significant tenderness at all 5 MTPs with limited PROM due to pain.  Overlying the MTPs is a subtle blue-purple discoloration possibly reflecting ecchymosis versus vascular  change.    Wt Readings from Last 3 Encounters:  07/16/22 265 lb 12.8 oz (120.6 kg)  07/12/22 262 lb (118.8 kg)  10/21/19 258 lb (117 kg)    BP 124/70   Pulse 98   Temp 98.1 F (36.7 C) (Oral)   Ht 5\' 6"  (1.676 m)   Wt 265 lb 12.8 oz (120.6 kg)   SpO2 98%   BMI 42.90 kg/m   Assessment and Plan:  1. Foot pain, left Interesting presentation, Ddx includes rheumatoid, gout, embolic/vascular pain. Discouraged smoking. STAT XR today reveals essentially normal L foot aside from "soft tissue swelling over the dorsum"  Hx rash with multiple NSAIDs so will hold off on oral/topical NSAIDs - recommended Aspercreme instead for the pain.   I am considering referral to vascular, especially if recurrent problem. Will refer if patient agreeable - message currently pending regarding XR results and plan, awaiting response from patient  - DG Foot Complete Left  2. Dyshidrotic eczema Refill Kenalog cream for the hands - triamcinolone cream (KENALOG) 0.1 %; Apply topically 2 (two) times daily. Apply to hands for dyshidrotic eczema (AKA pompholyx)  Dispense: 30 g; Refill: 2    For now, f/u PRN. Currently scheduled to see me 08/02/22 but may make appt sooner depending on symptom progression/recurrence. Consider rheum panel next labs.   Partially dictated using Editor, commissioning. Any errors are unintentional.  Lupita Leash, PA-C, [facility]

## 2022-07-17 ENCOUNTER — Telehealth: Payer: Self-pay

## 2022-07-17 NOTE — Telephone Encounter (Signed)
Tried contacting patient and unable to leave a VM or reach patient at this time.  Denise Ellis

## 2022-07-17 NOTE — Telephone Encounter (Signed)
-----   Message from Jeannene Patella, [place] sent at 07/17/2022 10:10 AM EDT ----- Regarding: Attempt to Call I tried to call her once last night and twice this morning but it just goes straight to voicemail and we cannot leave a message.   1. Please let her know her voice mailbox is full and she will need to clear it out some so that people can leave messages 2. I have her xray results for the left foot. Basically a normal xray with no fractures but she has some soft tissue swelling along the top of the foot. She also has a tiny heel spur which is not related to her current pain.  3. I spoke with one of the physicians here and we think it would be a good idea to refer for vascular consult in case there is an issue with blood flow. In particular, if the pain improves when seated/foot down, and gets worse when lying down/foot up, this could suggest an issue with the arteries. If patient agrees with referral I will go ahead and get it going. If the severe pain happens again it may be best to complete workup at ED.

## 2022-07-23 ENCOUNTER — Other Ambulatory Visit: Payer: Self-pay | Admitting: Physician Assistant

## 2022-07-23 NOTE — Telephone Encounter (Signed)
Medication Refill - Medication:  QUEtiapine (SEROQUEL) 300 MG tablet , medroxyPROGESTERone (DEPO-PROVERA) 150 MG/ML injection   Has the patient contacted their pharmacy? Yes.     Preferred Pharmacy (with phone number or street name):  [facility] [address] Phone: [phone]  Fax: [phone]     Has the patient been seen for an appointment in the last year OR does the patient have an upcoming appointment? Yes.    Please assist patient further

## 2022-07-24 MED ORDER — MEDROXYPROGESTERONE ACETATE 150 MG/ML IM SUSP: 150.0000 mg | INTRAMUSCULAR | 1 refills | Status: DC

## 2022-07-24 MED ORDER — QUETIAPINE FUMARATE 300 MG PO TABS: 300.0000 mg | ORAL_TABLET | Freq: Every day | ORAL | 1 refills | Status: DC

## 2022-07-24 NOTE — Telephone Encounter (Signed)
Please review. Are you taking over these medications?  KP

## 2022-07-24 NOTE — Telephone Encounter (Signed)
Requested medication (s) are due for refill today: yes  Requested medication (s) are on the active medication list: yes  Last refill:  07/12/22  Future visit scheduled: yes  Notes to clinic:  Unable to refill per protocol, last refill by another provider. Historical provider     Requested Prescriptions  Pending Prescriptions Disp Refills   medroxyPROGESTERone (DEPO-PROVERA) 150 MG/ML injection 1 mL     Sig: Inject 1 mL (150 mg total) into the muscle every 3 (three) months.     Off-Protocol Failed - 07/23/2022  3:42 PM      Failed - Medication not assigned to a protocol, review manually.      Passed - Valid encounter within last 12 months    Recent Outpatient Visits           1 week ago Foot pain, left   [facility] at [place], [place]   1 week ago Chronic pain associated with significant psychosocial dysfunction   [facility] at [facility], [place], [place]       Future Appointments             In 1 week Saverio Danker [place], [place] [facility] at [facility]             QUEtiapine (SEROQUEL) 300 MG tablet      Sig: Take 1 tablet (300 mg total) by mouth at bedtime.     Not Delegated - Psychiatry:  Antipsychotics - Second Generation (Atypical) - quetiapine Failed - 07/23/2022  3:42 PM      Failed - This refill cannot be delegated      Failed - TSH in normal range and within 360 days    TSH  Date Value Ref Range Status  02/01/2014 1.900 0.350 - 4.500 uIU/mL Final    Comment:    Performed at [facility]         Failed - Lipid Panel in normal range within the last 12 months    No results found for: "CHOL", "POCCHOL", "CHOLTOT" No results found for: "LDLCALC", "LDLC", "HIRISKLDL", "POCLDL", "LDLDIRECT", "REALLDLC", "TOTLDLC" No results found for: "HDL", "POCHDL" No results found for: "TRIG", "POCTRIG"       Failed - CBC within  normal limits and completed in the last 12 months    WBC  Date Value Ref Range Status  01/31/2017 12.8 (H) 4.0 - 10.5 K/uL Final   RBC  Date Value Ref Range Status  01/31/2017 4.57 3.87 - 5.11 MIL/uL Final   Hemoglobin  Date Value Ref Range Status  10/01/2017 14.6 12.0 - 15.0 g/dL Final   HGB  Date Value Ref Range Status  07/17/2011 11.2 (L) 12.0 - 16.0 g/dL Final   HCT  Date Value Ref Range Status  10/01/2017 43.0 36.0 - 46.0 % Final  07/17/2011 35.1 35.0 - 47.0 % Final   MCHC  Date Value Ref Range Status  01/31/2017 31.1 30.0 - 36.0 g/dL Final   [facility]  Date Value Ref Range Status  01/31/2017 28.0 26.0 - 34.0 pg Final   MCV  Date Value Ref Range Status  01/31/2017 89.9 78.0 - 100.0 fL Final  07/17/2011 83 80 - 100 fL Final   No results found for: "PLTCOUNTKUC", "LABPLAT", "POCPLA" RDW  Date Value Ref Range Status  01/31/2017 15.2 11.5 - 15.5 % Final  07/17/2011 16.1 (H) 11.5 - 14.5 %  Final         Failed - CMP within normal limits and completed in the last 12 months    Albumin  Date Value Ref Range Status  10/01/2017 3.8 3.5 - 5.0 g/dL Final  07/17/2011 3.6 3.4 - 5.0 g/dL Final   Alkaline Phosphatase  Date Value Ref Range Status  10/01/2017 90 38 - 126 U/L Final  07/17/2011 82 50 - 136 Unit/L Final   ALT  Date Value Ref Range Status  10/01/2017 14 14 - 54 U/L Final   SGPT (ALT)  Date Value Ref Range Status  07/17/2011 22 U/L Final    Comment:    12-78 NOTE: NEW REFERENCE RANGE 03/16/2011    AST  Date Value Ref Range Status  10/01/2017 16 15 - 41 U/L Final   SGOT(AST)  Date Value Ref Range Status  07/17/2011 18 15 - 37 Unit/L Final   BUN  Date Value Ref Range Status  10/01/2017 10 6 - 20 mg/dL Final  07/17/2011 5 (L) 7 - 18 mg/dL Final   Calcium  Date Value Ref Range Status  10/01/2017 9.2 8.9 - 10.3 mg/dL Final   Calcium, Total  Date Value Ref Range Status  07/17/2011 8.8 8.5 - 10.1 mg/dL Final   Calcium, Ion  Date Value Ref  Range Status  10/01/2017 0.98 (L) 1.15 - 1.40 mmol/L Final   CO2  Date Value Ref Range Status  10/01/2017 20 (L) 22 - 32 mmol/L Final   Co2  Date Value Ref Range Status  07/17/2011 25 21 - 32 mmol/L Final   TCO2  Date Value Ref Range Status  10/01/2017 22 22 - 32 mmol/L Final   Creatinine  Date Value Ref Range Status  07/17/2011 0.69 0.60 - 1.30 mg/dL Final   Creatinine, Ser  Date Value Ref Range Status  10/01/2017 0.60 0.44 - 1.00 mg/dL Final   Creatinine, Urine  Date Value Ref Range Status  07/28/2014 157.26 >20.0 mg/dL Final   Glucose  Date Value Ref Range Status  07/17/2011 92 65 - 99 mg/dL Final   Glucose, Bld  Date Value Ref Range Status  10/01/2017 78 65 - 99 mg/dL Final   Potassium  Date Value Ref Range Status  10/01/2017 3.8 3.5 - 5.1 mmol/L Final  07/17/2011 3.7 3.5 - 5.1 mmol/L Final   Sodium  Date Value Ref Range Status  10/01/2017 138 135 - 145 mmol/L Final  07/17/2011 145 136 - 145 mmol/L Final   Total Bilirubin  Date Value Ref Range Status  10/01/2017 0.4 0.3 - 1.2 mg/dL Final   Bilirubin,Total  Date Value Ref Range Status  07/17/2011 0.2 0.2 - 1.0 mg/dL Final   Bilirubin, Direct  Date Value Ref Range Status  06/15/2014 <0.1 0.0 - 0.5 mg/dL Final   Indirect Bilirubin  Date Value Ref Range Status  06/15/2014 NOT CALCULATED 0.3 - 0.9 mg/dL Final   Protein, ur  Date Value Ref Range Status  10/01/2017 30 (A) NEGATIVE mg/dL Final   Total Protein  Date Value Ref Range Status  10/01/2017 7.3 6.5 - 8.1 g/dL Final  07/17/2011 7.3 6.4 - 8.2 g/dL Final   EGFR (African American)  Date Value Ref Range Status  07/17/2011 >60 >57mL/min Final   GFR calc Af Amer  Date Value Ref Range Status  10/01/2017 >60 >60 mL/min Final    Comment:    (NOTE) The eGFR has been calculated using the CKD EPI equation. This calculation has not been validated in all clinical situations.  eGFR's persistently <60 mL/min signify possible Chronic  Kidney Disease.    EGFR (Non-African Amer.)  Date Value Ref Range Status  07/17/2011 >60 >98mL/min Final    Comment:    eGFR values <10mL/min/1.73 m2 may be an indication of chronic kidney disease (CKD). Calculated eGFR, using the MRDR Study equation, is useful in  patients with stable renal function. The eGFR calculation will not be reliable in acutely ill patients when serum creatinine is changing rapidly. It is not useful in patients on dialysis. The eGFR calculation may not be applicable to patients at the low and high extremes of body sizes, pregnant women, and vegetarians.    GFR calc non Af Amer  Date Value Ref Range Status  10/01/2017 >60 >60 mL/min Final         Passed - Completed PHQ-2 or PHQ-9 in the last 360 days      Passed - Last BP in normal range    BP Readings from Last 1 Encounters:  07/16/22 124/70         Passed - Last Heart Rate in normal range    Pulse Readings from Last 1 Encounters:  07/16/22 98         Passed - Valid encounter within last 6 months    Recent Outpatient Visits           1 week ago Foot pain, left   [facility] at [place], [place]   1 week ago Chronic pain associated with significant psychosocial dysfunction   [facility] at [facility], [place]       Future Appointments             In 1 week Saverio Danker, [place], [place] at [facility], [facility]

## 2022-07-31 ENCOUNTER — Telehealth: Payer: Self-pay | Admitting: Physician Assistant

## 2022-07-31 NOTE — Telephone Encounter (Signed)
Copied from CRM [id number]. Topic: Medicare AWV >> Jul 31, 2022  9:52 AM Payton Doughty wrote: Reason for CRM: Called patient to schedule Medicare Annual Wellness Visit (AWV). No voicemail available to leave a message.  Last date of AWV: NONE  Please schedule an appointment at any time with Kennedy Bucker, LPN  .  If any questions, please contact me.  Thank you ,  Verlee Rossetti; Care Guide Ambulatory Clinical Support [place] [facility] Direct Dial: [phone]

## 2022-08-02 ENCOUNTER — Ambulatory Visit: Payer: 59 | Admitting: Physician Assistant

## 2022-08-09 ENCOUNTER — Telehealth: Payer: Self-pay

## 2022-08-09 NOTE — Telephone Encounter (Signed)
Denied.

## 2022-08-09 NOTE — Telephone Encounter (Signed)
PA completed waiting on insurance approval.  Key: [id number]  [id number]

## 2022-08-17 ENCOUNTER — Ambulatory Visit: Payer: 59 | Admitting: Physician Assistant

## 2022-08-28 ENCOUNTER — Ambulatory Visit: Payer: 59 | Admitting: Physician Assistant

## 2022-08-28 ENCOUNTER — Telehealth: Payer: Self-pay | Admitting: Physician Assistant

## 2022-08-28 NOTE — Progress Notes (Deleted)
Date:  08/28/2022   Name:  Denise Ellis   DOB:  [birth year]   MRN:  [id number]   Chief Complaint: No chief complaint on file.  HPI  Last depo 05/15/22  Medication list has been reviewed and updated.  No outpatient medications have been marked as taking for the 08/28/22 encounter (Appointment) with Remo Lipps, PA.     Review of Systems  Patient Active Problem List   Diagnosis Date Noted   Dyshidrotic eczema 07/16/2022   Nephrolithiasis 08/31/2020   Generalized anxiety disorder with panic attacks 02/01/2017   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HCC) 11/14/2015   Lumbar degenerative disc disease 05/25/2014   Chronic pain associated with significant psychosocial dysfunction 05/25/2014   Class 3 severe obesity with serious comorbidity and body mass index (BMI) of 40.0 to 44.9 in adult [facility]) 01/31/2014   COPD (chronic obstructive pulmonary disease) (HCC)    Chronic leg pain    Chronic back pain    Hypovitaminosis D 08/10/2013   Insomnia 07/20/2013   Paroxysmal nocturnal dyspnea 07/20/2013   Neuralgia neuritis, sciatic nerve 08/25/2012   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   Esophageal reflux 08/25/2012   Suicidal ideation 08/25/2012   History of TIA (transient ischemic attack) 08/25/2012   Polycystic ovaries 10/06/2008    Allergies  Allergen Reactions   Ceftriaxone Other (See Comments)    Tightness in chest, throat   Gabapentin Anaphylaxis   Naproxen Hives, Nausea And Vomiting and Other (See Comments)   Codeine Other (See Comments)   Ibuprofen Nausea And Vomiting and Hives   Methocarbamol Nausea And Vomiting   Other Other (See Comments)   Meloxicam Hives   Gadolinium Derivatives Nausea And Vomiting    Immunization History  Administered Date(s) Administered   Tdap 05/28/2007    Past Surgical History:  Procedure Laterality Date   APPENDECTOMY      Social  History   Tobacco Use   Smoking status: Some Days    Packs/day: 0.50    Years: 18.00    Additional pack years: 0.00    Total pack years: 9.00    Types: Cigarettes   Smokeless tobacco: Never  Vaping Use   Vaping Use: Never used  Substance Use Topics   Alcohol use: No   Drug use: Not Currently       07/16/2022    4:15 PM 07/12/2022    9:53 AM  GAD 7 : Generalized Anxiety Score  Nervous, Anxious, on Edge 3 3  Control/stop worrying 3 3  Worry too much - different things 3 3  Trouble relaxing 3 3  Restless 3 3  Easily annoyed or irritable 1 3  Afraid - awful might happen 2 3  Total GAD 7 Score 18 21  Anxiety Difficulty Extremely difficult Extremely difficult       07/16/2022    4:14 PM 07/12/2022    9:53 AM 07/27/2014    2:29 PM  Depression screen PHQ 2/9  Decreased Interest 2 2 1   Down, Depressed, Hopeless 2 2 2   PHQ - 2 Score 4 4 3   Altered sleeping 2 3 2   Tired, decreased energy 2 2 2   Change in appetite 1 3 2   Feeling bad or failure about yourself  2 3 1   Trouble concentrating 2 2 0  Moving slowly or fidgety/restless 2 2 0  Suicidal thoughts 0 0 0  PHQ-9 Score 15 19 10   Difficult  doing work/chores Extremely dIfficult Extremely dIfficult     BP Readings from Last 3 Encounters:  07/16/22 124/70  07/12/22 134/62  10/21/19 107/77    Wt Readings from Last 3 Encounters:  07/16/22 265 lb 12.8 oz (120.6 kg)  07/12/22 262 lb (118.8 kg)  10/21/19 258 lb (117 kg)    There were no vitals taken for this visit.  Physical Exam  Recent Labs     Component Value Date/Time   NA 143 08/19/2020 0000   NA 145 07/17/2011 1902   K 4.8 08/19/2020 0000   K 3.7 07/17/2011 1902   CL 104 08/19/2020 0000   CL 109 (H) 07/17/2011 1902   CO2 17 08/19/2020 0000   CO2 25 07/17/2011 1902   GLUCOSE 78 10/01/2017 0208   GLUCOSE 92 07/17/2011 1902   BUN 10 08/19/2020 0000   BUN 5 (L) 07/17/2011 1902   CREATININE 0.8 08/19/2020 0000   CREATININE 0.60 10/01/2017 0208    CREATININE 0.69 07/17/2011 1902   CALCIUM 8.9 08/19/2020 0000   CALCIUM 8.8 07/17/2011 1902   PROT 7.3 10/01/2017 0119   PROT 7.3 07/17/2011 1902   ALBUMIN 4.2 08/19/2020 0000   ALBUMIN 3.6 07/17/2011 1902   AST 13 08/19/2020 0000   AST 18 07/17/2011 1902   ALT 10 08/19/2020 0000   ALT 22 07/17/2011 1902   ALKPHOS 101 08/19/2020 0000   ALKPHOS 82 07/17/2011 1902   BILITOT 0.4 10/01/2017 0119   BILITOT 0.2 07/17/2011 1902   GFRNONAA >60 10/01/2017 0119   GFRNONAA >60 07/17/2011 1902   GFRAA >60 10/01/2017 0119   GFRAA >60 07/17/2011 1902    Lab Results  Component Value Date   WBC 12.8 (H) 01/31/2017   HGB 14.6 10/01/2017   HCT 43.0 10/01/2017   MCV 89.9 01/31/2017   PLT 390 01/31/2017   No results found for: "HGBA1C" No results found for: "CHOL", "HDL", "LDLCALC", "LDLDIRECT", "TRIG", "CHOLHDL" Lab Results  Component Value Date   TSH 1.900 02/01/2014     Assessment and Plan:  There are no diagnoses linked to this encounter.   No follow-ups on file.   Partially dictated using Animal nutritionist. Any errors are unintentional.  Alvester Morin, PA-C, DMSc, Nutritionist [facility] [phone]

## 2022-08-28 NOTE — Telephone Encounter (Signed)
Pt is calling in because she has an appointment on 08/29/22 at 4 PM. Pt has some concerns about boils under breast. Pt says they are sore and under both breast but the right one is the worst. Pt says she is concerned because the boils are very deep and she's never had a mammogram. Pt wanted to know could this be addressed at her appointment tomorrow and would it be possible for her to get referred for a mammogram.

## 2022-08-29 ENCOUNTER — Ambulatory Visit (INDEPENDENT_AMBULATORY_CARE_PROVIDER_SITE_OTHER): Payer: 59 | Admitting: Physician Assistant

## 2022-08-29 ENCOUNTER — Encounter: Payer: Self-pay | Admitting: Physician Assistant

## 2022-08-29 VITALS — BP 122/76 | HR 104 | Temp 98.1°F | Ht 66.0 in | Wt 252.0 lb

## 2022-08-29 DIAGNOSIS — Z6841 Body Mass Index (BMI) 40.0 and over, adult: Secondary | ICD-10-CM

## 2022-08-29 DIAGNOSIS — G8929 Other chronic pain: Secondary | ICD-10-CM | POA: Diagnosis not present

## 2022-08-29 DIAGNOSIS — N62 Hypertrophy of breast: Secondary | ICD-10-CM

## 2022-08-29 DIAGNOSIS — Z3042 Encounter for surveillance of injectable contraceptive: Secondary | ICD-10-CM

## 2022-08-29 DIAGNOSIS — M5442 Lumbago with sciatica, left side: Secondary | ICD-10-CM | POA: Diagnosis not present

## 2022-08-29 DIAGNOSIS — M5441 Lumbago with sciatica, right side: Secondary | ICD-10-CM | POA: Diagnosis not present

## 2022-08-29 DIAGNOSIS — L304 Erythema intertrigo: Secondary | ICD-10-CM | POA: Diagnosis not present

## 2022-08-29 DIAGNOSIS — G894 Chronic pain syndrome: Secondary | ICD-10-CM

## 2022-08-29 DIAGNOSIS — F411 Generalized anxiety disorder: Secondary | ICD-10-CM

## 2022-08-29 DIAGNOSIS — F410 Panic disorder [episodic paroxysmal anxiety] without agoraphobia: Secondary | ICD-10-CM

## 2022-08-29 DIAGNOSIS — N809 Endometriosis, unspecified: Secondary | ICD-10-CM

## 2022-08-29 DIAGNOSIS — Z1231 Encounter for screening mammogram for malignant neoplasm of breast: Secondary | ICD-10-CM

## 2022-08-29 DIAGNOSIS — Z3202 Encounter for pregnancy test, result negative: Secondary | ICD-10-CM

## 2022-08-29 DIAGNOSIS — L301 Dyshidrosis [pompholyx]: Secondary | ICD-10-CM

## 2022-08-29 LAB — POCT URINE PREGNANCY: Preg Test, Ur: NEGATIVE

## 2022-08-29 MED ORDER — TRIAMCINOLONE ACETONIDE 0.1 % EX CREA
TOPICAL_CREAM | Freq: Two times a day (BID) | CUTANEOUS | 5 refills | Status: DC
Start: 2022-08-29 — End: 2023-01-28

## 2022-08-29 MED ORDER — MEDROXYPROGESTERONE ACETATE 150 MG/ML IM SUSY
150.0000 mg | PREFILLED_SYRINGE | INTRAMUSCULAR | Status: DC
Start: 2022-08-29 — End: 2022-09-05
  Administered 2022-08-29: 150 mg via INTRAMUSCULAR

## 2022-08-29 MED ORDER — NYSTATIN 100000 UNIT/GM EX CREA
1.00 | TOPICAL_CREAM | Freq: Two times a day (BID) | CUTANEOUS | 0 refills | Status: DC
Start: 2022-08-29 — End: 2022-10-30

## 2022-08-29 MED ORDER — ALPRAZOLAM 1 MG PO TABS
1.0000 mg | ORAL_TABLET | Freq: Three times a day (TID) | ORAL | 2 refills | Status: DC
Start: 2022-09-04 — End: 2022-11-26
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
## 2022-08-29 NOTE — Patient Instructions (Signed)
-  It was a pleasure to see you today! Please review your visit summary for helpful information -I would encourage you to follow your care via MyChart where you can access lab results, notes, messages, and more -If you feel that we did a nice job today, please complete your after-visit survey and leave [place] a Google review! Your CMA today was Mariann Barter and your provider was Alvester Morin, PA-C, DMSc -Please return for follow-up in about 1 month for complete physical with pap

## 2022-08-30 ENCOUNTER — Other Ambulatory Visit: Payer: Self-pay

## 2022-08-30 ENCOUNTER — Encounter: Payer: Self-pay | Admitting: Physician Assistant

## 2022-08-30 ENCOUNTER — Telehealth: Payer: Self-pay

## 2022-08-30 ENCOUNTER — Other Ambulatory Visit: Payer: Self-pay | Admitting: Physician Assistant

## 2022-08-30 DIAGNOSIS — N62 Hypertrophy of breast: Secondary | ICD-10-CM | POA: Insufficient documentation

## 2022-08-30 DIAGNOSIS — Z87410 Personal history of cervical dysplasia: Secondary | ICD-10-CM | POA: Insufficient documentation

## 2022-08-30 DIAGNOSIS — G894 Chronic pain syndrome: Secondary | ICD-10-CM

## 2022-08-30 DIAGNOSIS — N809 Endometriosis, unspecified: Secondary | ICD-10-CM | POA: Insufficient documentation

## 2022-08-30 MED ORDER — WEGOVY 0.25 MG/0.5ML ~~LOC~~ SOAJ
0.2500 mg | SUBCUTANEOUS | 0 refills | Status: AC
Start: 2022-08-30 — End: 2022-09-27

## 2022-08-30 NOTE — Telephone Encounter (Signed)
Reminder letter mailed to the patient.

## 2022-08-30 NOTE — Telephone Encounter (Signed)
Requested medication (s) are due for refill today: yes  Requested medication (s) are on the active medication list: yes  Last refill:  07/12/22  Future visit scheduled: yes  Notes to clinic:  Unable to refill per protocol, cannot delegate.      Requested Prescriptions  Pending Prescriptions Disp Refills   traMADol (ULTRAM) 50 MG tablet [Pharmacy Med Name: TRAMADOL 50MG  TABLETS] 60 tablet     Sig: TAKE 2 TABLETS(100 MG) BY MOUTH TWICE DAILY     Not Delegated - Analgesics:  Opioid Agonists Failed - 08/30/2022  2:17 PM      Failed - This refill cannot be delegated      Failed - Urine Drug Screen completed in last 360 days      Passed - Valid encounter within last 3 months    Recent Outpatient Visits           Yesterday Depo-Provera contraceptive status   [place] [facility] at [facility], Melton Alar, PA   1 month ago Foot pain, left   [facility] at [facility], Melton Alar, PA   1 month ago Chronic pain associated with significant psychosocial dysfunction   [facility] Medicine at [facility], Melton Alar, PA       Future Appointments             In 1 month [name], Melton Alar, [facility] Medicine at [facility], [facility]

## 2022-08-30 NOTE — Telephone Encounter (Signed)
Please advise 

## 2022-08-30 NOTE — Telephone Encounter (Signed)
PA completed waiting on insurance approval.  Key: [id number]  KP

## 2022-08-31 ENCOUNTER — Telehealth: Payer: Self-pay | Admitting: Physician Assistant

## 2022-08-31 ENCOUNTER — Telehealth: Payer: Self-pay

## 2022-08-31 NOTE — Telephone Encounter (Signed)
Pt called the office and requested to talk to me. PEC went over message with the patient. Patient asked about tramadol and or medication for nausea because she is throwing up and have diarrhea due to not having pain medication. Told pt we can not send in the medication. Pt asked if Jesusita Oka could taper her office the medication. Restated that we can not send in the medication. Told pt a referral was placed and that someone should call her to schedule. Gave pt the number to [facility] to see if she can schedule an appointment to be seen. A referral was placed there. Told pt she can go tot he ER or UC if she needed any pain medication. Pt stated "I am not going there to wait 15 hours to be seen".  KP

## 2022-08-31 NOTE — Telephone Encounter (Addendum)
Pt called in says med, Semaglutide-Weight Management (WEGOVY) 0.25  is not being approved, needs PA. Please cab for further assistance

## 2022-08-31 NOTE — Telephone Encounter (Signed)
See the Advice Only encounter today with a MyChart message from the provider. Document in that encounter if the patient returns the call.

## 2022-08-31 NOTE — Telephone Encounter (Signed)
Copied from CRM [id number]. Topic: General - Other >> Aug 31, 2022 11:40 AM Turkey B wrote: Reason for CRM: pt requesting cb from Dr [name], wouldn't say what for. Please cb

## 2022-08-31 NOTE — Telephone Encounter (Signed)
Medication requested is not on med list. Please review for refill.

## 2022-08-31 NOTE — Telephone Encounter (Signed)
Denied  KP 

## 2022-08-31 NOTE — Telephone Encounter (Signed)
Medication Refill - Medication: tramadol, she isnt sure of mg  Has the patient contacted their pharmacy? yes (Agent: If no, request that the patient contact the pharmacy for the refill. If patient does not wish to contact the pharmacy document the reason why and proceed with request.) (Agent: If yes, when and what did the pharmacy advise?)contact pcp  Preferred Pharmacy (with phone number or street name):  [id number] - [place], [place] - [address] AT [facility] OF [address] [facility] Phone: [phone]  Fax: [phone]     Has the patient been seen for an appointment in the last year OR does the patient have an upcoming appointment? yes  Agent: Please be advised that RX refills may take up to 3 business days. We ask that you follow-up with your pharmacy.

## 2022-08-31 NOTE — Telephone Encounter (Signed)
Denise Ellis has been denied. We do not think we can get the medication approved. We are going to see if a medication called zepbound would be covered. There is a chance this medication will not be approved.  KP

## 2022-08-31 NOTE — Telephone Encounter (Signed)
Hi Denise Ellis,   I am so sorry you are in so much pain, I know you have a ton on your plate with [facility] surgery and all, but I simply cannot prescribe tramadol for your pain moving forward. I was very clear about this in March while we tried to get you lined up with Pain Management Dr. Welton Flakes, and made a special one-time exception to bridge the gap. I tried to call you to follow up after our visit, but could not reach you by phone and your voicemail was full.    We resubmitted the referral today as an urgent status, but I cannot give any information as to how long it will take to be scheduled. I do know it will be at least two weeks as that provider is out of office. You will have to make any remaining tramadol last. I am willing to prescribe non-controlled medications for you but tramadol is not an option from a primary care standpoint.    I do also recognize that we have tried and failed many medicines. It is difficult because you seem to have allergies or intolerance to almost every pain reliever that is not an opioid. This basically leaves [place] with changing/altering antidepressant or relying on topicals like lidocaine patches, IcyHot, Voltaren, Biofreeze, Aspercreme. Again I know you've done most of this but that is what we are left with. You have Vraylar samples for the depression, which conceivably might help with pain but no promises.    I wish I could help more but my hands are tied.    Please let me know if you need me to call anything in that is not a controlled substance.    Alvester Morin, PA-C, DMSc, Nutritionist [facility] Primary Care and Sports Medicine [facility] [phone]

## 2022-08-31 NOTE — Telephone Encounter (Signed)
Called pt left VM there are a few messages for this patient.  KP

## 2022-08-31 NOTE — Telephone Encounter (Signed)
Called pt left VM to call back. Pt was sent this message on Mychart. Please read this message to the patient.  We are not able to give pt tramadol.   PEC may give results if patient returns call - CRM created.  KP

## 2022-08-31 NOTE — Telephone Encounter (Signed)
Provider has discussed with the patient at her last visit that we can not prescribed her tramadol.  PEC may give results if patient returns call - CRM created.  KP

## 2022-08-31 NOTE — Telephone Encounter (Signed)
Patient called and advised of the message below by Reuel Boom. She says that she was supposed to be in pain management 6 weeks ago. She says she's on strong antidepressants and it's not helping with the pain, what else can she do. She's says she's upset this wasn't done 6 weeks ago and now she's paying the price for it being in pain, diarrhea, nausea. She says if he can call something in that's not an opioid that will help with all the symptoms, then that would be great. Advised ED if symptoms worsen over the weekend, she says they will not do anything either. Advised I will send this to Reuel Boom for review.

## 2022-09-03 ENCOUNTER — Other Ambulatory Visit: Payer: Self-pay | Admitting: Physician Assistant

## 2022-09-03 DIAGNOSIS — R112 Nausea with vomiting, unspecified: Secondary | ICD-10-CM

## 2022-09-03 MED ORDER — ONDANSETRON 8 MG PO TBDP
8.0000 mg | ORAL_TABLET | Freq: Three times a day (TID) | ORAL | 1 refills | Status: DC | PRN
Start: 2022-09-03 — End: 2022-11-26

## 2022-09-03 NOTE — Telephone Encounter (Signed)
Called pt eft VM to call back.  Called to see how the patient was doing. She is still having diarrhea and nausea. Asked pt has she tried anything over the counter.Pt stated she took " some white pill over the counter for nausea I don't know the name of it." Told pt I would speak to Jesusita Oka to see if there is anything that can be taken for diarrhea and nausea. Pt abruptly hung up the phone.  KP

## 2022-09-03 NOTE — Telephone Encounter (Signed)
Called pt left her know zofran was sent in. Pt verbalized understanding. Pt asked for methadone told pt we could not prescribe that for her.  KP

## 2022-09-05 ENCOUNTER — Other Ambulatory Visit: Payer: Self-pay | Admitting: Physician Assistant

## 2022-09-05 DIAGNOSIS — F410 Panic disorder [episodic paroxysmal anxiety] without agoraphobia: Secondary | ICD-10-CM

## 2022-09-05 DIAGNOSIS — Z3042 Encounter for surveillance of injectable contraceptive: Secondary | ICD-10-CM | POA: Insufficient documentation

## 2022-09-05 MED ORDER — MEDROXYPROGESTERONE ACETATE 150 MG/ML IM SUSY
150.0000 mg | PREFILLED_SYRINGE | INTRAMUSCULAR | Status: DC
Start: 2022-11-27 — End: 2024-02-03
  Administered 2023-01-28: 150 mg via INTRAMUSCULAR

## 2022-09-05 NOTE — Telephone Encounter (Signed)
Medication Refill - Medication: ALPRAZolam (XANAX) 1 MG tablet   Has the patient contacted their pharmacy? Yes.    Preferred Pharmacy (with phone number or street name):  [id number] - [place], [place] - [address] AT [facility] OF [address] [facility] Phone: [phone]  Fax: [phone]     Has the patient been seen for an appointment in the last year OR does the patient have an upcoming appointment? Yes.    Agent: Please be advised that RX refills may take up to 3 business days. We ask that you follow-up with your pharmacy.  Patient stated she will be out of town and need her meds today

## 2022-09-05 NOTE — Telephone Encounter (Signed)
Requested medication (s) are due for refill today: no  Requested medication (s) are on the active medication list: yes  Last refill:  08/29/22 #90/2  Future visit scheduled: yes  Notes to clinic:  not delegated, pt can pick up tomorrow morning after 0800 per [place].     Requested Prescriptions  Pending Prescriptions Disp Refills   ALPRAZolam (XANAX) 1 MG tablet 90 tablet 2    Sig: Take 1 tablet (1 mg total) by mouth 3 (three) times daily.     Not Delegated - Psychiatry: Anxiolytics/Hypnotics 2 Failed - 09/05/2022  1:27 PM      Failed - This refill cannot be delegated      Failed - Urine Drug Screen completed in last 360 days      Passed - Patient is not pregnant      Passed - Valid encounter within last 6 months    Recent Outpatient Visits           1 week ago Depo-Provera contraceptive status   [place] [facility] at [facility], [place], [place]   1 month ago Foot pain, left   [facility] Primary Care & Sports Medicine at [facility], [place], [place]   1 month ago Chronic pain associated with significant psychosocial dysfunction   [facility] Primary Care & Sports Medicine at [facility], [place], [place]       Future Appointments             In 1 month [name], [place] Alar, [place] [facility] Primary Care & Sports Medicine at [facility], [facility]

## 2022-09-05 NOTE — Addendum Note (Signed)
Addended by: Remo Lipps on: 09/05/2022 12:07 PM   Modules accepted: Orders

## 2022-09-05 NOTE — Telephone Encounter (Signed)
[facility] called and spoke to Denise Ellis, [facility] about the refill(s) Xanax 1mg  requested. Advised it was sent on 08/29/22 #90/2 refill(s). He states that it is being filled and will be ready for pick up tomorrow after 0800 due to too soon today to pick up. No further assistance noted.    Requested Prescriptions  Pending Prescriptions Disp Refills   ALPRAZolam (XANAX) 1 MG tablet 90 tablet 2    Sig: Take 1 tablet (1 mg total) by mouth 3 (three) times daily.     Not Delegated - Psychiatry: Anxiolytics/Hypnotics 2 Failed - 09/05/2022  1:27 PM      Failed - This refill cannot be delegated      Failed - Urine Drug Screen completed in last 360 days      Passed - Patient is not pregnant      Passed - Valid encounter within last 6 months    Recent Outpatient Visits           1 week ago Depo-Provera contraceptive status   [place] [facility] at [facility], [place], [place]   1 month ago Foot pain, left   [facility] Primary Care & Sports Medicine at [facility], [place], [place]   1 month ago Chronic pain associated with significant psychosocial dysfunction   [facility] Primary Care & Sports Medicine at [facility], [place], [place]       Future Appointments             In 1 month [name], [place] Alar, [place] [facility] Primary Care & Sports Medicine at [facility], [facility]

## 2022-09-06 NOTE — Telephone Encounter (Signed)
Please review.  KP

## 2022-09-07 ENCOUNTER — Telehealth: Payer: Self-pay | Admitting: Physician Assistant

## 2022-09-07 ENCOUNTER — Other Ambulatory Visit: Payer: Self-pay

## 2022-09-07 DIAGNOSIS — M5136 Other intervertebral disc degeneration, lumbar region: Secondary | ICD-10-CM

## 2022-09-07 DIAGNOSIS — Q7649 Other congenital malformations of spine, not associated with scoliosis: Secondary | ICD-10-CM

## 2022-09-07 DIAGNOSIS — M5430 Sciatica, unspecified side: Secondary | ICD-10-CM

## 2022-09-07 DIAGNOSIS — G8929 Other chronic pain: Secondary | ICD-10-CM

## 2022-09-07 NOTE — Telephone Encounter (Signed)
Pt sent Mychart message. Pt got the name and number of the provider she was referred to.  KP

## 2022-09-07 NOTE — Telephone Encounter (Signed)
Copied from CRM [id number]. Topic: General - Other >> Sep 07, 2022 11:55 AM Denise Ellis wrote: Reason for CRM: patient stated she was referred to a pain management provider and she does not know who that is or when her appt is scheduled. Please contact patient with more info

## 2022-09-07 NOTE — Telephone Encounter (Signed)
Spoke with patient in regards to the MyChart messages that were received this afternoon asking for more Tramadol & Methadone.  Explained to the pt that we sent in another referral to a pain clinic for her chronic pain however they were not open today.  Per the provider the pt has been to 3 pain clinics already.  Pt stated she didn't like them because they wanted her to come off Xanax which she doesn't want to.  I explained to the patient that per Jesusita Oka her provider & his supervising physician we will not be prescribing anymore Tramadol & that she needs to seek care at a pain clinic.  Pt advised me to put 5 referrals in & find her 5 different clinics so that she can find one she like.  I advised her to call her insurance & let us know which one she would like to try & we would see if we could get her in.  Advised the patient if she is in that much pain she can go to the ER.  Also advised the patient to be mindful of the amount of MyChart messages she is sending in & that we will get to them in between patients.  Pt asked for a referral to be put in for [facility] which was completed as well.

## 2022-09-11 DIAGNOSIS — Z79891 Long term (current) use of opiate analgesic: Secondary | ICD-10-CM | POA: Diagnosis not present

## 2022-09-11 DIAGNOSIS — Z79899 Other long term (current) drug therapy: Secondary | ICD-10-CM | POA: Diagnosis not present

## 2022-09-11 DIAGNOSIS — M5416 Radiculopathy, lumbar region: Secondary | ICD-10-CM | POA: Diagnosis not present

## 2022-09-11 DIAGNOSIS — M5412 Radiculopathy, cervical region: Secondary | ICD-10-CM | POA: Diagnosis not present

## 2022-09-11 NOTE — Telephone Encounter (Signed)
fyi

## 2022-09-11 NOTE — Telephone Encounter (Signed)
Please adfise

## 2022-09-12 ENCOUNTER — Encounter: Payer: Self-pay | Admitting: Obstetrics and Gynecology

## 2022-09-14 ENCOUNTER — Other Ambulatory Visit: Payer: Self-pay | Admitting: Physician Assistant

## 2022-09-14 DIAGNOSIS — M5136 Other intervertebral disc degeneration, lumbar region: Secondary | ICD-10-CM

## 2022-09-14 DIAGNOSIS — G8929 Other chronic pain: Secondary | ICD-10-CM

## 2022-09-14 DIAGNOSIS — G894 Chronic pain syndrome: Secondary | ICD-10-CM

## 2022-09-21 ENCOUNTER — Other Ambulatory Visit: Payer: Self-pay | Admitting: Physician Assistant

## 2022-09-21 DIAGNOSIS — J449 Chronic obstructive pulmonary disease, unspecified: Secondary | ICD-10-CM | POA: Diagnosis not present

## 2022-10-01 ENCOUNTER — Other Ambulatory Visit: Payer: Self-pay | Admitting: Physician Assistant

## 2022-10-01 ENCOUNTER — Other Ambulatory Visit: Payer: Self-pay

## 2022-10-01 DIAGNOSIS — G4701 Insomnia due to medical condition: Secondary | ICD-10-CM

## 2022-10-05 ENCOUNTER — Ambulatory Visit: Payer: 59 | Admitting: Physician Assistant

## 2022-10-10 ENCOUNTER — Telehealth: Payer: Self-pay | Admitting: Physician Assistant

## 2022-10-10 NOTE — Telephone Encounter (Signed)
Copied from CRM [id number]. Topic: General - Other >> Oct 10, 2022  2:34 PM Turkey B wrote: Reason for CRM: pt returned call back says Dr [name] wanted her to call him. She can be reached at  [phone]

## 2022-10-10 NOTE — Telephone Encounter (Signed)
Called the number listed for call back. Pts partner Maisie Fus answered the phone. Pt stated that pt needed a refill on xanax. Told him that pt has refills to call the pharmacy. He also asked about referral for [facility]. Will reach out to referrals team.  KP

## 2022-10-21 DIAGNOSIS — J449 Chronic obstructive pulmonary disease, unspecified: Secondary | ICD-10-CM | POA: Diagnosis not present

## 2022-10-30 ENCOUNTER — Other Ambulatory Visit: Payer: Self-pay

## 2022-10-30 DIAGNOSIS — L304 Erythema intertrigo: Secondary | ICD-10-CM

## 2022-10-30 DIAGNOSIS — J449 Chronic obstructive pulmonary disease, unspecified: Secondary | ICD-10-CM

## 2022-10-30 MED ORDER — ALBUTEROL SULFATE (2.5 MG/3ML) 0.083% IN NEBU
INHALATION_SOLUTION | RESPIRATORY_TRACT | 1 refills | Status: AC
Start: 2022-10-30 — End: ?

## 2022-10-30 MED ORDER — NYSTATIN 100000 UNIT/GM EX CREA
1.0000 | TOPICAL_CREAM | Freq: Two times a day (BID) | CUTANEOUS | 0 refills | Status: DC
Start: 2022-10-30 — End: 2023-01-28

## 2022-11-06 ENCOUNTER — Telehealth: Payer: Self-pay | Admitting: Physician Assistant

## 2022-11-06 NOTE — Telephone Encounter (Signed)
Copied from CRM [id number]. Topic: Medicare AWV >> Nov 06, 2022  9:36 AM Payton Doughty wrote: Reason for CRM: N/A 11/06/2022 FOR AWV - MAILBOX FULL  Verlee Rossetti; Care Guide Ambulatory Clinical Support [facility] Direct Dial: [phone]

## 2022-11-07 NOTE — Telephone Encounter (Signed)
Please review.  KP

## 2022-11-08 ENCOUNTER — Ambulatory Visit: Payer: 59

## 2022-11-21 DIAGNOSIS — J449 Chronic obstructive pulmonary disease, unspecified: Secondary | ICD-10-CM | POA: Diagnosis not present

## 2022-11-23 ENCOUNTER — Other Ambulatory Visit: Payer: Self-pay | Admitting: Physician Assistant

## 2022-11-23 DIAGNOSIS — F410 Panic disorder [episodic paroxysmal anxiety] without agoraphobia: Secondary | ICD-10-CM

## 2022-11-26 ENCOUNTER — Other Ambulatory Visit: Payer: Self-pay

## 2022-11-26 NOTE — Telephone Encounter (Signed)
Requested medications are due for refill today.  yes  Requested medications are on the active medications list.  yes  Last refill. 09/04/2022 #90 2 rf  Future visit scheduled.   no  Notes to clinic.  Refill not delegated.    Requested Prescriptions  Pending Prescriptions Disp Refills   ALPRAZolam (XANAX) 1 MG tablet [Pharmacy Med Name: ALPRAZOLAM 1MG  TABLETS] 90 tablet     Sig: TAKE 1 TABLET(1 MG) BY MOUTH THREE TIMES DAILY     Not Delegated - Psychiatry: Anxiolytics/Hypnotics 2 Failed - 11/23/2022  7:01 PM      Failed - This refill cannot be delegated      Failed - Urine Drug Screen completed in last 360 days      Passed - Patient is not pregnant      Passed - Valid encounter within last 6 months    Recent Outpatient Visits           2 months ago Depo-Provera contraceptive status   [place] [facility] Medicine at [facility], [place], [place]   4 months ago Foot pain, left   [facility] Primary Care & Sports Medicine at [facility], [place], [place]   4 months ago Chronic pain associated with significant psychosocial dysfunction   [facility] Primary Care & Sports Medicine at [facility], [place], [place]

## 2022-11-26 NOTE — Telephone Encounter (Signed)
Sent message to pt on Mychart.  KP

## 2022-11-26 NOTE — Telephone Encounter (Signed)
Please review.  KP

## 2022-11-30 ENCOUNTER — Encounter: Payer: Self-pay | Admitting: Plastic Surgery

## 2022-11-30 ENCOUNTER — Ambulatory Visit: Payer: 59 | Admitting: Plastic Surgery

## 2022-11-30 ENCOUNTER — Ambulatory Visit: Payer: 59 | Admitting: Physician Assistant

## 2022-11-30 VITALS — BP 112/74 | HR 78 | Ht 66.0 in | Wt 259.6 lb

## 2022-11-30 DIAGNOSIS — G8929 Other chronic pain: Secondary | ICD-10-CM

## 2022-11-30 DIAGNOSIS — R21 Rash and other nonspecific skin eruption: Secondary | ICD-10-CM

## 2022-11-30 DIAGNOSIS — M5136 Other intervertebral disc degeneration, lumbar region: Secondary | ICD-10-CM

## 2022-11-30 DIAGNOSIS — M545 Low back pain, unspecified: Secondary | ICD-10-CM | POA: Diagnosis not present

## 2022-11-30 DIAGNOSIS — M546 Pain in thoracic spine: Secondary | ICD-10-CM

## 2022-11-30 DIAGNOSIS — M25511 Pain in right shoulder: Secondary | ICD-10-CM | POA: Diagnosis not present

## 2022-11-30 DIAGNOSIS — Q7649 Other congenital malformations of spine, not associated with scoliosis: Secondary | ICD-10-CM

## 2022-11-30 DIAGNOSIS — M25512 Pain in left shoulder: Secondary | ICD-10-CM

## 2022-11-30 DIAGNOSIS — F1920 Other psychoactive substance dependence, uncomplicated: Secondary | ICD-10-CM

## 2022-11-30 DIAGNOSIS — N62 Hypertrophy of breast: Secondary | ICD-10-CM | POA: Diagnosis not present

## 2022-11-30 DIAGNOSIS — G894 Chronic pain syndrome: Secondary | ICD-10-CM

## 2022-11-30 DIAGNOSIS — M542 Cervicalgia: Secondary | ICD-10-CM | POA: Diagnosis not present

## 2022-11-30 DIAGNOSIS — Z6841 Body Mass Index (BMI) 40.0 and over, adult: Secondary | ICD-10-CM

## 2022-11-30 NOTE — Progress Notes (Signed)
Patient ID: Denise Ellis, female    DOB: 06-Jun-1980, 42 y.o.   MRN: [id number]   Chief Complaint  Patient presents with   Advice Only   Breast Problem    Mammary Hyperplasia: The patient is a 42 y.o. female with a history of mammary hyperplasia for several years.  She has extremely large breasts causing symptoms that include the following: Back pain in the upper and lower back, including neck pain. She pulls or pins her bra straps to provide better lift and relief of the pressure and pain. She notices relief by holding her breast up manually.  Her shoulder straps cause grooves and pain and pressure that requires padding for relief. Pain medication is sometimes required with motrin and tylenol.  Activities that are hindered by enlarged breasts include: exercise and running.  She has tried supportive clothing as well as fitted bras without improvement.  Her breasts are extremely large and fairly symmetric but the right is longer.  She has hyperpigmentation of the inframammary area on both sides.  The sternal to nipple distance on the right is 39 cm and the left is 34 cm.  She is 5 feet 6 inches tall and weighs 259 pounds.  The BMI = 41.8 kg/m.  Preoperative bra size = DD cup.  The estimated excess breast tissue to be removed at the time of surgery = 900 grams on the left and 900 grams on the right.  Mammogram history: none.  Family history of breast cancer:  no.  Tobacco use:  yes.   The patient expresses the desire to pursue surgical intervention.     Review of Systems  Constitutional: Negative.   HENT: Negative.    Eyes: Negative.   Respiratory: Negative.    Cardiovascular: Negative.   Gastrointestinal: Negative.   Endocrine: Negative.   Genitourinary: Negative.   Musculoskeletal:  Positive for back pain and neck pain.  Skin:  Positive for rash.    Past Medical History:  Diagnosis Date   Anxiety    Chronic back pain    Do NOT give narcotic RX!  CALLArlee Muslim,  [phone], her pain specialist before giving any additional prescriptions   Chronic leg pain    right leg "I don't have much feeling in it"   COPD (chronic obstructive pulmonary disease) (HCC)    possible.     Crohn's disease (HCC)    DDD (degenerative disc disease)    Depression    refused psychiatry, counseling, or any medications other than benzos   Stroke [facility])     Past Surgical History:  Procedure Laterality Date   APPENDECTOMY        Current Outpatient Medications:    albuterol (PROVENTIL) (2.5 MG/3ML) 0.083% nebulizer solution, 2.5 mg., Disp: 75 mL, Rfl: 1   [START ON 12/02/2022] ALPRAZolam (XANAX) 1 MG tablet, Take 1 tablet (1 mg total) by mouth 3 (three) times daily as needed for anxiety. Need to see me before next refill., Disp: 90 tablet, Rfl: 0   medroxyPROGESTERone (DEPO-PROVERA) 150 MG/ML injection, Inject 1 mL (150 mg total) into the muscle every 3 (three) months., Disp: 1 mL, Rfl: 1   naloxone (NARCAN) nasal spray 4 mg/0.1 mL, Use one spray in either nostril once for known/suspected opioid overdose. May repeat every 2-3 minutes in alternating nostril til EMS arrives, Disp: , Rfl:    nystatin cream (MYCOSTATIN), Apply 1 Application topically 2 (two) times daily., Disp: 30 g, Rfl: 0   QUEtiapine (SEROQUEL)  300 MG tablet, TAKE 1 TABLET(300 MG) BY MOUTH AT BEDTIME, Disp: 90 tablet, Rfl: 0   triamcinolone cream (KENALOG) 0.1 %, Apply topically 2 (two) times daily. Apply to hands for dyshidrotic eczema (AKA pompholyx), Disp: 30 g, Rfl: 5   clindamycin (CLEOCIN) 300 MG capsule, Take 300 mg by mouth as needed. (Patient not taking: Reported on 11/30/2022), Disp: , Rfl:    valACYclovir (VALTREX) 1000 MG tablet, SMARTSIG:2 Tablet(s) By Mouth Every 12 Hours (Patient not taking: Reported on 11/30/2022), Disp: , Rfl:   Current Facility-Administered Medications:    medroxyPROGESTERone Acetate SUSY 150 mg, 150 mg, Intramuscular, Q90 days, Tillie Fantasia P, PA   Objective:   Vitals:    11/30/22 1351  BP: 112/74  Pulse: 78  SpO2: 95%    Physical Exam Vitals and nursing note reviewed.  Constitutional:      Appearance: Normal appearance.  HENT:     Head: Normocephalic and atraumatic.  Cardiovascular:     Rate and Rhythm: Normal rate.     Pulses: Normal pulses.  Pulmonary:     Effort: Pulmonary effort is normal.  Abdominal:     General: There is no distension.     Palpations: Abdomen is soft.     Tenderness: There is no abdominal tenderness.  Skin:    General: Skin is warm.     Capillary Refill: Capillary refill takes less than 2 seconds.  Neurological:     Mental Status: She is alert and oriented to person, place, and time.  Psychiatric:        Mood and Affect: Mood normal.        Behavior: Behavior normal.        Thought Content: Thought content normal.        Judgment: Judgment normal.     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 The risk were discussed and include but not limited to the following:  Breast asymmetry, fluid accumulation, firmness of the breast, inability to breast feed, loss of nipple or areola, skin loss, change in skin and nipple sensation, fat necrosis of the breast tissue, bleeding, infection and healing delay.  There are risks of anesthesia and injury to nerves or blood vessels.  Allergic reaction to tape, suture and skin glue are possible.  There will be swelling.  Any of these can lead to the need for revisional surgery which is not included in this surgery.  A breast  reduction has potential to interfere with diagnostic procedures in the future.  This procedure is best done when the breast is fully developed.  Changes in the breast will continue to occur over time: pregnancy, weight gain or weigh loss. No guarantees are given for a certain bra or breast size.    Total time: 30 minutes. This includes time spent with the patient during the visit as well as time spent before and after the visit reviewing the chart, documenting the encounter, ordering pertinent studies and literature for the patient.    If the patient can stop smoking she would be a very good candidate for bilateral  breast reduction due to her chronic pain issues and infections in her skin folds.  The patient has not had a mammogram so I will get that ordered.  She is also a really good candidate for the healthy weight and wellness center.  I do not think the patient is going to be willing to stop smoking but we remain available as needed. Pictures were obtained of the patient and placed in the chart with the patient's or guardian's permission.   Alena Bills Jonanthan Bolender, DO

## 2022-12-04 ENCOUNTER — Encounter: Payer: 59 | Admitting: Obstetrics & Gynecology

## 2022-12-05 ENCOUNTER — Ambulatory Visit: Payer: 59 | Admitting: Physician Assistant

## 2022-12-17 ENCOUNTER — Encounter: Payer: 59 | Admitting: Obstetrics & Gynecology

## 2022-12-21 ENCOUNTER — Other Ambulatory Visit: Payer: Self-pay | Admitting: Physician Assistant

## 2022-12-21 ENCOUNTER — Telehealth: Payer: 59 | Admitting: Physician Assistant

## 2022-12-21 ENCOUNTER — Encounter: Payer: Self-pay | Admitting: Obstetrics & Gynecology

## 2022-12-21 DIAGNOSIS — G4701 Insomnia due to medical condition: Secondary | ICD-10-CM

## 2022-12-21 DIAGNOSIS — F410 Panic disorder [episodic paroxysmal anxiety] without agoraphobia: Secondary | ICD-10-CM

## 2022-12-21 NOTE — Telephone Encounter (Signed)
This is a duplicate request. There have been 4 requests sent from pharmacy. Pt has appt at 1600 today.   Requested Prescriptions  Pending Prescriptions Disp Refills   QUEtiapine (SEROQUEL) 300 MG tablet [Pharmacy Med Name: QUETIAPINE 300MG  TABLETS] 90 tablet 0    Sig: TAKE 1 TABLET(300 MG) BY MOUTH AT BEDTIME     Not Delegated - Psychiatry:  Antipsychotics - Second Generation (Atypical) - quetiapine Failed - 12/21/2022  3:38 PM      Failed - This refill cannot be delegated      Failed - TSH in normal range and within 360 days    TSH  Date Value Ref Range Status  02/01/2014 1.900 0.350 - 4.500 uIU/mL Final    Comment:    Performed at [facility]         Failed - Lipid Panel in normal range within the last 12 months    No results found for: "CHOL", "POCCHOL", "CHOLTOT" No results found for: "LDLCALC", "LDLC", "HIRISKLDL", "POCLDL", "LDLDIRECT", "REALLDLC", "TOTLDLC" No results found for: "HDL", "POCHDL" No results found for: "TRIG", "POCTRIG"       Failed - CBC within normal limits and completed in the last 12 months    WBC  Date Value Ref Range Status  01/31/2017 12.8 (H) 4.0 - 10.5 K/uL Final   RBC  Date Value Ref Range Status  01/31/2017 4.57 3.87 - 5.11 MIL/uL Final   Hemoglobin  Date Value Ref Range Status  10/01/2017 14.6 12.0 - 15.0 g/dL Final   HGB  Date Value Ref Range Status  07/17/2011 11.2 (L) 12.0 - 16.0 g/dL Final   HCT  Date Value Ref Range Status  10/01/2017 43.0 36.0 - 46.0 % Final  07/17/2011 35.1 35.0 - 47.0 % Final   MCHC  Date Value Ref Range Status  01/31/2017 31.1 30.0 - 36.0 g/dL Final   [facility]  Date Value Ref Range Status  01/31/2017 28.0 26.0 - 34.0 pg Final   MCV  Date Value Ref Range Status  01/31/2017 89.9 78.0 - 100.0 fL Final  07/17/2011 83 80 - 100 fL Final   No results found for: "PLTCOUNTKUC", "LABPLAT", "POCPLA" RDW  Date Value Ref Range Status  01/31/2017 15.2 11.5 - 15.5 % Final  07/17/2011 16.1 (H) 11.5 - 14.5 %  Final         Failed - CMP within normal limits and completed in the last 12 months    Albumin  Date Value Ref Range Status  08/19/2020 4.2 3.5 - 5.0 Final  07/17/2011 3.6 3.4 - 5.0 g/dL Final   Alkaline Phosphatase  Date Value Ref Range Status  08/19/2020 101 25 - 125 Final  07/17/2011 82 50 - 136 Unit/L Final   ALT  Date Value Ref Range Status  08/19/2020 10 7 - 35 U/L Final   SGPT (ALT)  Date Value Ref Range Status  07/17/2011 22 U/L Final    Comment:    12-78 NOTE: NEW REFERENCE RANGE 03/16/2011    AST  Date Value Ref Range Status  08/19/2020 13 13 - 35 Final   SGOT(AST)  Date Value Ref Range Status  07/17/2011 18 15 - 37 Unit/L Final   BUN  Date Value Ref Range Status  08/19/2020 10 4 - 21 Final  07/17/2011 5 (L) 7 - 18 mg/dL Final   Calcium  Date Value Ref Range Status  08/19/2020 8.9 8.7 - 10.7 Final   Calcium, Total  Date Value Ref Range Status  07/17/2011 8.8  8.5 - 10.1 mg/dL Final   Calcium, Ion  Date Value Ref Range Status  10/01/2017 0.98 (L) 1.15 - 1.40 mmol/L Final   CO2  Date Value Ref Range Status  08/19/2020 17 13 - 22 Final   Co2  Date Value Ref Range Status  07/17/2011 25 21 - 32 mmol/L Final   TCO2  Date Value Ref Range Status  10/01/2017 22 22 - 32 mmol/L Final   Creatinine  Date Value Ref Range Status  08/19/2020 0.8 0.5 - 1.1 Final  07/17/2011 0.69 0.60 - 1.30 mg/dL Final   Creatinine, Ser  Date Value Ref Range Status  10/01/2017 0.60 0.44 - 1.00 mg/dL Final   Creatinine, Urine  Date Value Ref Range Status  07/28/2014 157.26 >20.0 mg/dL Final   Glucose  Date Value Ref Range Status  08/19/2020 82  Final  07/17/2011 92 65 - 99 mg/dL Final   Glucose, Bld  Date Value Ref Range Status  10/01/2017 78 65 - 99 mg/dL Final   Potassium  Date Value Ref Range Status  08/19/2020 4.8 3.5 - 5.1 mEq/L Final  07/17/2011 3.7 3.5 - 5.1 mmol/L Final   Sodium  Date Value Ref Range Status  08/19/2020 143 137 - 147 Final   07/17/2011 145 136 - 145 mmol/L Final   Total Bilirubin  Date Value Ref Range Status  10/01/2017 0.4 0.3 - 1.2 mg/dL Final   Bilirubin,Total  Date Value Ref Range Status  07/17/2011 0.2 0.2 - 1.0 mg/dL Final   Bilirubin, Total  Date Value Ref Range Status  08/19/2020 0.2  Final   Bilirubin, Direct  Date Value Ref Range Status  06/15/2014 <0.1 0.0 - 0.5 mg/dL Final   Indirect Bilirubin  Date Value Ref Range Status  06/15/2014 NOT CALCULATED 0.3 - 0.9 mg/dL Final   Protein, ur  Date Value Ref Range Status  10/01/2017 30 (A) NEGATIVE mg/dL Final   Total Protein  Date Value Ref Range Status  10/01/2017 7.3 6.5 - 8.1 g/dL Final  16/01/9603 7.3 6.4 - 8.2 g/dL Final   EGFR (African American)  Date Value Ref Range Status  07/17/2011 >60 >41mL/min Final   GFR calc Af Amer  Date Value Ref Range Status  10/01/2017 >60 >60 mL/min Final    Comment:    (NOTE) The eGFR has been calculated using the CKD EPI equation. This calculation has not been validated in all clinical situations. eGFR's persistently <60 mL/min signify possible Chronic Kidney Disease.    eGFR  Date Value Ref Range Status  08/19/2020 95  Final   EGFR (Non-African Amer.)  Date Value Ref Range Status  07/17/2011 >60 >87mL/min Final    Comment:    eGFR values <24mL/min/1.73 m2 may be an indication of chronic kidney disease (CKD). Calculated eGFR, using the MRDR Study equation, is useful in  patients with stable renal function. The eGFR calculation will not be reliable in acutely ill patients when serum creatinine is changing rapidly. It is not useful in patients on dialysis. The eGFR calculation may not be applicable to patients at the low and high extremes of body sizes, pregnant women, and vegetarians.    GFR calc non Af Amer  Date Value Ref Range Status  10/01/2017 >60 >60 mL/min Final         Passed - Completed PHQ-2 or PHQ-9 in the last 360 days      Passed - Last BP in normal range     BP Readings from Last 1 Encounters:  11/30/22  112/74         Passed - Last Heart Rate in normal range    Pulse Readings from Last 1 Encounters:  11/30/22 78         Passed - Valid encounter within last 6 months    Recent Outpatient Visits           3 months ago Depo-Provera contraceptive status   [place] [facility] at [facility], [place], [place]   5 months ago Foot pain, left   [facility] & [facility] Medicine at [facility], [place], [place]   5 months ago Chronic pain associated with significant psychosocial dysfunction   [facility] Primary [facility] & Sports Medicine at [facility], [place], [place]       Future Appointments             Today [name], [place] Alar, [place] [facility] Primary Care & Sports Medicine at [facility]             ALPRAZolam Prudy Feeler) 1 MG tablet [Pharmacy Med Name: ALPRAZOLAM 1MG  TABLETS] 90 tablet     Sig: TAKE 1 TABLET(1 MG) BY MOUTH THREE TIMES DAILY AS NEEDED FOR ANXIETY     Not Delegated - Psychiatry: Anxiolytics/Hypnotics 2 Failed - 12/21/2022  3:38 PM      Failed - This refill cannot be delegated      Failed - Urine Drug Screen completed in last 360 days      Passed - Patient is not pregnant      Passed - Valid encounter within last 6 months    Recent Outpatient Visits           3 months ago Depo-Provera contraceptive status   [place] [facility] at [facility], [place] Alar, [place]   5 months ago Foot pain, left   [facility] Primary Care & Sports Medicine at [facility], [place], [place]   5 months ago Chronic pain associated with significant psychosocial dysfunction   [facility] Primary Care & Sports Medicine at [facility], [place] Al[place], [place]       Future Appointments             Today [name], [place] Alar, [place] [facility] Health Primary Care & Sports Medicine at [facility], [facility]

## 2022-12-21 NOTE — Telephone Encounter (Signed)
Requested medication (s) are due for refill today: routing for review  Requested medication (s) are on the active medication list: yes  Last refill:  10/01/22  Future visit scheduled: no  Notes to clinic:  Unable to refill per protocol, cannot delegate.      Requested Prescriptions  Pending Prescriptions Disp Refills   ALPRAZolam (XANAX) 1 MG tablet [Pharmacy Med Name: ALPRAZOLAM 1MG  TABLETS] 90 tablet     Sig: TAKE 1 TABLET(1 MG) BY MOUTH THREE TIMES DAILY AS NEEDED FOR ANXIETY     Not Delegated - Psychiatry: Anxiolytics/Hypnotics 2 Failed - 12/21/2022  3:38 PM      Failed - This refill cannot be delegated      Failed - Urine Drug Screen completed in last 360 days      Passed - Patient is not pregnant      Passed - Valid encounter within last 6 months    Recent Outpatient Visits           3 months ago Depo-Provera contraceptive status   [place] [facility] at [facility], [place], [place]   5 months ago Foot pain, left   [place] [facility] at [facility], [place], [place]   5 months ago Chronic pain associated with significant psychosocial dysfunction   [facility] Primary Care & Sports Medicine at [facility], [place], [place]               QUEtiapine (SEROQUEL) 300 MG tablet [Pharmacy Med Name: QUETIAPINE 300MG  TABLETS] 90 tablet 0    Sig: TAKE 1 TABLET(300 MG) BY MOUTH AT BEDTIME     Not Delegated - Psychiatry:  Antipsychotics - Second Generation (Atypical) - quetiapine Failed - 12/21/2022  3:38 PM      Failed - This refill cannot be delegated      Failed - TSH in normal range and within 360 days    TSH  Date Value Ref Range Status  02/01/2014 1.900 0.350 - 4.500 uIU/mL Final    Comment:    Performed at [facility]         Failed - Lipid Panel in normal range within the last 12 months    No results found for: "CHOL", "POCCHOL", "CHOLTOT" No results found for: "LDLCALC",  "LDLC", "HIRISKLDL", "POCLDL", "LDLDIRECT", "REALLDLC", "TOTLDLC" No results found for: "HDL", "POCHDL" No results found for: "TRIG", "POCTRIG"       Failed - CBC within normal limits and completed in the last 12 months    WBC  Date Value Ref Range Status  01/31/2017 12.8 (H) 4.0 - 10.5 K/uL Final   RBC  Date Value Ref Range Status  01/31/2017 4.57 3.87 - 5.11 MIL/uL Final   Hemoglobin  Date Value Ref Range Status  10/01/2017 14.6 12.0 - 15.0 g/dL Final   HGB  Date Value Ref Range Status  07/17/2011 11.2 (L) 12.0 - 16.0 g/dL Final   HCT  Date Value Ref Range Status  10/01/2017 43.0 36.0 - 46.0 % Final  07/17/2011 35.1 35.0 - 47.0 % Final   MCHC  Date Value Ref Range Status  01/31/2017 31.1 30.0 - 36.0 g/dL Final   [facility]  Date Value Ref Range Status  01/31/2017 28.0 26.0 - 34.0 pg Final   MCV  Date Value Ref Range Status  01/31/2017 89.9 78.0 - 100.0 fL Final  07/17/2011 83 80 - 100 fL Final   No results found for: "PLTCOUNTKUC", "  LABPLAT", "POCPLA" RDW  Date Value Ref Range Status  01/31/2017 15.2 11.5 - 15.5 % Final  07/17/2011 16.1 (H) 11.5 - 14.5 % Final         Failed - CMP within normal limits and completed in the last 12 months    Albumin  Date Value Ref Range Status  08/19/2020 4.2 3.5 - 5.0 Final  07/17/2011 3.6 3.4 - 5.0 g/dL Final   Alkaline Phosphatase  Date Value Ref Range Status  08/19/2020 101 25 - 125 Final  07/17/2011 82 50 - 136 Unit/L Final   ALT  Date Value Ref Range Status  08/19/2020 10 7 - 35 U/L Final   SGPT (ALT)  Date Value Ref Range Status  07/17/2011 22 U/L Final    Comment:    12-78 NOTE: NEW REFERENCE RANGE 03/16/2011    AST  Date Value Ref Range Status  08/19/2020 13 13 - 35 Final   SGOT(AST)  Date Value Ref Range Status  07/17/2011 18 15 - 37 Unit/L Final   BUN  Date Value Ref Range Status  08/19/2020 10 4 - 21 Final  07/17/2011 5 (L) 7 - 18 mg/dL Final   Calcium  Date Value Ref Range Status   08/19/2020 8.9 8.7 - 10.7 Final   Calcium, Total  Date Value Ref Range Status  07/17/2011 8.8 8.5 - 10.1 mg/dL Final   Calcium, Ion  Date Value Ref Range Status  10/01/2017 0.98 (L) 1.15 - 1.40 mmol/L Final   CO2  Date Value Ref Range Status  08/19/2020 17 13 - 22 Final   Co2  Date Value Ref Range Status  07/17/2011 25 21 - 32 mmol/L Final   TCO2  Date Value Ref Range Status  10/01/2017 22 22 - 32 mmol/L Final   Creatinine  Date Value Ref Range Status  08/19/2020 0.8 0.5 - 1.1 Final  07/17/2011 0.69 0.60 - 1.30 mg/dL Final   Creatinine, Ser  Date Value Ref Range Status  10/01/2017 0.60 0.44 - 1.00 mg/dL Final   Creatinine, Urine  Date Value Ref Range Status  07/28/2014 157.26 >20.0 mg/dL Final   Glucose  Date Value Ref Range Status  08/19/2020 82  Final  07/17/2011 92 65 - 99 mg/dL Final   Glucose, Bld  Date Value Ref Range Status  10/01/2017 78 65 - 99 mg/dL Final   Potassium  Date Value Ref Range Status  08/19/2020 4.8 3.5 - 5.1 mEq/L Final  07/17/2011 3.7 3.5 - 5.1 mmol/L Final   Sodium  Date Value Ref Range Status  08/19/2020 143 137 - 147 Final  07/17/2011 145 136 - 145 mmol/L Final   Total Bilirubin  Date Value Ref Range Status  10/01/2017 0.4 0.3 - 1.2 mg/dL Final   Bilirubin,Total  Date Value Ref Range Status  07/17/2011 0.2 0.2 - 1.0 mg/dL Final   Bilirubin, Total  Date Value Ref Range Status  08/19/2020 0.2  Final   Bilirubin, Direct  Date Value Ref Range Status  06/15/2014 <0.1 0.0 - 0.5 mg/dL Final   Indirect Bilirubin  Date Value Ref Range Status  06/15/2014 NOT CALCULATED 0.3 - 0.9 mg/dL Final   Protein, ur  Date Value Ref Range Status  10/01/2017 30 (A) NEGATIVE mg/dL Final   Total Protein  Date Value Ref Range Status  10/01/2017 7.3 6.5 - 8.1 g/dL Final  08/65/7846 7.3 6.4 - 8.2 g/dL Final   EGFR (African American)  Date Value Ref Range Status  07/17/2011 >60 >32mL/min Final  GFR calc Af Amer  Date Value Ref  Range Status  10/01/2017 >60 >60 mL/min Final    Comment:    (NOTE) The eGFR has been calculated using the CKD EPI equation. This calculation has not been validated in all clinical situations. eGFR's persistently <60 mL/min signify possible Chronic Kidney Disease.    eGFR  Date Value Ref Range Status  08/19/2020 95  Final   EGFR (Non-African Amer.)  Date Value Ref Range Status  07/17/2011 >60 >77mL/min Final    Comment:    eGFR values <36mL/min/1.73 m2 may be an indication of chronic kidney disease (CKD). Calculated eGFR, using the MRDR Study equation, is useful in  patients with stable renal function. The eGFR calculation will not be reliable in acutely ill patients when serum creatinine is changing rapidly. It is not useful in patients on dialysis. The eGFR calculation may not be applicable to patients at the low and high extremes of body sizes, pregnant women, and vegetarians.    GFR calc non Af Amer  Date Value Ref Range Status  10/01/2017 >60 >60 mL/min Final         Passed - Completed PHQ-2 or PHQ-9 in the last 360 days      Passed - Last BP in normal range    BP Readings from Last 1 Encounters:  11/30/22 112/74         Passed - Last Heart Rate in normal range    Pulse Readings from Last 1 Encounters:  11/30/22 78         Passed - Valid encounter within last 6 months    Recent Outpatient Visits           3 months ago Depo-Provera contraceptive status   [place] [facility] at [facility], [place], [place]   5 months ago Foot pain, left   [facility] Primary Care & Sports Medicine at [facility], [place], [place]   5 months ago Chronic pain associated with significant psychosocial dysfunction   [facility] Primary Care & Sports Medicine at [facility], [place], [place]

## 2022-12-21 NOTE — Telephone Encounter (Signed)
Requested medication (s) are due for refill today: yes  Requested medication (s) are on the active medication list: yes  Last refill:  10/01/22  Future visit scheduled: yes  Notes to clinic:  Unable to refill per protocol, cannot delegate.      Requested Prescriptions  Pending Prescriptions Disp Refills   ALPRAZolam (XANAX) 1 MG tablet [Pharmacy Med Name: ALPRAZOLAM 1MG  TABLETS] 90 tablet     Sig: TAKE 1 TABLET(1 MG) BY MOUTH THREE TIMES DAILY AS NEEDED FOR ANXIETY     Not Delegated - Psychiatry: Anxiolytics/Hypnotics 2 Failed - 12/21/2022  3:38 PM      Failed - This refill cannot be delegated      Failed - Urine Drug Screen completed in last 360 days      Passed - Patient is not pregnant      Passed - Valid encounter within last 6 months    Recent Outpatient Visits           3 months ago Depo-Provera contraceptive status   [place] [facility] at [facility], [place], [place]   5 months ago Foot pain, left   [place] [facility] at [facility], [place], [place]   5 months ago Chronic pain associated with significant psychosocial dysfunction   [facility] Primary Care & Sports Medicine at [facility], [place], [place]       Future Appointments             Today [name], [place], [place] [facility] Health Primary Care & Sports Medicine at [facility], [facility]             QUEtiapine (SEROQUEL) 300 MG tablet [Pharmacy Med Name: QUETIAPINE 300MG  TABLETS] 90 tablet 0    Sig: TAKE 1 TABLET(300 MG) BY MOUTH AT BEDTIME     Not Delegated - Psychiatry:  Antipsychotics - Second Generation (Atypical) - quetiapine Failed - 12/21/2022  3:38 PM      Failed - This refill cannot be delegated      Failed - TSH in normal range and within 360 days    TSH  Date Value Ref Range Status  02/01/2014 1.900 0.350 - 4.500 uIU/mL Final    Comment:    Performed at [facility]         Failed - Lipid Panel in normal  range within the last 12 months    No results found for: "CHOL", "POCCHOL", "CHOLTOT" No results found for: "LDLCALC", "LDLC", "HIRISKLDL", "POCLDL", "LDLDIRECT", "REALLDLC", "TOTLDLC" No results found for: "HDL", "POCHDL" No results found for: "TRIG", "POCTRIG"       Failed - CBC within normal limits and completed in the last 12 months    WBC  Date Value Ref Range Status  01/31/2017 12.8 (H) 4.0 - 10.5 K/uL Final   RBC  Date Value Ref Range Status  01/31/2017 4.57 3.87 - 5.11 MIL/uL Final   Hemoglobin  Date Value Ref Range Status  10/01/2017 14.6 12.0 - 15.0 g/dL Final   HGB  Date Value Ref Range Status  07/17/2011 11.2 (L) 12.0 - 16.0 g/dL Final   HCT  Date Value Ref Range Status  10/01/2017 43.0 36.0 - 46.0 % Final  07/17/2011 35.1 35.0 - 47.0 % Final   MCHC  Date Value Ref Range Status  01/31/2017 31.1 30.0 - 36.0 g/dL Final   [facility]  Date Value Ref Range Status  01/31/2017 28.0 26.0 - 34.0 pg Final  MCV  Date Value Ref Range Status  01/31/2017 89.9 78.0 - 100.0 fL Final  07/17/2011 83 80 - 100 fL Final   No results found for: "PLTCOUNTKUC", "LABPLAT", "POCPLA" RDW  Date Value Ref Range Status  01/31/2017 15.2 11.5 - 15.5 % Final  07/17/2011 16.1 (H) 11.5 - 14.5 % Final         Failed - CMP within normal limits and completed in the last 12 months    Albumin  Date Value Ref Range Status  08/19/2020 4.2 3.5 - 5.0 Final  07/17/2011 3.6 3.4 - 5.0 g/dL Final   Alkaline Phosphatase  Date Value Ref Range Status  08/19/2020 101 25 - 125 Final  07/17/2011 82 50 - 136 Unit/L Final   ALT  Date Value Ref Range Status  08/19/2020 10 7 - 35 U/L Final   SGPT (ALT)  Date Value Ref Range Status  07/17/2011 22 U/L Final    Comment:    12-78 NOTE: NEW REFERENCE RANGE 03/16/2011    AST  Date Value Ref Range Status  08/19/2020 13 13 - 35 Final   SGOT(AST)  Date Value Ref Range Status  07/17/2011 18 15 - 37 Unit/L Final   BUN  Date Value Ref Range Status   08/19/2020 10 4 - 21 Final  07/17/2011 5 (L) 7 - 18 mg/dL Final   Calcium  Date Value Ref Range Status  08/19/2020 8.9 8.7 - 10.7 Final   Calcium, Total  Date Value Ref Range Status  07/17/2011 8.8 8.5 - 10.1 mg/dL Final   Calcium, Ion  Date Value Ref Range Status  10/01/2017 0.98 (L) 1.15 - 1.40 mmol/L Final   CO2  Date Value Ref Range Status  08/19/2020 17 13 - 22 Final   Co2  Date Value Ref Range Status  07/17/2011 25 21 - 32 mmol/L Final   TCO2  Date Value Ref Range Status  10/01/2017 22 22 - 32 mmol/L Final   Creatinine  Date Value Ref Range Status  08/19/2020 0.8 0.5 - 1.1 Final  07/17/2011 0.69 0.60 - 1.30 mg/dL Final   Creatinine, Ser  Date Value Ref Range Status  10/01/2017 0.60 0.44 - 1.00 mg/dL Final   Creatinine, Urine  Date Value Ref Range Status  07/28/2014 157.26 >20.0 mg/dL Final   Glucose  Date Value Ref Range Status  08/19/2020 82  Final  07/17/2011 92 65 - 99 mg/dL Final   Glucose, Bld  Date Value Ref Range Status  10/01/2017 78 65 - 99 mg/dL Final   Potassium  Date Value Ref Range Status  08/19/2020 4.8 3.5 - 5.1 mEq/L Final  07/17/2011 3.7 3.5 - 5.1 mmol/L Final   Sodium  Date Value Ref Range Status  08/19/2020 143 137 - 147 Final  07/17/2011 145 136 - 145 mmol/L Final   Total Bilirubin  Date Value Ref Range Status  10/01/2017 0.4 0.3 - 1.2 mg/dL Final   Bilirubin,Total  Date Value Ref Range Status  07/17/2011 0.2 0.2 - 1.0 mg/dL Final   Bilirubin, Total  Date Value Ref Range Status  08/19/2020 0.2  Final   Bilirubin, Direct  Date Value Ref Range Status  06/15/2014 <0.1 0.0 - 0.5 mg/dL Final   Indirect Bilirubin  Date Value Ref Range Status  06/15/2014 NOT CALCULATED 0.3 - 0.9 mg/dL Final   Protein, ur  Date Value Ref Range Status  10/01/2017 30 (A) NEGATIVE mg/dL Final   Total Protein  Date Value Ref Range Status  10/01/2017 7.3 6.5 -  8.1 g/dL Final  13/11/6576 7.3 6.4 - 8.2 g/dL Final   EGFR (African  American)  Date Value Ref Range Status  07/17/2011 >60 >6mL/min Final   GFR calc Af Amer  Date Value Ref Range Status  10/01/2017 >60 >60 mL/min Final    Comment:    (NOTE) The eGFR has been calculated using the CKD EPI equation. This calculation has not been validated in all clinical situations. eGFR's persistently <60 mL/min signify possible Chronic Kidney Disease.    eGFR  Date Value Ref Range Status  08/19/2020 95  Final   EGFR (Non-African Amer.)  Date Value Ref Range Status  07/17/2011 >60 >48mL/min Final    Comment:    eGFR values <32mL/min/1.73 m2 may be an indication of chronic kidney disease (CKD). Calculated eGFR, using the MRDR Study equation, is useful in  patients with stable renal function. The eGFR calculation will not be reliable in acutely ill patients when serum creatinine is changing rapidly. It is not useful in patients on dialysis. The eGFR calculation may not be applicable to patients at the low and high extremes of body sizes, pregnant women, and vegetarians.    GFR calc non Af Amer  Date Value Ref Range Status  10/01/2017 >60 >60 mL/min Final         Passed - Completed PHQ-2 or PHQ-9 in the last 360 days      Passed - Last BP in normal range    BP Readings from Last 1 Encounters:  11/30/22 112/74         Passed - Last Heart Rate in normal range    Pulse Readings from Last 1 Encounters:  11/30/22 78         Passed - Valid encounter within last 6 months    Recent Outpatient Visits           3 months ago Depo-Provera contraceptive status   [place] [facility] at [facility], [place], [place]   5 months ago Foot pain, left   [facility] Primary Care & Sports Medicine at [facility], [place], [place]   5 months ago Chronic pain associated with significant psychosocial dysfunction   [facility] at [facility], [place], [place]       Future Appointments              Today [name], [place], [place] [facility] Health Primary Care & Sports Medicine at [facility], [facility]

## 2022-12-21 NOTE — Telephone Encounter (Signed)
This is the 4th duplicate request for Xanax and Seroquel.

## 2022-12-22 DIAGNOSIS — J449 Chronic obstructive pulmonary disease, unspecified: Secondary | ICD-10-CM | POA: Diagnosis not present

## 2022-12-25 ENCOUNTER — Telehealth: Payer: 59 | Admitting: Physician Assistant

## 2022-12-25 NOTE — Progress Notes (Deleted)
Date:  12/25/2022   Name:  Denise Ellis   DOB:  [birth year]   MRN:  [id number]   I connected with Denise Ellis on 12/25/2022 via MyChart Video and verified that I am speaking with the correct person using appropriate identifiers. The limitations, risks, security and privacy concerns of performing an evaluation and management service by MyChart Video, including the higher likelihood of inaccurate diagnoses and treatments, and the availability of in person appointments were reviewed. The possible need of an additional face-to-face encounter for complete and high quality delivery of care was discussed. The patient was also made aware that there may be a patient responsible charge related to this service. The patient expressed understanding and wishes to proceed.   Provider location is in medical facility [facility] Primary Care and Sports Medicine at [facility]). Patient location is at their home People involved in care of the patient during this telehealth encounter were myself, my CMA, and my front office/scheduling team member.    Chief Complaint: No chief complaint on file.  HPI Denise Ellis presents today via telehealth for medication refill.  Her mother is not in good health, on hospice, it is difficult for her to make it into the clinic today.   Medication list has been reviewed and updated.  No outpatient medications have been marked as taking for the 12/25/22 encounter (Appointment) with Remo Lipps, PA.   Current Facility-Administered Medications for the 12/25/22 encounter (Appointment) with Remo Lipps, PA  Medication   medroxyPROGESTERone Acetate SUSY 150 mg     Review of Systems  Patient Active Problem List   Diagnosis Date Noted   Depo-Provera contraceptive status 09/05/2022   Symptomatic mammary hypertrophy 08/30/2022   Endometriosis 08/30/2022   History of cervical dysplasia 08/30/2022   Dyshidrotic eczema 07/16/2022   Nephrolithiasis 08/31/2020    Generalized anxiety disorder with panic attacks 02/01/2017   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HCC) 11/14/2015   Lumbar degenerative disc disease 05/25/2014   Chronic pain associated with significant psychosocial dysfunction 05/25/2014   Class 3 severe obesity with serious comorbidity and body mass index (BMI) of 40.0 to 44.9 in adult [facility]) 01/31/2014   COPD (chronic obstructive pulmonary disease) (HCC)    Chronic leg pain    Chronic back pain    Hypovitaminosis D 08/10/2013   Insomnia 07/20/2013   Paroxysmal nocturnal dyspnea 07/20/2013   Neuralgia neuritis, sciatic nerve 08/25/2012   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   Esophageal reflux 08/25/2012   Suicidal ideation 08/25/2012   History of TIA (transient ischemic attack) 08/25/2012   Polycystic ovaries 10/06/2008    Allergies  Allergen Reactions   Ceftriaxone Other (See Comments)    Tightness in chest, throat   Gabapentin Anaphylaxis   Naproxen Hives, Nausea And Vomiting and Other (See Comments)   Codeine Other (See Comments)   Ibuprofen Nausea And Vomiting and Hives   Methocarbamol Nausea And Vomiting   Other Other (See Comments)   Meloxicam Hives   Pregabalin     Other Reaction(s): made her pain worse   Gadolinium Derivatives Nausea And Vomiting    Immunization History  Administered Date(s) Administered   Tdap 05/28/2007    Past Surgical History:  Procedure Laterality Date   APPENDECTOMY      Social History   Tobacco Use   Smoking status: Some Days    Current packs/day: 0.50    Average packs/day: 0.5 packs/day for  18.0 years (9.0 ttl pk-yrs)    Types: Cigarettes   Smokeless tobacco: Never  Vaping Use   Vaping status: Never Used  Substance Use Topics   Alcohol use: No   Drug use: Not Currently    Family History  Problem Relation Age of Onset   Stroke Mother    Hypertension Mother    Ovarian cancer Mother     Diabetes Father    Heart disease Father    Diabetes Mellitus I Father    Heart disease Brother         08/29/2022    4:23 PM 07/16/2022    4:15 PM 07/12/2022    9:53 AM  GAD 7 : Generalized Anxiety Score  Nervous, Anxious, on Edge 3 3 3   Control/stop worrying 3 3 3   Worry too much - different things 3 3 3   Trouble relaxing 3 3 3   Restless 3 3 3   Easily annoyed or irritable 1 1 3   Afraid - awful might happen 2 2 3   Total GAD 7 Score 18 18 21   Anxiety Difficulty Very difficult Extremely difficult Extremely difficult       08/29/2022    4:23 PM 07/16/2022    4:14 PM 07/12/2022    9:53 AM  Depression screen PHQ 2/9  Decreased Interest 2 2 2   Down, Depressed, Hopeless 2 2 2   PHQ - 2 Score 4 4 4   Altered sleeping 2 2 3   Tired, decreased energy 2 2 2   Change in appetite 1 1 3   Feeling bad or failure about yourself  2 2 3   Trouble concentrating 2 2 2   Moving slowly or fidgety/restless 2 2 2   Suicidal thoughts 0 0 0  PHQ-9 Score 15 15 19   Difficult doing work/chores Very difficult Extremely dIfficult Extremely dIfficult    BP Readings from Last 3 Encounters:  11/30/22 112/74  08/29/22 122/76  07/16/22 124/70    Wt Readings from Last 3 Encounters:  11/30/22 259 lb 9.6 oz (117.8 kg)  08/29/22 252 lb (114.3 kg)  07/16/22 265 lb 12.8 oz (120.6 kg)    There were no vitals taken for this visit.  Physical Exam General: Speaking full sentences, no audible heavy breathing. Sounds alert and appropriately interactive. Well-appearing. Face symmetric. Extraocular movements intact. Pupils equal and round. No nasal flaring or accessory muscle use visualized.  Recent Labs     Component Value Date/Time   NA 143 08/19/2020 0000   NA 145 07/17/2011 1902   K 4.8 08/19/2020 0000   K 3.7 07/17/2011 1902   CL 104 08/19/2020 0000   CL 109 (H) 07/17/2011 1902   CO2 17 08/19/2020 0000   CO2 25 07/17/2011 1902   GLUCOSE 78 10/01/2017 0208   GLUCOSE 92 07/17/2011 1902   BUN 10  08/19/2020 0000   BUN 5 (L) 07/17/2011 1902   CREATININE 0.8 08/19/2020 0000   CREATININE 0.60 10/01/2017 0208   CREATININE 0.69 07/17/2011 1902   CALCIUM 8.9 08/19/2020 0000   CALCIUM 8.8 07/17/2011 1902   PROT 7.3 10/01/2017 0119   PROT 7.3 07/17/2011 1902   ALBUMIN 4.2 08/19/2020 0000   ALBUMIN 3.6 07/17/2011 1902   AST 13 08/19/2020 0000   AST 18 07/17/2011 1902   ALT 10 08/19/2020 0000   ALT 22 07/17/2011 1902   ALKPHOS 101 08/19/2020 0000   ALKPHOS 82 07/17/2011 1902   BILITOT 0.4 10/01/2017 0119   BILITOT 0.2 07/17/2011 1902   GFRNONAA >60 10/01/2017 0119   GFRNONAA >60  07/17/2011 1902   GFRAA >60 10/01/2017 0119   GFRAA >60 07/17/2011 1902    Lab Results  Component Value Date   WBC 12.8 (H) 01/31/2017   HGB 14.6 10/01/2017   HCT 43.0 10/01/2017   MCV 89.9 01/31/2017   PLT 390 01/31/2017   No results found for: "HGBA1C" No results found for: "CHOL", "HDL", "LDLCALC", "LDLDIRECT", "TRIG", "CHOLHDL" Lab Results  Component Value Date   TSH 1.900 02/01/2014     Assessment and Plan:  1. Generalized anxiety disorder with panic attacks PDMP reviewed, last filled alprazolam early 11/29/22 with 30-day supply, previous fill 7/13 so between the two fills, she should be able to last until Sept 10 or later.   2. Insomnia due to medical condition ***     I discussed the above assessment and treatment plan with the patient. The patient was provided an opportunity to ask questions and all were answered. The patient agreed with the plan and demonstrated an understanding of the instructions. The patient was advised to call back or seek an in-person evaluation if the symptoms worsen or if the condition fails to improve as anticipated. I provided a total time of *** minutes inclusive of time utilized for medical chart review, information gathering, care coordination with staff, and documentation completion.  Alvester Morin, PA-C, DMSc, Nutritionist [facility] Primary Care and  Sports Medicine [facility] [phone]

## 2022-12-26 ENCOUNTER — Telehealth (INDEPENDENT_AMBULATORY_CARE_PROVIDER_SITE_OTHER): Payer: 59 | Admitting: Physician Assistant

## 2022-12-26 ENCOUNTER — Encounter: Payer: Self-pay | Admitting: Physician Assistant

## 2022-12-26 DIAGNOSIS — G4701 Insomnia due to medical condition: Secondary | ICD-10-CM | POA: Diagnosis not present

## 2022-12-26 DIAGNOSIS — F410 Panic disorder [episodic paroxysmal anxiety] without agoraphobia: Secondary | ICD-10-CM | POA: Diagnosis not present

## 2022-12-26 DIAGNOSIS — F411 Generalized anxiety disorder: Secondary | ICD-10-CM

## 2022-12-26 DIAGNOSIS — R5383 Other fatigue: Secondary | ICD-10-CM

## 2022-12-26 MED ORDER — ALPRAZOLAM 1 MG PO TABS: 1.0000 mg | ORAL_TABLET | Freq: Three times a day (TID) | ORAL | 0 refills | Status: AC | PRN

## 2022-12-26 MED ORDER — QUETIAPINE FUMARATE 300 MG PO TABS: 300.0000 mg | ORAL_TABLET | Freq: Every day | ORAL | 0 refills | Status: DC

## 2022-12-26 NOTE — Progress Notes (Signed)
Date:  12/26/2022   Name:  Denise Ellis   DOB:  February 12, 1981   MRN:  [id number]   I connected with Denise Ellis on 12/26/2022 via MyChart Video and verified that I am speaking with the correct Denise Ellis using appropriate identifiers. The limitations, risks, security and privacy concerns of performing an evaluation and management service by MyChart Video, including the higher likelihood of inaccurate diagnoses and treatments, and the availability of in Iz[place] Bingman appointments were reviewed. The possible need of an additional face-to-face encounter for complete and high quality delivery of care was discussed. The patient was also made aware that there may be a patient responsible charge related to this service. The patient expressed understanding and wishes to proceed.   Provider location is in medical facility [facility] Primary Care and Sports Medicine at [facility]). Patient location is at their home People involved in care of the patient during this telehealth encounter were myself, my CMA, and my front office/scheduling team member.    Chief Complaint: Anxiety and Insomnia  HPI Denise Ellis presents today via telehealth for medication refill.  Her mother is not in good health, on hospice due to RA/CVA/CHF, it is difficult for Denise Ellis to make it into the clinic today. Husband recently had hip replacement and is doing fairly well with physical therapy and just recently went back to work.    Medication list has been reviewed and updated.  Current Meds  Medication Sig   albuterol (PROVENTIL) (2.5 MG/3ML) 0.083% nebulizer solution 2.5 mg.   ALPRAZolam (XANAX) 1 MG tablet Take 1 tablet (1 mg total) by mouth 3 (three) times daily as needed for anxiety. Need to see me before next refill.   medroxyPROGESTERone (DEPO-PROVERA) 150 MG/ML injection Inject 1 mL (150 mg total) into the muscle every 3 (three) months.   nystatin cream (MYCOSTATIN) Apply 1 Application topically 2 (two) times daily.    QUEtiapine (SEROQUEL) 300 MG tablet TAKE 1 TABLET(300 MG) BY MOUTH AT BEDTIME   triamcinolone cream (KENALOG) 0.1 % Apply topically 2 (two) times daily. Apply to hands for dyshidrotic eczema (AKA pompholyx)   [DISCONTINUED] clindamycin (CLEOCIN) 300 MG capsule Take 300 mg by mouth as needed.   [DISCONTINUED] naloxone (NARCAN) nasal spray 4 mg/0.1 mL Use one spray in either nostril once for known/suspected opioid overdose. May repeat every 2-3 minutes in alternating nostril til EMS arrives   [DISCONTINUED] valACYclovir (VALTREX) 1000 MG tablet    Current Facility-Administered Medications for the 12/26/22 encounter (Video Visit) with Denise Lipps, PA  Medication   medroxyPROGESTERone Acetate SUSY 150 mg     Review of Systems  Constitutional:  Positive for fatigue. Negative for fever.  Respiratory:  Negative for chest tightness and shortness of breath.   Cardiovascular:  Negative for chest pain and palpitations.  Gastrointestinal:  Negative for abdominal pain.  Psychiatric/Behavioral:  Positive for decreased concentration and sleep disturbance. Negative for dysphoric mood and suicidal ideas. The patient is nervous/anxious.     Patient Active Problem List   Diagnosis Date Noted   Depo-Provera contraceptive status 09/05/2022   Symptomatic mammary hypertrophy 08/30/2022   Endometriosis 08/30/2022   History of cervical dysplasia 08/30/2022   Dyshidrotic eczema 07/16/2022   Nephrolithiasis 08/31/2020   Generalized anxiety disorder with panic attacks 02/01/2017   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HCC) 11/14/2015   Lumbar degenerative disc disease 05/25/2014   Chronic pain associated with significant psychosocial dysfunction 05/25/2014   Class 3 severe obesity with serious comorbidity and body  mass index (BMI) of 40.0 to 44.9 in adult [facility]) 01/31/2014   COPD (chronic obstructive pulmonary disease) (HCC)    Chronic leg pain    Chronic back pain    Hypovitaminosis D  08/10/2013   Insomnia 07/20/2013   Paroxysmal nocturnal dyspnea 07/20/2013   Neuralgia neuritis, sciatic nerve 08/25/2012   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   Esophageal reflux 08/25/2012   Suicidal ideation 08/25/2012   History of TIA (transient ischemic attack) 08/25/2012   Polycystic ovaries 10/06/2008    Allergies  Allergen Reactions   Ceftriaxone Other (See Comments)    Tightness in chest, throat   Gabapentin Anaphylaxis   Naproxen Hives, Nausea And Vomiting and Other (See Comments)   Codeine Other (See Comments)   Ibuprofen Nausea And Vomiting and Hives   Methocarbamol Nausea And Vomiting   Other Other (See Comments)   Meloxicam Hives   Pregabalin     Other Reaction(s): made her pain worse   Gadolinium Derivatives Nausea And Vomiting    Immunization History  Administered Date(s) Administered   Tdap 05/28/2007    Past Surgical History:  Procedure Laterality Date   APPENDECTOMY      Social History   Tobacco Use   Smoking status: Some Days    Current packs/day: 0.50    Average packs/day: 0.5 packs/day for 18.0 years (9.0 ttl pk-yrs)    Types: Cigarettes   Smokeless tobacco: Never  Vaping Use   Vaping status: Never Used  Substance Use Topics   Alcohol use: No   Drug use: Not Currently    Family History  Problem Relation Age of Onset   Stroke Mother    Hypertension Mother    Ovarian cancer Mother    Diabetes Father    Heart disease Father    Diabetes Mellitus I Father    Heart disease Brother         12/26/2022    8:55 AM 08/29/2022    4:23 PM 07/16/2022    4:15 PM 07/12/2022    9:53 AM  GAD 7 : Generalized Anxiety Score  Nervous, Anxious, on Edge 3 3 3 3   Control/stop worrying 3 3 3 3   Worry too much - different things 3 3 3 3   Trouble relaxing 3 3 3 3   Restless 3 3 3 3   Easily annoyed or irritable 2 1 1 3   Afraid - awful might happen 2 2 2 3   Total GAD 7 Score 19 18 18  21   Anxiety Difficulty Very difficult Very difficult Extremely difficult Extremely difficult       12/26/2022    8:54 AM 08/29/2022    4:23 PM 07/16/2022    4:14 PM  Depression screen PHQ 2/9  Decreased Interest 0 2 2  Down, Depressed, Hopeless 0 2 2  PHQ - 2 Score 0 4 4  Altered sleeping 3 2 2   Tired, decreased energy 2 2 2   Change in appetite 2 1 1   Feeling bad or failure about yourself  0 2 2  Trouble concentrating 2 2 2   Moving slowly or fidgety/restless 0 2 2  Suicidal thoughts 0 0 0  PHQ-9 Score 9 15 15   Difficult doing work/chores Not difficult at all Very difficult Extremely dIfficult    BP Readings from Last 3 Encounters:  11/30/22 112/74  08/29/22 122/76  07/16/22 124/70    Wt Readings from Last 3 Encounters:  11/30/22 259 lb 9.6 oz (117.8 kg)  08/29/22 252 lb (114.3 kg)  07/16/22 265 lb 12.8 oz (120.6 kg)    Ht 5\' 6"  (1.676 m)   BMI 41.90 kg/m   Physical Exam General: Speaking full sentences, no audible heavy breathing. She presents to the visit in bed in a dark room with limited visibility. Speech is intact but she seems tired, says she had to call hospice at 4 a.m. for her mom.   Recent Labs     Component Value Date/Time   NA 143 08/19/2020 0000   NA 145 07/17/2011 1902   K 4.8 08/19/2020 0000   K 3.7 07/17/2011 1902   CL 104 08/19/2020 0000   CL 109 (H) 07/17/2011 1902   CO2 17 08/19/2020 0000   CO2 25 07/17/2011 1902   GLUCOSE 78 10/01/2017 0208   GLUCOSE 92 07/17/2011 1902   BUN 10 08/19/2020 0000   BUN 5 (L) 07/17/2011 1902   CREATININE 0.8 08/19/2020 0000   CREATININE 0.60 10/01/2017 0208   CREATININE 0.69 07/17/2011 1902   CALCIUM 8.9 08/19/2020 0000   CALCIUM 8.8 07/17/2011 1902   PROT 7.3 10/01/2017 0119   PROT 7.3 07/17/2011 1902   ALBUMIN 4.2 08/19/2020 0000   ALBUMIN 3.6 07/17/2011 1902   AST 13 08/19/2020 0000   AST 18 07/17/2011 1902   ALT 10 08/19/2020 0000   ALT 22 07/17/2011 1902   ALKPHOS 101 08/19/2020 0000   ALKPHOS 82  07/17/2011 1902   BILITOT 0.4 10/01/2017 0119   BILITOT 0.2 07/17/2011 1902   GFRNONAA >60 10/01/2017 0119   GFRNONAA >60 07/17/2011 1902   GFRAA >60 10/01/2017 0119   GFRAA >60 07/17/2011 1902    Lab Results  Component Value Date   WBC 12.8 (H) 01/31/2017   HGB 14.6 10/01/2017   HCT 43.0 10/01/2017   MCV 89.9 01/31/2017   PLT 390 01/31/2017   No results found for: "HGBA1C" No results found for: "CHOL", "HDL", "LDLCALC", "LDLDIRECT", "TRIG", "CHOLHDL" Lab Results  Component Value Date   TSH 1.900 02/01/2014     Assessment and Plan:  1. Generalized anxiety disorder with panic attacks PDMP reviewed, last filled alprazolam early 11/29/22 with 30-day supply, previous fill 7/13. Will refill only a 30-day supply which will be available no sooner than 12/28/22.  - ALPRAZolam (XANAX) 1 MG tablet; Take 1 tablet (1 mg total) by mouth 3 (three) times daily as needed for anxiety. Need to see me before next refill.  Dispense: 90 tablet; Refill: 0  2. Insomnia due to medical condition Refill seroquel as below.  - QUEtiapine (SEROQUEL) 300 MG tablet; Take 1 tablet (300 mg total) by mouth at bedtime.  Dispense: 30 tablet; Refill: 0   Follow-up as scheduled for in-Tiyon Sanor visit 01/24/2023 with urine drug screen. No exceptions.   I discussed the above assessment and treatment plan with the patient. The patient was provided an opportunity to ask questions and all were answered. The patient agreed with the plan and demonstrated an understanding of the instructions. The patient was advised to call back or seek an in-Edan Serratore evaluation if the symptoms worsen or if the condition fails to improve as anticipated. I provided a total time of 21 minutes inclusive of time utilized for medical chart review, information gathering, care coordination with staff, and documentation completion.  Alvester Morin, PA-C, DMSc, Nutritionist [facility] Primary Care and Sports Medicine [facility] [phone]

## 2023-01-14 ENCOUNTER — Other Ambulatory Visit: Payer: Self-pay

## 2023-01-14 DIAGNOSIS — F410 Panic disorder [episodic paroxysmal anxiety] without agoraphobia: Secondary | ICD-10-CM

## 2023-01-14 DIAGNOSIS — F332 Major depressive disorder, recurrent severe without psychotic features: Secondary | ICD-10-CM

## 2023-01-21 DIAGNOSIS — J449 Chronic obstructive pulmonary disease, unspecified: Secondary | ICD-10-CM | POA: Diagnosis not present

## 2023-01-24 ENCOUNTER — Ambulatory Visit: Payer: 59 | Admitting: Physician Assistant

## 2023-01-24 ENCOUNTER — Other Ambulatory Visit: Payer: Self-pay | Admitting: Physician Assistant

## 2023-01-24 NOTE — Telephone Encounter (Signed)
Medication Refill - Medication: Depo inj. With refills  Has the patient contacted their pharmacy? Yes.   (Agent: If no, request that the patient contact the pharmacy for the refill. If patient does not wish to contact the pharmacy document the reason why and proceed with request.) (Agent: If yes, when and what did the pharmacy advise?)  Preferred Pharmacy (with phone number or street name): [facility] [address] Has the patient been seen for an appointment in the last year OR does the patient have an upcoming appointment? Yes.    Agent: Please be advised that RX refills may take up to 3 business days. We ask that you follow-up with your pharmacy.

## 2023-01-24 NOTE — Telephone Encounter (Signed)
Requested medication (s) are due for refill today: Yes  Requested medication (s) are on the active medication list: Yes  Last refill:  07/24/22  Future visit scheduled: Yes  Notes to clinic:  Manual review.    Requested Prescriptions  Pending Prescriptions Disp Refills   medroxyPROGESTERone (DEPO-PROVERA) 150 MG/ML injection 1 mL 1    Sig: Inject 1 mL (150 mg total) into the muscle every 3 (three) months.     Off-Protocol Failed - 01/24/2023 12:09 PM      Failed - Medication not assigned to a protocol, review manually.      Passed - Valid encounter within last 12 months    Recent Outpatient Visits           4 weeks ago Generalized anxiety disorder with panic attacks   [place] Primary [facility] & Sports Medicine at [facility], [place] Alar, [place]   4 months ago Depo-Provera contraceptive status   [facility] Care & Sports Medicine at [facility], Melton Alar, [place]   6 months ago Foot pain, left   [facility] Primary Care & Sports Medicine at [facility], Melton Alar, [place]   6 months ago Chronic pain associated with significant psychosocial dysfunction   [facility] Primary Care & Sports Medicine at [facility], [place]ton Alar, [place]       Future Appointments             In 4 days [name], Melton Alar, PA [facility] Primary Care & Sports Medicine at [facility], [facility]

## 2023-01-25 MED ORDER — MEDROXYPROGESTERONE ACETATE 150 MG/ML IM SUSP: 150.0000 mg | INTRAMUSCULAR | 0 refills | Status: DC

## 2023-01-28 ENCOUNTER — Encounter: Payer: Self-pay | Admitting: Physician Assistant

## 2023-01-28 ENCOUNTER — Ambulatory Visit: Payer: 59 | Admitting: Physician Assistant

## 2023-01-28 ENCOUNTER — Ambulatory Visit (INDEPENDENT_AMBULATORY_CARE_PROVIDER_SITE_OTHER): Payer: 59 | Admitting: Physician Assistant

## 2023-01-28 VITALS — BP 112/82 | HR 99 | Temp 98.2°F | Ht 66.0 in | Wt 252.0 lb

## 2023-01-28 DIAGNOSIS — L301 Dyshidrosis [pompholyx]: Secondary | ICD-10-CM

## 2023-01-28 DIAGNOSIS — L304 Erythema intertrigo: Secondary | ICD-10-CM | POA: Diagnosis not present

## 2023-01-28 DIAGNOSIS — Z3042 Encounter for surveillance of injectable contraceptive: Secondary | ICD-10-CM | POA: Diagnosis not present

## 2023-01-28 DIAGNOSIS — Z79899 Other long term (current) drug therapy: Secondary | ICD-10-CM | POA: Diagnosis not present

## 2023-01-28 DIAGNOSIS — G8929 Other chronic pain: Secondary | ICD-10-CM

## 2023-01-28 DIAGNOSIS — M5442 Lumbago with sciatica, left side: Secondary | ICD-10-CM

## 2023-01-28 DIAGNOSIS — M797 Fibromyalgia: Secondary | ICD-10-CM | POA: Insufficient documentation

## 2023-01-28 DIAGNOSIS — M5441 Lumbago with sciatica, right side: Secondary | ICD-10-CM | POA: Diagnosis not present

## 2023-01-28 DIAGNOSIS — S63659A Sprain of metacarpophalangeal joint of unspecified finger, initial encounter: Secondary | ICD-10-CM | POA: Insufficient documentation

## 2023-01-28 DIAGNOSIS — E66813 Obesity, class 3: Secondary | ICD-10-CM | POA: Diagnosis not present

## 2023-01-28 DIAGNOSIS — G4701 Insomnia due to medical condition: Secondary | ICD-10-CM

## 2023-01-28 DIAGNOSIS — J449 Chronic obstructive pulmonary disease, unspecified: Secondary | ICD-10-CM | POA: Diagnosis not present

## 2023-01-28 DIAGNOSIS — M48061 Spinal stenosis, lumbar region without neurogenic claudication: Secondary | ICD-10-CM | POA: Insufficient documentation

## 2023-01-28 DIAGNOSIS — M654 Radial styloid tenosynovitis [de Quervain]: Secondary | ICD-10-CM | POA: Insufficient documentation

## 2023-01-28 DIAGNOSIS — Z6841 Body Mass Index (BMI) 40.0 and over, adult: Secondary | ICD-10-CM

## 2023-01-28 HISTORY — DX: Radial styloid tenosynovitis (de quervain): M65.4

## 2023-01-28 HISTORY — DX: Sprain of metacarpophalangeal joint of unspecified finger, initial encounter: S63.659A

## 2023-01-28 LAB — POCT URINE PREGNANCY: Preg Test, Ur: NEGATIVE

## 2023-01-28 MED ORDER — TRIAMCINOLONE ACETONIDE 0.1 % EX CREA: TOPICAL_CREAM | Freq: Two times a day (BID) | CUTANEOUS | 5 refills | Status: DC | PRN

## 2023-01-28 MED ORDER — ALPRAZOLAM 1 MG PO TABS: 1.0000 mg | ORAL_TABLET | Freq: Three times a day (TID) | ORAL | 2 refills | Status: DC | PRN

## 2023-01-28 MED ORDER — NYSTATIN 100000 UNIT/GM EX CREA
1.0000 | TOPICAL_CREAM | Freq: Two times a day (BID) | CUTANEOUS | 3 refills | Status: DC
Start: 2023-01-28 — End: 2023-06-04

## 2023-01-28 MED ORDER — MEDROXYPROGESTERONE ACETATE 150 MG/ML IM SUSP
150.0000 mg | INTRAMUSCULAR | 3 refills | Status: DC
Start: 2023-01-28 — End: 2023-05-15

## 2023-01-28 MED ORDER — ZEPBOUND 2.5 MG/0.5ML ~~LOC~~ SOAJ: 2.5000 mg | SUBCUTANEOUS | 0 refills | Status: DC

## 2023-01-28 MED ORDER — QUETIAPINE FUMARATE 300 MG PO TABS: 300.0000 mg | ORAL_TABLET | Freq: Every day | ORAL | 1 refills | Status: DC

## 2023-01-28 NOTE — Progress Notes (Signed)
Date:  01/28/2023   Name:  Denise Ellis   DOB:  23-Apr-1981   MRN:  [id number]   Chief Complaint: Insomnia, Anxiety, and Weight Loss (Wants injection )  HPI Denise Ellis returns for f/u on chronic conditions.  She is in a particularly tough spot emotionally because her mother passed away just over a week ago; she was in ill health and Denise Ellis was there when she passed.  Says she cannot sleep even with Xanax and Seroquel.  She has been referred to psychiatry but has not yet seen them, requests their information today.  She has also not gone to see pain management.  She no-showed 2 appointments with them so they said that if she wanted to be seen she would need to come as a walk-in and be worked into the schedule.  Overdue for Depo injection.  Last was done May 2024.  Brings her medication today.  Needs replacement nebulizer mask and tubing.  Wants Zepbound.  Denise Ellis previously denied but patient would like me to see if Zepbound can be approved.  Primary motivation for the weight loss is to alleviate some of her back pain.  Medication list has been reviewed and updated.  Current Meds  Medication Sig   albuterol (PROVENTIL) (2.5 MG/3ML) 0.083% nebulizer solution 2.5 mg.   Multiple Vitamin (MULTIVITAMIN) tablet Take 1 tablet by mouth daily.   tirzepatide (ZEPBOUND) 2.5 MG/0.5ML Pen Inject 2.5 mg into the skin once a week.   [DISCONTINUED] ALPRAZolam (XANAX) 1 MG tablet Take by mouth.   [DISCONTINUED] medroxyPROGESTERone (DEPO-PROVERA) 150 MG/ML injection Inject 1 mL (150 mg total) into the muscle every 3 (three) months.   [DISCONTINUED] nystatin cream (MYCOSTATIN) Apply 1 Application topically 2 (two) times daily.   [DISCONTINUED] QUEtiapine (SEROQUEL) 300 MG tablet Take 1 tablet (300 mg total) by mouth at bedtime.   [DISCONTINUED] triamcinolone cream (KENALOG) 0.1 % Apply topically 2 (two) times daily. Apply to hands for dyshidrotic eczema (AKA pompholyx)   Current Facility-Administered  Medications for the 01/28/23 encounter (Office Visit) with Remo Lipps, PA  Medication   medroxyPROGESTERone Acetate SUSY 150 mg     Review of Systems  Constitutional:  Positive for fatigue. Negative for fever.  Respiratory:  Negative for chest tightness and shortness of breath.   Cardiovascular:  Negative for chest pain and palpitations.  Gastrointestinal:  Negative for abdominal pain.  Psychiatric/Behavioral:  Positive for decreased concentration, dysphoric mood and sleep disturbance. Negative for self-injury and suicidal ideas. The patient is nervous/anxious.     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   Radial styloid tenosynovitis 01/28/2023   Spinal stenosis of lumbar region 01/28/2023   Sprain of metacarpophalangeal joint 01/28/2023   Chronic prescription benzodiazepine use 01/28/2023   Depo-Provera contraceptive status 09/05/2022   Symptomatic mammary hypertrophy 08/30/2022   Endometriosis 08/30/2022   History of cervical dysplasia 08/30/2022   Dyshidrotic eczema 07/16/2022   Chronic obstructive pulmonary disease (HCC) 07/31/2021   Nephrolithiasis 08/31/2020   Ureteric stone 05/11/2020   PTSD (post-traumatic stress disorder) 02/01/2017   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HCC) 11/14/2015   Chronic pain associated with significant psychosocial dysfunction 05/25/2014   Pain in limb 01/31/2014   Class 3 severe obesity with serious comorbidity and body mass index (BMI) of 40.0 to 44.9 in adult (HCC) 01/31/2014   Chronic leg pain    Chronic back pain    Hypovitaminosis D 08/10/2013   Insomnia 07/20/2013   Paroxysmal  nocturnal dyspnea 07/20/2013   Anxiety state 08/25/2012   Sciatica 08/25/2012   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   Esophageal reflux 08/25/2012   Suicidal ideation 08/25/2012   History of TIA (transient ischemic attack)  08/25/2012   Depressive disorder 06/13/2012   Degeneration of intervertebral disc of lumbar region 06/13/2012   Hand joint pain 03/28/2009   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   Polycystic ovaries 10/06/2008    Allergies  Allergen Reactions   Ceftriaxone Other (See Comments)    Tightness in chest, throat   Gabapentin Anaphylaxis   Naproxen Hives, Nausea And Vomiting and Other (See Comments)   Codeine Other (See Comments)   Ibuprofen Nausea And Vomiting and Hives   Methocarbamol Nausea And Vomiting   Other Other (See Comments)   Meloxicam Hives   Pregabalin     Other Reaction(s): made her pain worse   Gadolinium Derivatives Nausea And Vomiting    Immunization History  Administered Date(s) Administered   Tdap 05/28/2007    Past Surgical History:  Procedure Laterality Date   APPENDECTOMY      Social History   Tobacco Use   Smoking status: Some Days    Current packs/day: 0.50    Average packs/day: 0.5 packs/day for 18.0 years (9.0 ttl pk-yrs)    Types: Cigarettes   Smokeless tobacco: Never  Vaping Use   Vaping status: Never Used  Substance Use Topics   Alcohol use: No   Drug use: Not Currently    Family History  Problem Relation Age of Onset   Stroke Mother    Hypertension Mother    Ovarian cancer Mother    Diabetes Father    Heart disease Father    Diabetes Mellitus I Father    Heart disease Brother         01/28/2023    4:01 PM 12/26/2022    8:55 AM 08/29/2022    4:23 PM 07/16/2022    4:15 PM  GAD 7 : Generalized Anxiety Score  Nervous, Anxious, on Edge 3 3 3 3   Control/stop worrying 3 3 3 3   Worry too much - different things 2 3 3 3   Trouble relaxing 3 3 3 3   Restless 3 3 3 3   Easily annoyed or irritable 3 2 1 1   Afraid - awful might happen 3 2 2 2   Total GAD 7 Score 20 19 18 18   Anxiety Difficulty Extremely difficult Very difficult Very difficult Extremely difficult       01/28/2023    4:01 PM 12/26/2022    8:54 AM 08/29/2022    4:23 PM   Depression screen PHQ 2/9  Decreased Interest 2 0 2  Down, Depressed, Hopeless 2 0 2  PHQ - 2 Score 4 0 4  Altered sleeping 3 3 2   Tired, decreased energy 2 2 2   Change in appetite 2 2 1   Feeling bad or failure about yourself  3 0 2  Trouble concentrating 1 2 2   Moving slowly or fidgety/restless 1 0 2  Suicidal thoughts 0 0 0  PHQ-9 Score 16 9 15   Difficult doing work/chores Somewhat difficult Not difficult at all Very difficult    BP Readings from Last 3 Encounters:  01/28/23 112/82  11/30/22 112/74  08/29/22 122/76    Wt Readings from Last 3 Encounters:  01/28/23 252 lb (114.3 kg)  11/30/22 259 lb 9.6 oz (117.8 kg)  08/29/22 252 lb (114.3 kg)    BP  112/82   Pulse 99   Temp 98.2 F (36.8 C) (Oral)   Ht 5\' 6"  (1.676 m)   Wt 252 lb (114.3 kg)   SpO2 95%   BMI 40.67 kg/m   Physical Exam Vitals and nursing note reviewed.  Constitutional:      Appearance: Normal appearance.  Cardiovascular:     Rate and Rhythm: Normal rate and regular rhythm.     Heart sounds: No murmur heard.    No friction rub. No gallop.  Pulmonary:     Effort: Pulmonary effort is normal.     Breath sounds: Normal breath sounds.  Abdominal:     General: There is no distension.  Musculoskeletal:        General: Normal range of motion.  Skin:    General: Skin is warm and dry.  Neurological:     Mental Status: She is alert and oriented to person, place, and time.     Gait: Gait is intact.  Psychiatric:        Mood and Affect: Mood and affect normal.     Recent Labs     Component Value Date/Time   NA 143 08/19/2020 0000   NA 145 07/17/2011 1902   K 4.8 08/19/2020 0000   K 3.7 07/17/2011 1902   CL 104 08/19/2020 0000   CL 109 (H) 07/17/2011 1902   CO2 17 08/19/2020 0000   CO2 25 07/17/2011 1902   GLUCOSE 78 10/01/2017 0208   GLUCOSE 92 07/17/2011 1902   BUN 10 08/19/2020 0000   BUN 5 (L) 07/17/2011 1902   CREATININE 0.8 08/19/2020 0000   CREATININE 0.60 10/01/2017 0208    CREATININE 0.69 07/17/2011 1902   CALCIUM 8.9 08/19/2020 0000   CALCIUM 8.8 07/17/2011 1902   PROT 7.3 10/01/2017 0119   PROT 7.3 07/17/2011 1902   ALBUMIN 4.2 08/19/2020 0000   ALBUMIN 3.6 07/17/2011 1902   AST 13 08/19/2020 0000   AST 18 07/17/2011 1902   ALT 10 08/19/2020 0000   ALT 22 07/17/2011 1902   ALKPHOS 101 08/19/2020 0000   ALKPHOS 82 07/17/2011 1902   BILITOT 0.4 10/01/2017 0119   BILITOT 0.2 07/17/2011 1902   GFRNONAA >60 10/01/2017 0119   GFRNONAA >60 07/17/2011 1902   GFRAA >60 10/01/2017 0119   GFRAA >60 07/17/2011 1902    Lab Results  Component Value Date   WBC 12.8 (H) 01/31/2017   HGB 14.6 10/01/2017   HCT 43.0 10/01/2017   MCV 89.9 01/31/2017   PLT 390 01/31/2017   No results found for: "HGBA1C" No results found for: "CHOL", "HDL", "LDLCALC", "LDLDIRECT", "TRIG", "CHOLHDL" Lab Results  Component Value Date   TSH 1.900 02/01/2014     Assessment and Plan:  1. Insomnia due to medical condition Refill quetiapine as below, encouraged therapy, information given for the clinic where she was referred. - QUEtiapine (SEROQUEL) 300 MG tablet; Take 1 tablet (300 mg total) by mouth at bedtime.  Dispense: 90 tablet; Refill: 1  2. Chronic prescription benzodiazepine use Check urine drug screen today - Drug Screen, Urine  3. Class 3 severe obesity with serious comorbidity and body mass index (BMI) of 40.0 to 44.9 in adult, unspecified obesity type [facility]) Patient advised I believe that if her insurance denied Denise Ellis it will probably also deny Zepbound, but I will try.  I do think it would be helpful for her lose weight with multiple abilities including chronic back pain.   - tirzepatide (ZEPBOUND) 2.5 MG/0.5ML Pen; Inject 2.5  mg into the skin once a week.  Dispense: 2 mL; Refill: 0  4. Depo-Provera contraceptive status Urine pregnancy test negative today, Depo administered.  Needs to do this every 3 months, encouraged not to miss doses. We again discussed the  role of Depo in weight gain, but without it she has significant pelvic pain and dysmenorrhea due to endometriosis.  She is not interested in GYN procedures such as endometrial ablation or hysterectomy. - medroxyPROGESTERone (DEPO-PROVERA) 150 MG/ML injection; Inject 1 mL (150 mg total) into the muscle every 3 (three) months.  Dispense: 1 mL; Refill: 3 - POCT urine pregnancy  5. Chronic midline low back pain with bilateral sciatica Patient asked for another pain management referral today, but because I have submitted so many referrals for pain management for her, both at my current and previous practices, I informed her that she will need to go to [facility] as planned since we put in significant effort to arrange this.  She tell me it takes 1.5h each way to get there from her home, so I plugged in navigation in the room today and it should take no more than 45 minutes each direction.  Because of her family history of rheumatoid arthritis, I have ordered a rheumatoid panel as below to investigate autoimmune causes of her chronic pain - Rheumatoid factor - CYCLIC CITRUL PEPTIDE ANTIBODY, IGG/IGA - Sedimentation rate - C-reactive protein - ANA  6. Chronic obstructive pulmonary disease, unspecified COPD type (HCC) Patient was given replacement nebulizer mask and tubing today  7. Intertrigo Refill nystatin for as needed use as below - nystatin cream (MYCOSTATIN); Apply 1 Application topically 2 (two) times daily.  Dispense: 30 g; Refill: 3  8. Dyshidrotic eczema Refill triamcinolone for as needed use as below - triamcinolone cream (KENALOG) 0.1 %; Apply topically 2 (two) times daily as needed. Apply to hands for dyshidrotic eczema  Dispense: 30 g; Refill: 5     Return in about 6 weeks (around 03/11/2023) for OV virtual f/u chronic conditions.    Alvester Morin, PA-C, DMSc, Nutritionist [facility] Primary Care and Sports [facility] [phone]

## 2023-01-30 LAB — DRUG SCREEN, URINE
Amphetamines, Urine: NEGATIVE ng/mL
Barbiturate screen, urine: NEGATIVE ng/mL
Benzodiazepine Quant, Ur: POSITIVE ng/mL — AB
Cannabinoid Quant, Ur: NEGATIVE ng/mL
Cocaine (Metab.): NEGATIVE ng/mL
Opiate Quant, Ur: NEGATIVE ng/mL
PCP Quant, Ur: NEGATIVE ng/mL

## 2023-01-30 LAB — RHEUMATOID FACTOR: Rheumatoid fact SerPl-aCnc: <10 [IU]/mL (ref <14.0)

## 2023-01-30 LAB — ANA: ANA Titer 1: NEGATIVE

## 2023-01-30 LAB — SEDIMENTATION RATE: Sed Rate: 30 mm/h (ref 0–32)

## 2023-01-30 LAB — C-REACTIVE PROTEIN: CRP: 8 mg/L (ref 0–10)

## 2023-01-30 LAB — CYCLIC CITRUL PEPTIDE ANTIBODY, IGG/IGA: Cyclic Citrullin Peptide Ab: 6 U (ref 0–19)

## 2023-02-19 ENCOUNTER — Other Ambulatory Visit: Payer: Self-pay | Admitting: Physician Assistant

## 2023-02-20 NOTE — Telephone Encounter (Signed)
Requested medication (s) are due for refill today: No  Requested medication (s) are on the active medication list: Yes  Last refill:  01/28/23  Future visit scheduled: No  Notes to clinic:  Not delegated.    Requested Prescriptions  Pending Prescriptions Disp Refills   ALPRAZolam (XANAX) 1 MG tablet [Pharmacy Med Name: ALPRAZOLAM 1MG  TABLETS] 90 tablet     Sig: TAKE 1 TABLET(1 MG) BY MOUTH THREE TIMES DAILY AS NEEDED FOR ANXIETY     Not Delegated - Psychiatry: Anxiolytics/Hypnotics 2 Failed - 02/19/2023  5:44 PM      Failed - This refill cannot be delegated      Failed - Urine Drug Screen completed in last 360 days      Passed - Patient is not pregnant      Passed - Valid encounter within last 6 months    Recent Outpatient Visits           3 weeks ago Insomnia due to medical condition   [facility] Health Primary Care & Sports Medicine at [facility], [place], [place]   1 month ago Generalized anxiety disorder with panic attacks   [facility] Primary Care & Sports Medicine at [facility], [place], [place]   5 months ago Depo-Provera contraceptive status   [facility] Primary Care & Sports Medicine at [facility], Melton Alar, [place]   7 months ago Foot pain, left   [facility] Primary Care & Sports Medicine at [facility], [place], [place]   7 months ago Chronic pain associated with significant psychosocial dysfunction   [facility] Primary Care & Sports Medicine at [facility], Melton Alar, [place]

## 2023-02-20 NOTE — Telephone Encounter (Signed)
Please review.  KP

## 2023-02-21 ENCOUNTER — Telehealth: Payer: Self-pay | Admitting: Physician Assistant

## 2023-02-21 DIAGNOSIS — J449 Chronic obstructive pulmonary disease, unspecified: Secondary | ICD-10-CM | POA: Diagnosis not present

## 2023-02-21 NOTE — Telephone Encounter (Signed)
Pt is calling in requesting Dan's nurse give her a call as soon as possible. Please follow up with pt.

## 2023-02-21 NOTE — Telephone Encounter (Signed)
Called pt she wanted Dan to send her in medication for a fever blister. Told pt that she would need to be seen before any medication can be prescribed. Schedule pt an appt.  KP

## 2023-02-22 ENCOUNTER — Telehealth: Payer: 59 | Admitting: Family Medicine

## 2023-02-26 ENCOUNTER — Telehealth: Payer: Self-pay

## 2023-02-26 ENCOUNTER — Other Ambulatory Visit: Payer: Self-pay | Admitting: Physician Assistant

## 2023-02-26 DIAGNOSIS — R21 Rash and other nonspecific skin eruption: Secondary | ICD-10-CM

## 2023-02-26 MED ORDER — VALACYCLOVIR HCL 1 G PO TABS
ORAL_TABLET | ORAL | 2 refills | Status: DC
Start: 2023-02-26 — End: 2024-02-19

## 2023-02-26 NOTE — Telephone Encounter (Signed)
Please call pt to schedule an appointment for tomorrow afternoon.  KP

## 2023-02-26 NOTE — Progress Notes (Signed)
Patient called in reporting "fever blisters" of the face, started on the lip 2 days ago, but woke this morning with painful blisters on the eyelid and inside the nose, says this has happened before, no prior shingles to her knowledge.  Previously had a standing prescription for "cold sores", says Valtrex usually clears it up.  Requesting prescription for this.  I agreed to write prescription for Valtrex, but emphasized that I do still want to evaluate this problem in person.  Plan for appointment tomorrow afternoon.  Patient in agreement.

## 2023-02-26 NOTE — Telephone Encounter (Signed)
Noted  KP 

## 2023-02-27 ENCOUNTER — Ambulatory Visit: Payer: 59 | Admitting: Physician Assistant

## 2023-03-23 DIAGNOSIS — J449 Chronic obstructive pulmonary disease, unspecified: Secondary | ICD-10-CM | POA: Diagnosis not present

## 2023-04-15 ENCOUNTER — Ambulatory Visit: Payer: 59 | Admitting: Physician Assistant

## 2023-04-23 DIAGNOSIS — J449 Chronic obstructive pulmonary disease, unspecified: Secondary | ICD-10-CM | POA: Diagnosis not present

## 2023-05-07 ENCOUNTER — Other Ambulatory Visit: Payer: Self-pay | Admitting: Physician Assistant

## 2023-05-08 NOTE — Telephone Encounter (Signed)
 Please review.  KP

## 2023-05-08 NOTE — Telephone Encounter (Signed)
 Requested medication (s) are due for refill today:yes  Requested medication (s) are on the active medication list: yes  Last refill:  01/28/23 #90 1 RF  Future visit scheduled: no  Notes to clinic:  med not delegated to NT to RF   Requested Prescriptions  Pending Prescriptions Disp Refills   ALPRAZolam  (XANAX ) 1 MG tablet [Pharmacy Med Name: ALPRAZOLAM  1MG  TABLETS] 90 tablet     Sig: TAKE 1 TABLET(1 MG) BY MOUTH THREE TIMES DAILY AS NEEDED FOR ANXIETY     Not Delegated - Psychiatry: Anxiolytics/Hypnotics 2 Failed - 05/08/2023  1:19 PM      Failed - This refill cannot be delegated      Failed - Urine Drug Screen completed in last 360 days      Passed - Patient is not pregnant      Passed - Valid encounter within last 6 months    Recent Outpatient Visits           3 months ago Insomnia due to medical condition   [facility] Health Primary Care & Sports Medicine at [facility], Arleen Lacer, [place]   4 months ago Generalized anxiety disorder with panic attacks   [facility] Primary Care & Sports Medicine at [facility], [place], [place]   8 months ago Depo-Provera  contraceptive status   [facility] Health Primary Care & Sports Medicine at [facility], Arleen Lacer, [place]   9 months ago Foot pain, left   [facility] Health Primary Care & Sports Medicine at [facility], [place], [place]   10 months ago Chronic pain associated with significant psychosocial dysfunction   [facility] Health Primary Care & Sports Medicine at [facility], Arleen Lacer, [place]

## 2023-05-13 ENCOUNTER — Telehealth (INDEPENDENT_AMBULATORY_CARE_PROVIDER_SITE_OTHER): Payer: 59 | Admitting: Physician Assistant

## 2023-05-13 ENCOUNTER — Encounter: Payer: Self-pay | Admitting: Physician Assistant

## 2023-05-13 VITALS — Ht 66.0 in

## 2023-05-13 DIAGNOSIS — N809 Endometriosis, unspecified: Secondary | ICD-10-CM | POA: Diagnosis not present

## 2023-05-13 DIAGNOSIS — Z79899 Other long term (current) drug therapy: Secondary | ICD-10-CM

## 2023-05-13 DIAGNOSIS — G4701 Insomnia due to medical condition: Secondary | ICD-10-CM

## 2023-05-13 DIAGNOSIS — Z91199 Patient's noncompliance with other medical treatment and regimen due to unspecified reason: Secondary | ICD-10-CM | POA: Diagnosis not present

## 2023-05-13 DIAGNOSIS — F332 Major depressive disorder, recurrent severe without psychotic features: Secondary | ICD-10-CM | POA: Diagnosis not present

## 2023-05-13 DIAGNOSIS — Z3042 Encounter for surveillance of injectable contraceptive: Secondary | ICD-10-CM

## 2023-05-13 MED ORDER — QUETIAPINE FUMARATE 300 MG PO TABS: 450.0000 mg | ORAL_TABLET | Freq: Every day | ORAL | 1 refills | Status: DC

## 2023-05-13 NOTE — Progress Notes (Signed)
Date:  05/13/2023   Name:  Denise Ellis   DOB:  07/23/1980   MRN:  [id number]   I connected with Denise Ellis on 05/13/23 via MyChart Video and verified that I am speaking with the correct person using appropriate identifiers. The limitations, risks, security and privacy concerns of performing an evaluation and management service by MyChart Video, including the higher likelihood of inaccurate diagnoses and treatments, and the availability of in person appointments were reviewed. The possible need of an additional face-to-face encounter for complete and high quality delivery of care was discussed. The patient was also made aware that there may be a patient responsible charge related to this service. The patient expressed understanding and wishes to proceed.   Provider location is in medical facility [facility] Medicine at [facility]). Patient location is at their home People involved in care of the patient during this telehealth encounter were myself, my CMA, and my front office/scheduling team member.    Chief Complaint: Anxiety and depression   HPI Denise Ellis presents today for follow-up on chronic conditions including depression, anxiety, insomnia, endometriosis, chronic pain.   Acute exacerbation of depression and anxiety following the death of her mother several months ago.  Having a very hard time with sleep onset and maintenance.  Requesting dose increase for Seroquel.    Considering alternative management of her endometriosis, currently overdue for Depo with last injection 01/28/2023.  Has been on Depo for years and we have previously discussed potential for weight gain on this medication.  In the past she has told me that she is not interested in endometrial ablation or hysterectomy.  Wonders about having children, but does not think she could ever go without Xanax.  Back in May she was referred to [facility], but she no-showed first appointment.    Patient asks again if she can increase dose of Xanax, currently using 1 mg 3 times daily.  Has previously been told that I am not willing to increase the dose of this medication.  She has also not gone to see pain management at [facility].  She no-showed 2 appointments with them so they said that if she wanted to be seen she would need to come as a walk-in and be worked into the schedule. She has not done this and continues to complain of chronic pain.   Patient continues no showing specialist appointments, effectively neutralizing referrals.  Says she has a lot going on, but this was also a problem when she was my patient in [place], [place].   Medication list has been reviewed and updated.  Current Meds  Medication Sig   albuterol (PROVENTIL) (2.5 MG/3ML) 0.083% nebulizer solution 2.5 mg.   ALPRAZolam (XANAX) 1 MG tablet TAKE 1 TABLET(1 MG) BY MOUTH THREE TIMES DAILY AS NEEDED FOR ANXIETY   medroxyPROGESTERone (DEPO-PROVERA) 150 MG/ML injection Inject 1 mL (150 mg total) into the muscle every 3 (three) months.   Multiple Vitamin (MULTIVITAMIN) tablet Take 1 tablet by mouth daily.   nystatin cream (MYCOSTATIN) Apply 1 Application topically 2 (two) times daily.   tirzepatide (ZEPBOUND) 2.5 MG/0.5ML Pen Inject 2.5 mg into the skin once a week.   triamcinolone cream (KENALOG) 0.1 % Apply topically 2 (two) times daily as needed. Apply to hands for dyshidrotic eczema   [DISCONTINUED] QUEtiapine (SEROQUEL) 300 MG tablet Take 1 tablet (300 mg total) by mouth at bedtime.   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   Radial styloid tenosynovitis 01/28/2023   Spinal stenosis of lumbar region 01/28/2023   Sprain of metacarpophalangeal joint 01/28/2023   Chronic  prescription benzodiazepine use 01/28/2023   Depo-Provera contraceptive status 09/05/2022   Symptomatic mammary hypertrophy 08/30/2022   Endometriosis 08/30/2022   History of cervical dysplasia 08/30/2022   Dyshidrotic eczema 07/16/2022   Chronic obstructive pulmonary disease (HCC) 07/31/2021   Nephrolithiasis 08/31/2020   Ureteric stone 05/11/2020   PTSD (post-traumatic stress disorder) 02/01/2017   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HCC) 11/14/2015   Chronic pain associated with significant psychosocial dysfunction 05/25/2014   Pain in limb 01/31/2014   Class 3 severe obesity with serious comorbidity and body mass index (BMI) of 40.0 to 44.9 in adult (HCC) 01/31/2014   Chronic leg pain    Chronic back pain    Hypovitaminosis D 08/10/2013   Insomnia 07/20/2013   Paroxysmal nocturnal dyspnea 07/20/2013   Anxiety state 08/25/2012   Sciatica 08/25/2012   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   Esophageal reflux 08/25/2012   Suicidal ideation 08/25/2012   History of TIA (transient ischemic attack) 08/25/2012   Depressive disorder 06/13/2012   Degeneration of intervertebral disc of lumbar region 06/13/2012   Hand joint pain 03/28/2009   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   Polycystic ovaries 10/06/2008    Allergies  Allergen Reactions   Ceftriaxone Other (See Comments)    Tightness in chest, throat   Gabapentin Anaphylaxis   Naproxen Hives, Nausea And Vomiting and Other (See Comments)   Codeine Other (See Comments)   Ibuprofen Nausea And Vomiting and Hives   Methocarbamol Nausea And Vomiting   Other Other (See Comments)   Meloxicam Hives   Pregabalin     Other Reaction(s): made her pain worse   Gadolinium Derivatives Nausea And Vomiting    Immunization History  Administered Date(s) Administered   Tdap 05/28/2007    Past Surgical History:  Procedure Laterality Date   APPENDECTOMY       Social History   Tobacco Use   Smoking status: Some Days    Current packs/day: 0.50    Average packs/day: 0.5 packs/day for 18.0 years (9.0 ttl pk-yrs)    Types: Cigarettes   Smokeless tobacco: Never  Vaping Use   Vaping status: Never Used  Substance Use Topics   Alcohol use: No   Drug use: Not Currently    Family History  Problem Relation Age of Onset   Stroke Mother    Hypertension Mother    Ovarian cancer Mother    Diabetes Father    Heart disease Father    Diabetes Mellitus I Father    Heart disease Brother         05/13/2023   11:44 AM 01/28/2023    4:01 PM 12/26/2022    8:55 AM 08/29/2022    4:23 PM  GAD 7 : Generalized Anxiety Score  Nervous, Anxious, on Edge 3 3 3 3   Control/stop worrying 3 3 3 3   Worry too much - different things 3 2 3 3   Trouble relaxing 3 3 3 3   Restless 3 3 3 3   Easily annoyed or irritable 3 3 2 1   Afraid - awful might happen 3 3 2 2   Total GAD 7 Score  21 20 19 18   Anxiety Difficulty Very difficult Extremely difficult Very difficult Very difficult       05/13/2023   11:44 AM 01/28/2023    4:01 PM 12/26/2022    8:54 AM  Depression screen PHQ 2/9  Decreased Interest 3 2 0  Down, Depressed, Hopeless 3 2 0  PHQ - 2 Score 6 4 0  Altered sleeping 3 3 3   Tired, decreased energy 3 2 2   Change in appetite 3 2 2   Feeling bad or failure about yourself  3 3 0  Trouble concentrating 1 1 2   Moving slowly or fidgety/restless 0 1 0  Suicidal thoughts 0 0 0  PHQ-9 Score 19 16 9   Difficult doing work/chores Very difficult Somewhat difficult Not difficult at all    BP Readings from Last 3 Encounters:  01/28/23 112/82  11/30/22 112/74  08/29/22 122/76    Wt Readings from Last 3 Encounters:  01/28/23 252 lb (114.3 kg)  11/30/22 259 lb 9.6 oz (117.8 kg)  08/29/22 252 lb (114.3 kg)    Ht 5\' 6"  (1.676 m)   BMI 40.67 kg/m   Physical Exam General: Speaking full sentences, no audible heavy breathing. Sounds alert and appropriately  interactive. Well-appearing. Face symmetric. Extraocular movements intact. Pupils equal and round. No nasal flaring or accessory muscle use visualized.  Recent Labs     Component Value Date/Time   NA 143 08/19/2020 0000   NA 145 07/17/2011 1902   K 4.8 08/19/2020 0000   K 3.7 07/17/2011 1902   CL 104 08/19/2020 0000   CL 109 (H) 07/17/2011 1902   CO2 17 08/19/2020 0000   CO2 25 07/17/2011 1902   GLUCOSE 78 10/01/2017 0208   GLUCOSE 92 07/17/2011 1902   BUN 10 08/19/2020 0000   BUN 5 (L) 07/17/2011 1902   CREATININE 0.8 08/19/2020 0000   CREATININE 0.60 10/01/2017 0208   CREATININE 0.69 07/17/2011 1902   CALCIUM 8.9 08/19/2020 0000   CALCIUM 8.8 07/17/2011 1902   PROT 7.3 10/01/2017 0119   PROT 7.3 07/17/2011 1902   ALBUMIN 4.2 08/19/2020 0000   ALBUMIN 3.6 07/17/2011 1902   AST 13 08/19/2020 0000   AST 18 07/17/2011 1902   ALT 10 08/19/2020 0000   ALT 22 07/17/2011 1902   ALKPHOS 101 08/19/2020 0000   ALKPHOS 82 07/17/2011 1902   BILITOT 0.4 10/01/2017 0119   BILITOT 0.2 07/17/2011 1902   GFRNONAA >60 10/01/2017 0119   GFRNONAA >60 07/17/2011 1902   GFRAA >60 10/01/2017 0119   GFRAA >60 07/17/2011 1902    Lab Results  Component Value Date   WBC 12.8 (H) 01/31/2017   HGB 14.6 10/01/2017   HCT 43.0 10/01/2017   MCV 89.9 01/31/2017   PLT 390 01/31/2017   No results found for: "HGBA1C" No results found for: "CHOL", "HDL", "LDLCALC", "LDLDIRECT", "TRIG", "CHOLHDL" Lab Results  Component Value Date   TSH 1.900 02/01/2014     Assessment and Plan:  1. Chronic prescription benzodiazepine use (Primary) Reemphasized that I will not be increasing her alprazolam dose now or at any point in the future.  2. Insomnia due to medical condition Will increase quetiapine as below, maximum 600 mg per night.  Referring to psychiatry for further management. - QUEtiapine (SEROQUEL) 300 MG tablet; Take 1.5-2 tablets (450-600 mg total) by mouth at bedtime.  Dispense: 180 tablet;  Refill: 1 - Ambulatory referral to Psychiatry  3. Severe episode of recurrent major depressive disorder, without psychotic features (HCC) Plan  as above - Ambulatory referral to Psychiatry  4. Endometriosis Patient advised to contact OB/GYN, where she has already been referred.  Number given via MyChart message.  She is to let me know if she needs a new referral on account of missing her first appointment.   Unfortunately discussed that chance of successful uncomplicated pregnancy is unlikely due to advanced maternal age, endometriosis, chronic benzodiazepine use, and chronic pain.  Definitive management for endometriosis would be hysterectomy.  I will allow her to discuss this in more detail with her specialist.  5. Depo-Provera contraceptive status Patient is not in-person today, overdue for Depo, unsure if she wants to continue this management.  6. Noncompliance with treatment Patient advised that if she no-shows any more specialist visits, I will not be submitting any more referrals.   Return in about 3 months (around 08/11/2023) for OV f/u chronic conditions.   I discussed the above assessment and treatment plan with the patient. The patient was provided an opportunity to ask questions and all were answered. The patient agreed with the plan and demonstrated an understanding of the instructions. The patient was advised to call back or seek an in-person evaluation if the symptoms worsen or if the condition fails to improve as anticipated. I provided a total time of 35 minutes inclusive of time utilized for medical chart review, information gathering, care coordination with staff, and documentation completion.  Alvester Morin, PA-C, DMSc, Nutritionist [facility] [phone]

## 2023-05-13 NOTE — Patient Instructions (Signed)
-  It was a pleasure to see you today! Please review your visit summary for helpful information -I would encourage you to follow your care via MyChart where you can access lab results, notes, messages, and more -If you feel that we did a nice job today, please complete your after-visit survey and leave [place] a Google review! Your CMA today was Mariann Barter and your provider was Alvester Morin, PA-C, DMSc -Please return for follow-up in about 3 months, in-person

## 2023-05-14 ENCOUNTER — Telehealth: Payer: Self-pay | Admitting: Physician Assistant

## 2023-05-14 NOTE — Telephone Encounter (Signed)
Medication Refill -  Most Recent Primary Care Visit:  Provider: Remo Lipps  Department: [facility]  Visit Type: MYCHART VIDEO VISIT  Date: 05/13/2023  Medication: medroxyPROGESTERone (DEPO-PROVERA) 150 MG/ML injectio   Has the patient contacted their pharmacy? yes (Agent: If yes, when and what did the pharmacy advise?)contact pcp  Is this the correct pharmacy for this prescription? yes  This is the patient's preferred pharmacy:  [id number] - [place], [place] - [address] AT [facility] [address] [facility] [address] [place] [address] Phone: [phone] Fax: [phone]    Has the prescription been filled recently? no  Is the patient out of the medication? yes  Has the patient been seen for an appointment in the last year OR does the patient have an upcoming appointment? yes  Can we respond through MyChart? yes  Agent: Please be advised that Rx refills may take up to 3 business days. We ask that you follow-up with your pharmacy.

## 2023-05-15 ENCOUNTER — Other Ambulatory Visit: Payer: Self-pay | Admitting: Physician Assistant

## 2023-05-15 ENCOUNTER — Telehealth: Payer: Self-pay | Admitting: Physician Assistant

## 2023-05-15 DIAGNOSIS — Z3042 Encounter for surveillance of injectable contraceptive: Secondary | ICD-10-CM

## 2023-05-15 MED ORDER — MEDROXYPROGESTERONE ACETATE 150 MG/ML IM SUSP: 150.0000 mg | INTRAMUSCULAR | 1 refills | Status: DC

## 2023-05-15 NOTE — Telephone Encounter (Signed)
Copied from CRM [id number]. Topic: General - Other >> May 15, 2023  8:49 AM Everette C wrote: Reason for CRM: The patient has called for an update on the status of their previously requested prescription for medroxyPROGESTERone (DEPO-PROVERA) 150 MG/ML injection [[id number]]  Please contact the patient further when possible

## 2023-05-15 NOTE — Telephone Encounter (Signed)
Called patient and spoke with her informing she is overdue for her shot. She needs to come in for a pregnancy test before we can restart her on depo. She declined saying she will just go to "the health department to get my depo."  Dan informed as well.

## 2023-05-15 NOTE — Telephone Encounter (Signed)
Called pt left VM to call back. If pt wants she can just be seen for a nurse visit just to give her the depo shot she does not need a office visit for her depo. Pt is scheduled on 05/20/23. If pt prefers she can schedule for a nurse only visit.  KP

## 2023-05-20 ENCOUNTER — Ambulatory Visit: Payer: 59 | Admitting: Physician Assistant

## 2023-05-22 ENCOUNTER — Telehealth: Payer: Self-pay | Admitting: Physician Assistant

## 2023-05-22 NOTE — Telephone Encounter (Signed)
Copied from CRM [id number]. Topic: General - Other >> May 22, 2023  2:57 PM Tiffany B wrote: Jethro Bolus will be be faxing over a clinical recommendation form to  [phone], the header will reflect [facility] The prescribing recommendation request for benzodiazepines

## 2023-05-22 NOTE — Telephone Encounter (Signed)
Noted  KP

## 2023-05-24 DIAGNOSIS — J449 Chronic obstructive pulmonary disease, unspecified: Secondary | ICD-10-CM | POA: Diagnosis not present

## 2023-05-31 NOTE — Telephone Encounter (Signed)
 Please call pt to schedule an appt.  KP

## 2023-06-03 ENCOUNTER — Ambulatory Visit: Payer: 59 | Admitting: Physician Assistant

## 2023-06-03 NOTE — Progress Notes (Signed)
GYNECOLOGY: ANNUAL EXAM   Subjective:    PCP: Denise Lipps, PA Denise Ellis is a 43 y.o. female G0P0000 who presents for annual wellness visit. Would also like to discuss chronic hx of getting boils under her breasts, not currently in acute stage. Uses Nystatin cream and doesn't help. She has been approved for breast reduction but is not sure she'll go through with surgery.   Also hx of endometriosis, has been on Depo for the past 44yrs and is amenorrheic. She did a trial off of it for 1 year and HMB and pelvic pain returned, so she went back on. States is has caused her to gain 100lbs. She cannot remember daily pill and does not want an IUD, her PCP told her to consider hysterectomy, but she isn't sure she wants surgery.   Well Woman Visit:  GYN HISTORY:  No LMP recorded. Patient has had an injection.     Menstrual History: OB History     Gravida  0   Para  0   Term  0   Preterm  0   AB  0   Living  0      SAB  0   IAB  0   Ectopic  0   Multiple  0   Live Births  0           Menarche age: 69 No LMP recorded. Patient has had an injection.     Urinary incontinence? no  Sexually active: yes Number of sexual partners: 1 Gender of sexual Partners: male  Social History   Substance and Sexual Activity  Sexual Activity Yes   Contraceptive methods: Depo-Provera, managed by PCP Dyspareunia? Yes, STI history: no STI/HIV testing or immunizations needed? No.   Health Maintenance: -Last pap: approximate date 07/26/21 and was normal  --> Any abnormals: yes has had a colpo in 2000 -Last mammogram: ordered today  --> Any abnormals? N/a -Last colon cancer screen:  / Type: n -Last DEXA scan: no -[facility] of Breast / Colon / Cervical cancer: mom ovarian  -Vaccines:  Immunization History  Administered Date(s) Administered   Tdap 05/28/2007   Last Tdap: utd / Flu: no / COVID: no / Gardasil: no / Shingles (50+): no / PCV20: no -Hep C screen:  completed -Last lipid / glucose screening: today  > Exercise: none, not active > Dietary Supplements: Folate: No;  Calcium: No}; Vitamin D: No > Body mass index is 39.16 kg/m.  > Recent dental visit Yes.   > Seat Belt Use: Yes.   > Texting and driving? No. > Guns in the house Yes.   > Recreational or other drug use: denied.   Social History   Tobacco Use   Smoking status: Some Days    Current packs/day: 0.50    Average packs/day: 0.5 packs/day for 18.0 years (9.0 ttl pk-yrs)    Types: Cigarettes   Smokeless tobacco: Never  Substance Use Topics   Alcohol use: No   Occupation: disabled     Lives with: husband    PHQ-2 Score: In last two weeks, how often have you felt: Little interest or pleasure in doing things: Nearly every day (+3) Feeling down, depressed or hopeless: Nearly every day (+3) Score:   GAD-2 Over the last 2 weeks, how often have you been bothered by the following problems? Feeling nervous, anxious or on edge: Nearly every day (+3) Not being able to stop or control worrying: Nearly every day (+3)} Score: _________________________________________________________  Current Outpatient Medications  Medication Sig Dispense Refill   albuterol (PROVENTIL) (2.5 MG/3ML) 0.083% nebulizer solution 2.5 mg. 75 mL 1   medroxyPROGESTERone (DEPO-PROVERA) 150 MG/ML injection Inject 1 mL (150 mg total) into the muscle every 3 (three) months. 1 mL 1   QUEtiapine (SEROQUEL) 300 MG tablet Take 1.5-2 tablets (450-600 mg total) by mouth at bedtime. 180 tablet 1   ALPRAZolam (XANAX) 1 MG tablet Take 1 tablet (1 mg total) by mouth 3 (three) times daily as needed for anxiety. 90 tablet 2   nystatin cream (MYCOSTATIN) Apply 1 Application topically 2 (two) times daily. 30 g 3   Semaglutide-Weight Management (WEGOVY) 0.25 MG/0.5ML SOAJ Inject 0.25 mg into the skin once a week. 2 mL 0   triamcinolone cream (KENALOG) 0.1 % Apply topically 2 (two) times daily as needed. Apply to hands for  dyshidrotic eczema 30 g 5   Current Facility-Administered Medications  Medication Dose Route Frequency Provider Last Rate Last Admin   medroxyPROGESTERone Acetate SUSY 150 mg  150 mg Intramuscular Q90 days Denise Lipps, PA   150 mg at 01/28/23 1634   Allergies  Allergen Reactions   Ceftriaxone Other (See Comments)    Tightness in chest, throat   Gabapentin Anaphylaxis   Naproxen Hives, Nausea And Vomiting and Other (See Comments)   Codeine Other (See Comments)   Ibuprofen Nausea And Vomiting and Hives   Methocarbamol Nausea And Vomiting   Other Other (See Comments)   Meloxicam Hives   Pregabalin     Other Reaction(s): made her pain worse   Gadolinium Derivatives Nausea And Vomiting    Past Medical History:  Diagnosis Date   Anxiety    Chronic back pain    Do NOT give narcotic RX!  CALLArlee Ellis, [phone], her pain specialist before giving any additional prescriptions   Chronic leg pain    right leg "I don't have much feeling in it"   COPD (chronic obstructive pulmonary disease) (HCC)    possible.     Crohn's disease (HCC)    DDD (degenerative disc disease)    Depression    refused psychiatry, counseling, or any medications other than benzos   Stroke [facility])    Past Surgical History:  Procedure Laterality Date   APPENDECTOMY      Review Of Systems  Constitutional: Denied constitutional symptoms, night sweats, recent illness, fatigue, fever, insomnia and weight loss.  Eyes: Denied eye symptoms, eye pain, photophobia, vision change and visual disturbance.  Ears/Nose/Throat/Neck: Denied ear, nose, throat or neck symptoms, hearing loss, nasal discharge, sinus congestion and sore throat.  Cardiovascular: Denied cardiovascular symptoms, arrhythmia, chest pain/pressure, edema, exercise intolerance, orthopnea and palpitations.  Respiratory: Denied pulmonary symptoms, asthma, pleuritic pain, productive sputum, cough, dyspnea and wheezing.  Gastrointestinal:  Denied, gastro-esophageal reflux, melena, nausea and vomiting.  Genitourinary: Heavy periods without Depo  Musculoskeletal: Denied musculoskeletal symptoms, stiffness, swelling, muscle weakness and myalgia.  Dermatologic: Denied dermatology symptoms, rash and scar.  Neurologic: Denied neurology symptoms, dizziness, headache, neck pain and syncope.  Psychiatric: Denied psychiatric symptoms, anxiety and depression.  Endocrine: Denied endocrine symptoms including hot flashes and night sweats.      Objective:    BP 126/77   Pulse 88   Ht 5\' 7"  (1.702 m)   Wt 250 lb (113.4 kg)   BMI 39.16 kg/m   Constitutional: Morbidly obese, well-nourished female in no acute distress; malodorous Neurological: Alert and oriented to person, place, and time Psychiatric: Mood and affect appropriate Neck: Supple  without masses. Trachea is midline.Thyroid is normal size without masses Lymphatics: No cervical, axillary, supraclavicular, or inguinal adenopathy noted Respiratory: Clear to auscultation bilaterally. Good air movement with normal work of breathing. Cardiovascular: Regular rate and rhythm. Extremities grossly normal, nontender with no edema; pulses regular Gastrointestinal: Soft, nontender, nondistended. No masses or hernias appreciated. No hepatosplenomegaly. No fluid wave. No rebound or guarding. Breast Exam: normal appearance, no masses or tenderness, Inspection negative, No nipple retraction or dimpling, No nipple discharge or bleeding, No axillary or supraclavicular adenopathy, scarring on inframammary surfaces, no pustules or rash Genitourinary:         External Genitalia: Normal female genitalia    Vagina: Normal mucosa, no lesions.    Cervix: No lesions, normal size and consistency; no cervical motion tenderness; non-friable; Pap not obtained.    Uterus: Normal size and contour; smooth, mobile, NT. Adnexae: Non-palpable and non-tender Perineum/Anus: No lesions Rectal: deferred     Assessment/Plan:    Denise Ellis is a 43 y.o. female G0P0000 with normal well-woman gynecologic exam.  -Screenings:  Pap: due 07/2026 Mammogram: ordered Colon: PCP Labs: PCP GAD/PHQ-2 = 0 -Contraception: Depo, managed by PCP -Vaccines: Declines all -Healthy lifestyle modifications discussed: multivitamin, diet, exercise, sunscreen, tobacco and alcohol use. Emphasized importance of regular physical activity.  -Folate and Calcium and Vit D recommendation reviewed.   Breast boils: currently stable, no acute lesions -Discussed mainstay of prevention is keeping inframammary area clean and dry.  -Needs to wash twice daily, blow dry with hair dryer, put talc-free powder under breasts, and wicking liners in her bra. -Can use Chlorhexidine wash as body wash -Run bras on sterilization cycle in washer -Strongly encouraged pt to consider breast reduction surgery that she is already approved for. She is unsure due to possible alteration of nipple appearance.   Endometriosis: clinically diagnosed 20 yrs ago, stable on Depo and declines other medical management options  -Continue Depo, managed by PCP -Will obtain pelvic US to ensure no other underlying pathology -Will contact with results; consider surgical consult if desiring hysterectomy   Return in about 1 year (around 06/03/2024) for Annual. Sooner for other concerns.     Julieanne Manson, DO [facility]

## 2023-06-04 ENCOUNTER — Encounter: Payer: Self-pay | Admitting: Physician Assistant

## 2023-06-04 ENCOUNTER — Ambulatory Visit (INDEPENDENT_AMBULATORY_CARE_PROVIDER_SITE_OTHER): Payer: 59 | Admitting: Obstetrics

## 2023-06-04 ENCOUNTER — Encounter: Payer: Self-pay | Admitting: Obstetrics

## 2023-06-04 ENCOUNTER — Telehealth: Payer: Self-pay | Admitting: Physician Assistant

## 2023-06-04 ENCOUNTER — Ambulatory Visit: Payer: 59 | Admitting: Physician Assistant

## 2023-06-04 VITALS — BP 110/74 | HR 112 | Temp 98.3°F | Ht 66.0 in | Wt 250.0 lb

## 2023-06-04 VITALS — BP 126/77 | HR 88 | Ht 67.0 in | Wt 250.0 lb

## 2023-06-04 DIAGNOSIS — E66813 Obesity, class 3: Secondary | ICD-10-CM

## 2023-06-04 DIAGNOSIS — N611 Abscess of the breast and nipple: Secondary | ICD-10-CM | POA: Insufficient documentation

## 2023-06-04 DIAGNOSIS — L301 Dyshidrosis [pompholyx]: Secondary | ICD-10-CM

## 2023-06-04 DIAGNOSIS — L304 Erythema intertrigo: Secondary | ICD-10-CM

## 2023-06-04 DIAGNOSIS — Z01419 Encounter for gynecological examination (general) (routine) without abnormal findings: Secondary | ICD-10-CM

## 2023-06-04 DIAGNOSIS — B002 Herpesviral gingivostomatitis and pharyngotonsillitis: Secondary | ICD-10-CM

## 2023-06-04 DIAGNOSIS — Z3042 Encounter for surveillance of injectable contraceptive: Secondary | ICD-10-CM

## 2023-06-04 DIAGNOSIS — Z131 Encounter for screening for diabetes mellitus: Secondary | ICD-10-CM

## 2023-06-04 DIAGNOSIS — F411 Generalized anxiety disorder: Secondary | ICD-10-CM

## 2023-06-04 DIAGNOSIS — Z6839 Body mass index (BMI) 39.0-39.9, adult: Secondary | ICD-10-CM

## 2023-06-04 DIAGNOSIS — F410 Panic disorder [episodic paroxysmal anxiety] without agoraphobia: Secondary | ICD-10-CM

## 2023-06-04 DIAGNOSIS — N809 Endometriosis, unspecified: Secondary | ICD-10-CM | POA: Diagnosis not present

## 2023-06-04 DIAGNOSIS — Z13220 Encounter for screening for lipoid disorders: Secondary | ICD-10-CM

## 2023-06-04 DIAGNOSIS — Z1231 Encounter for screening mammogram for malignant neoplasm of breast: Secondary | ICD-10-CM

## 2023-06-04 DIAGNOSIS — Z6841 Body Mass Index (BMI) 40.0 and over, adult: Secondary | ICD-10-CM

## 2023-06-04 LAB — POCT URINE PREGNANCY: Preg Test, Ur: NEGATIVE

## 2023-06-04 MED ORDER — ALPRAZOLAM 1 MG PO TABS: 1.0000 mg | ORAL_TABLET | Freq: Three times a day (TID) | ORAL | 2 refills | Status: DC | PRN

## 2023-06-04 MED ORDER — TRIAMCINOLONE ACETONIDE 0.1 % EX CREA
TOPICAL_CREAM | Freq: Two times a day (BID) | CUTANEOUS | 5 refills | Status: AC | PRN
Start: 2023-06-04 — End: ?

## 2023-06-04 MED ORDER — WEGOVY 0.25 MG/0.5ML ~~LOC~~ SOAJ: 0.2500 mg | SUBCUTANEOUS | 0 refills | Status: DC

## 2023-06-04 MED ORDER — NYSTATIN 100000 UNIT/GM EX CREA: 1.0000 | TOPICAL_CREAM | Freq: Two times a day (BID) | CUTANEOUS | 3 refills | Status: AC

## 2023-06-04 MED ORDER — MEDROXYPROGESTERONE ACETATE 150 MG/ML IM SUSP
150.0000 mg | Freq: Once | INTRAMUSCULAR | Status: AC
  Administered 2023-06-04: 150 mg via INTRAMUSCULAR

## 2023-06-04 NOTE — Assessment & Plan Note (Signed)
Refill Xanax.  Unable to prescribe controlled sleep aids with Xanax use.

## 2023-06-04 NOTE — Telephone Encounter (Signed)
I'd like to try doing another prior [place] for [facility]. We did it last year and it was denied but I hope she can get on it. Dx is class 3 severe obesity with serious comorbidity. Comorbidities include MDD, osteoarthritis, COPD

## 2023-06-04 NOTE — Assessment & Plan Note (Signed)
Active outbreak on lip today, this is day 3.  Advised to continue high-dose valacyclovir as prescribed, might add Abreva, should resolve in the next 2 to 3 days.  Consider antiviral suppressive therapy.  Also advised that stress and cigarette smoking are known triggers.

## 2023-06-04 NOTE — Assessment & Plan Note (Signed)
Pregnancy test negative today, Depo administered.  Advised she follow-up with GYN for consideration of hysterectomy given her history of pelvic pain/endometriosis/PCOS.  Will likely need updated pelvic imaging

## 2023-06-04 NOTE — Patient Instructions (Signed)
Preventive Care 16-44 Years Old, Female  Preventive care refers to lifestyle choices and visits with your health care provider that can promote health and wellness. Preventive care visits are also called wellness exams.  What can I expect for my preventive care visit?  Counseling  Your health care provider may ask you questions about your:  Medical history, including:  Past medical problems.  Family medical history.  Pregnancy history.  Current health, including:  Menstrual cycle.  Method of birth control.  Emotional well-being.  Home life and relationship well-being.  Sexual activity and sexual health.  Lifestyle, including:  Alcohol, nicotine or tobacco, and drug use.  Access to firearms.  Diet, exercise, and sleep habits.  Work and work Astronomer.  Sunscreen use.  Safety issues such as seatbelt and bike helmet use.  Physical exam  Your health care provider will check your:  Height and weight. These may be used to calculate your BMI (body mass index). BMI is a measurement that tells if you are at a healthy weight.  Waist circumference. This measures the distance around your waistline. This measurement also tells if you are at a healthy weight and may help predict your risk of certain diseases, such as type 2 diabetes and high blood pressure.  Heart rate and blood pressure.  Body temperature.  Skin for abnormal spots.  What immunizations do I need?    Vaccines are usually given at various ages, according to a schedule. Your health care provider will recommend vaccines for you based on your age, medical history, and lifestyle or other factors, such as travel or where you work.  What tests do I need?  Screening  Your health care provider may recommend screening tests for certain conditions. This may include:  Lipid and cholesterol levels.  Diabetes screening. This is done by checking your blood sugar (glucose) after you have not eaten for a while (fasting).  Pelvic exam and Pap test.  Hepatitis B test.  Hepatitis C  test.  HIV (human immunodeficiency virus) test.  STI (sexually transmitted infection) testing, if you are at risk.  Lung cancer screening.  Colorectal cancer screening.  Mammogram. Talk with your health care provider about when you should start having regular mammograms. This may depend on whether you have a family history of breast cancer.  BRCA-related cancer screening. This may be done if you have a family history of breast, ovarian, tubal, or peritoneal cancers.  Bone density scan. This is done to screen for osteoporosis.  Talk with your health care provider about your test results, treatment options, and if necessary, the need for more tests.  Follow these instructions at home:  Eating and drinking    Eat a diet that includes fresh fruits and vegetables, whole grains, lean protein, and low-fat dairy products.  Take vitamin and mineral supplements as recommended by your health care provider.  Do not drink alcohol if:  Your health care provider tells you not to drink.  You are pregnant, may be pregnant, or are planning to become pregnant.  If you drink alcohol:  Limit how much you have to 0-1 drink a day.  Know how much alcohol is in your drink. In the [place] one drink equals one 12 oz bottle of beer (355 mL), one 5 oz glass of wine (148 mL), or one 1 oz glass of hard liquor (44 mL).  Lifestyle  Brush your teeth every morning and night with fluoride toothpaste. Floss one time each day.  Exercise for at least  30 minutes 5 or more days each week.  Do not use any products that contain nicotine or tobacco. These products include cigarettes, chewing tobacco, and vaping devices, such as e-cigarettes. If you need help quitting, ask your health care provider.  Do not use drugs.  If you are sexually active, practice safe sex. Use a condom or other form of protection to prevent STIs.  If you do not wish to become pregnant, use a form of birth control. If you plan to become pregnant, see your health care provider for a  prepregnancy visit.  Take aspirin only as told by your health care provider. Make sure that you understand how much to take and what form to take. Work with your health care provider to find out whether it is safe and beneficial for you to take aspirin daily.  Find healthy ways to manage stress, such as:  Meditation, yoga, or listening to music.  Journaling.  Talking to a trusted person.  Spending time with friends and family.  Minimize exposure to UV radiation to reduce your risk of skin cancer.  Safety  Always wear your seat belt while driving or riding in a vehicle.  Do not drive:  If you have been drinking alcohol. Do not ride with someone who has been drinking.  When you are tired or distracted.  While texting.  If you have been using any mind-altering substances or drugs.  Wear a helmet and other protective equipment during sports activities.  If you have firearms in your house, make sure you follow all gun safety procedures.  Seek help if you have been physically or sexually abused.  What's next?  Visit your health care provider once a year for an annual wellness visit.  Ask your health care provider how often you should have your eyes and teeth checked.  Stay up to date on all vaccines.  This information is not intended to replace advice given to you by your health care provider. Make sure you discuss any questions you have with your health care provider.  Document Revised: 10/05/2020 Document Reviewed: 10/05/2020  Elsevier Patient Education  2024 [employer].

## 2023-06-04 NOTE — Addendum Note (Signed)
Addended by: Remo Lipps on: 06/04/2023 03:31 PM   Modules accepted: Level of Service

## 2023-06-04 NOTE — Assessment & Plan Note (Signed)
Refill nystatin, exam deferred to GYN today.

## 2023-06-04 NOTE — Assessment & Plan Note (Signed)
Refill triamcinolone for as needed use

## 2023-06-04 NOTE — Progress Notes (Signed)
Date:  06/04/2023   Name:  Denise Ellis   DOB:  1980-12-22   MRN:  [id number]   Chief Complaint: Contraception (Needs depo /), Insomnia, and Depression  HPI Denise Ellis presents to the clinic today to discuss some of her chronic conditions.  Primarily, she needs negative pregnancy test that she may resume Depo (overdue).  Thankfully she has made an appointment with GYN for this afternoon to discuss endometriosis and PCOS, likely will need updated pelvic imaging.  Still struggling with sleep.  Currently using Seroquel 300 to 450 mg nightly, feels that if she takes any more than this she has bad palpitations.  Also continues with Xanax 1 mg 3 times daily, no behavioral health (denies the services).  Has also tried Benadryl, hydroxyzine, gabapentin for sleep.  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  Currently using high-dose valacyclovir on an episodic basis.  Has used Abreva before, states it does not work very well.   Medication list has been reviewed and updated.  Current Meds  Medication Sig   albuterol (PROVENTIL) (2.5 MG/3ML) 0.083% nebulizer solution 2.5 mg.   medroxyPROGESTERone (DEPO-PROVERA) 150 MG/ML injection Inject 1 mL (150 mg total) into the muscle every 3 (three) months.   QUEtiapine (SEROQUEL) 300 MG tablet Take 1.5-2 tablets (450-600 mg total) by mouth at bedtime.   Semaglutide-Weight Management (WEGOVY) 0.25 MG/0.5ML SOAJ Inject 0.25 mg into the skin once a week.   [DISCONTINUED] ALPRAZolam (XANAX) 1 MG tablet TAKE 1 TABLET(1 MG) BY MOUTH THREE TIMES DAILY AS NEEDED FOR ANXIETY   [DISCONTINUED] nystatin cream (MYCOSTATIN) Apply 1 Application topically 2 (two) times daily.   [DISCONTINUED] tirzepatide (ZEPBOUND) 2.5 MG/0.5ML Pen Inject 2.5 mg into the skin once a week.   [DISCONTINUED] triamcinolone cream (KENALOG) 0.1 % Apply topically 2 (two) times daily as needed. 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   Radial styloid tenosynovitis 01/28/2023   Spinal stenosis of lumbar region 01/28/2023   Sprain of metacarpophalangeal joint 01/28/2023   Chronic prescription benzodiazepine use 01/28/2023   Depo-Provera contraceptive status 09/05/2022   Symptomatic mammary hypertrophy 08/30/2022   Endometriosis 08/30/2022   History of cervical dysplasia 08/30/2022   Dyshidrotic eczema 07/16/2022   Chronic obstructive pulmonary disease (HCC) 07/31/2021   Nephrolithiasis 08/31/2020   Ureteric stone 05/11/2020   PTSD (post-traumatic stress disorder) 02/01/2017   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HCC) 11/14/2015   Chronic pain associated with significant psychosocial dysfunction 05/25/2014   Pain in limb 01/31/2014   Class 3 severe obesity with serious comorbidity and body mass index (BMI) of 40.0 to 44.9 in adult (HCC) 01/31/2014   Chronic leg pain    Chronic back pain    Hypovitaminosis D 08/10/2013   Insomnia 07/20/2013   Paroxysmal nocturnal dyspnea 07/20/2013   Generalized anxiety disorder with panic attacks 08/25/2012   Sciatica 08/25/2012   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   Esophageal reflux 08/25/2012   Suicidal ideation 08/25/2012   History of TIA (transient ischemic attack) 08/25/2012   Depressive disorder 06/13/2012   Degeneration of intervertebral disc of lumbar  region 06/13/2012   Hand joint pain 03/28/2009   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   Polycystic ovaries 10/06/2008    Allergies  Allergen Reactions   Ceftriaxone Other (See Comments)    Tightness in chest, throat   Gabapentin  Anaphylaxis   Naproxen Hives, Nausea And Vomiting and Other (See Comments)   Codeine Other (See Comments)   Ibuprofen Nausea And Vomiting and Hives   Methocarbamol Nausea And Vomiting   Other Other (See Comments)   Meloxicam Hives   Pregabalin     Other Reaction(s): made her pain worse   Gadolinium Derivatives Nausea And Vomiting    Immunization History  Administered Date(s) Administered   Tdap 05/28/2007    Past Surgical History:  Procedure Laterality Date   APPENDECTOMY      Social History   Tobacco Use   Smoking status: Some Days    Current packs/day: 0.50    Average packs/day: 0.5 packs/day for 18.0 years (9.0 ttl pk-yrs)    Types: Cigarettes   Smokeless tobacco: Never  Vaping Use   Vaping status: Never Used  Substance Use Topics   Alcohol use: No   Drug use: Not Currently    Family History  Problem Relation Age of Onset   Stroke Mother    Hypertension Mother    Ovarian cancer Mother    Diabetes Father    Heart disease Father    Diabetes Mellitus I Father    Heart disease Brother         05/13/2023   11:44 AM 01/28/2023    4:01 PM 12/26/2022    8:55 AM 08/29/2022    4:23 PM  GAD 7 : Generalized Anxiety Score  Nervous, Anxious, on Edge 3 3 3 3   Control/stop worrying 3 3 3 3   Worry too much - different things 3 2 3 3   Trouble relaxing 3 3 3 3   Restless 3 3 3 3   Easily annoyed or irritable 3 3 2 1   Afraid - awful might happen 3 3 2 2   Total GAD 7 Score 21 20 19 18   Anxiety Difficulty Very difficult Extremely difficult Very difficult Very difficult       06/04/2023   10:51 AM 06/04/2023   10:50 AM 05/13/2023   11:44 AM  Depression screen PHQ 2/9  Decreased Interest 3 3 3   Down, Depressed, Hopeless 3 3 3   PHQ - 2 Score 6 6 6   Altered sleeping 3  3  Tired, decreased energy 3  3  Change in appetite 3  3  Feeling bad or failure about yourself  3  3  Trouble concentrating 1  1  Moving slowly or fidgety/restless 0  0  Suicidal thoughts 0  0  PHQ-9  Score 19  19  Difficult doing work/chores Very difficult  Very difficult    BP Readings from Last 3 Encounters:  06/04/23 126/77  06/04/23 110/74  01/28/23 112/82    Wt Readings from Last 3 Encounters:  06/04/23 250 lb (113.4 kg)  06/04/23 250 lb (113.4 kg)  01/28/23 252 lb (114.3 kg)    BP 110/74   Pulse (!) 112   Temp 98.3 F (36.8 C)   Ht 5\' 6"  (1.676 m)   Wt 250 lb (113.4 kg)   SpO2 95%   BMI 40.35 kg/m   Physical Exam Vitals and nursing note reviewed.  Constitutional:      Appearance: Normal appearance.  HENT:     Mouth/Throat:   Cardiovascular:     Rate  and Rhythm: Normal rate.  Pulmonary:     Effort: Pulmonary effort is normal.  Abdominal:     General: There is no distension.  Musculoskeletal:        General: Normal range of motion.  Skin:    General: Skin is warm and dry.  Neurological:     Mental Status: She is alert and oriented to person, place, and time.     Gait: Gait is intact.  Psychiatric:        Mood and Affect: Mood and affect normal.     Recent Labs     Component Value Date/Time   NA 143 08/19/2020 0000   NA 145 07/17/2011 1902   K 4.8 08/19/2020 0000   K 3.7 07/17/2011 1902   CL 104 08/19/2020 0000   CL 109 (H) 07/17/2011 1902   CO2 17 08/19/2020 0000   CO2 25 07/17/2011 1902   GLUCOSE 78 10/01/2017 0208   GLUCOSE 92 07/17/2011 1902   BUN 10 08/19/2020 0000   BUN 5 (L) 07/17/2011 1902   CREATININE 0.8 08/19/2020 0000   CREATININE 0.60 10/01/2017 0208   CREATININE 0.69 07/17/2011 1902   CALCIUM 8.9 08/19/2020 0000   CALCIUM 8.8 07/17/2011 1902   PROT 7.3 10/01/2017 0119   PROT 7.3 07/17/2011 1902   ALBUMIN 4.2 08/19/2020 0000   ALBUMIN 3.6 07/17/2011 1902   AST 13 08/19/2020 0000   AST 18 07/17/2011 1902   ALT 10 08/19/2020 0000   ALT 22 07/17/2011 1902   ALKPHOS 101 08/19/2020 0000   ALKPHOS 82 07/17/2011 1902   BILITOT 0.4 10/01/2017 0119   BILITOT 0.2 07/17/2011 1902   GFRNONAA >60 10/01/2017 0119   GFRNONAA >60  07/17/2011 1902   GFRAA >60 10/01/2017 0119   GFRAA >60 07/17/2011 1902    Lab Results  Component Value Date   WBC 12.8 (H) 01/31/2017   HGB 14.6 10/01/2017   HCT 43.0 10/01/2017   MCV 89.9 01/31/2017   PLT 390 01/31/2017   No results found for: "HGBA1C" No results found for: "CHOL", "HDL", "LDLCALC", "LDLDIRECT", "TRIG", "CHOLHDL" Lab Results  Component Value Date   TSH 1.900 02/01/2014     Assessment and Plan:  Depo-Provera contraceptive status -     POCT urine pregnancy -     medroxyPROGESTERone Acetate  Recurrent oral herpes simplex  Intertrigo -     Nystatin; Apply 1 Application topically 2 (two) times daily.  Dispense: 30 g; Refill: 3  Dyshidrotic eczema -     Triamcinolone Acetonide; Apply topically 2 (two) times daily as needed. Apply to hands for dyshidrotic eczema  Dispense: 30 g; Refill: 5  Generalized anxiety disorder with panic attacks -     ALPRAZolam; Take 1 tablet (1 mg total) by mouth 3 (three) times daily as needed for anxiety.  Dispense: 90 tablet; Refill: 2  Class 3 severe obesity with serious comorbidity and body mass index (BMI) of 40.0 to 44.9 in adult, unspecified obesity type (HCC) -     ZOXWRU; Inject 0.25 mg into the skin once a week.  Dispense: 2 mL; Refill: 0     Return if symptoms worsen or fail to improve.    Alvester Morin, PA-C, DMSc, Nutritionist [facility] [phone]

## 2023-06-04 NOTE — Assessment & Plan Note (Signed)
Comorbidities include MDD, osteoarthritis.  Patient would like me to try prior Auth for [facility] again.

## 2023-06-05 ENCOUNTER — Ambulatory Visit: Payer: 59 | Admitting: Physician Assistant

## 2023-06-05 ENCOUNTER — Telehealth: Payer: Self-pay

## 2023-06-05 NOTE — Telephone Encounter (Signed)
PA completed waiting for insurance approval.  Key: [id number]  KP

## 2023-06-05 NOTE — Telephone Encounter (Signed)
PA completed  KP

## 2023-06-10 ENCOUNTER — Telehealth: Payer: Self-pay | Admitting: Physician Assistant

## 2023-06-10 ENCOUNTER — Encounter: Payer: Self-pay | Admitting: Physician Assistant

## 2023-06-10 NOTE — Telephone Encounter (Signed)
 Please review.  KP

## 2023-06-10 NOTE — Telephone Encounter (Signed)
 Copied from CRM [id number]. Topic: General - Other >> Jun 10, 2023 12:16 PM Dominique A wrote: Reason for CRM: Pt states that her PCP Alvester Morin referred her to a doctor in [place]. Her appt is for 06/13/2023 and their is a note on her chart stating to contact Dr. Loree Fee if anything is wrote. Pt states that was her pain management doctor that she had 3 years ago and she does not see him and want this taken off her chart before she goes to the doctor on 2/20. Please advise.

## 2023-06-11 NOTE — Telephone Encounter (Signed)
 Approved- Request Reference Number: [id number]. WEGOVY INJ 0.25MG  is approved through 04/22/2024. Your patient may now fill this prescription and it will be covered.. Authorization Expiration Date: April 22, 2024.  KP

## 2023-06-13 NOTE — Telephone Encounter (Signed)
 Please review.  KP

## 2023-06-17 NOTE — Telephone Encounter (Signed)
 FYI

## 2023-06-20 ENCOUNTER — Telehealth: Payer: Self-pay

## 2023-06-20 NOTE — Telephone Encounter (Signed)
 Copied from CRM [id number]. Topic: Clinical - Medical Advice >> Jun 19, 2023  4:31 PM Elle L wrote: Reason for CRM: The patient went to the pain clinic today and was advised that they are unable to prescribe her medication as she is currently prescribed ALPRAZolam (XANAX) 1 MG tablet and they state that they did not receive her medical records from the office. She states that she is having worsening pain and anxiety but declined nurse triage. She is requesting for PA Tillie Fantasia or his nurse to give her a call at [phone].

## 2023-06-20 NOTE — Telephone Encounter (Signed)
 See other message

## 2023-06-20 NOTE — Telephone Encounter (Signed)
 Please review.  KP

## 2023-06-21 DIAGNOSIS — J449 Chronic obstructive pulmonary disease, unspecified: Secondary | ICD-10-CM | POA: Diagnosis not present

## 2023-06-26 ENCOUNTER — Telehealth: Payer: Self-pay | Admitting: Obstetrics

## 2023-06-26 ENCOUNTER — Other Ambulatory Visit: Payer: 59

## 2023-06-26 NOTE — Telephone Encounter (Signed)
 Reached out to pt about gyn [place] that was scheduled on 06/26/2023 at 11:15 (per Dr. Lonny Prude).  Could not leave a message bc mailbox was full.

## 2023-06-27 ENCOUNTER — Encounter: Payer: Self-pay | Admitting: Obstetrics

## 2023-06-27 NOTE — Telephone Encounter (Signed)
 Reached out to pt about gyn [place] that was scheduled on 3/5/025 at 11:15 (per Roby).  Could not leave a message bc mailbox was full.  Will send a MyChart letter to pt.

## 2023-06-28 ENCOUNTER — Other Ambulatory Visit: Payer: Self-pay | Admitting: Physician Assistant

## 2023-06-28 DIAGNOSIS — F410 Panic disorder [episodic paroxysmal anxiety] without agoraphobia: Secondary | ICD-10-CM

## 2023-06-28 NOTE — Telephone Encounter (Signed)
 Copied from CRM [id number]. Topic: Clinical - Medication Refill >> Jun 28, 2023  2:45 PM Carlatta H wrote: Most Recent Primary Care Visit:  Provider: Remo Lipps  Department: [facility]  Visit Type: OFFICE VISIT  Date: 06/04/2023  Medication: QUEtiapine (SEROQUEL) 300 MG tablet [[id number]]  Has the patient contacted their pharmacy? No (Agent: If no, request that the patient contact the pharmacy for the refill. If patient does not wish to contact the pharmacy document the reason why and proceed with request.) (Agent: If yes, when and what did the pharmacy advise?)  Is this the correct pharmacy for this prescription? Yes If no, delete pharmacy and type the correct one.  This is the patient's preferred pharmacy:  [facility] - [place], [place] - [address] [place] [address] Phone: [phone] Fax: [phone]   Has the prescription been filled recently? Yes  Is the patient out of the medication? Yes  Has the patient been seen for an appointment in the last year OR does the patient have an upcoming appointment? Yes  Can we respond through MyChart? Yes  Agent: Please be advised that Rx refills may take up to 3 business days. We ask that you follow-up with your pharmacy.

## 2023-07-01 NOTE — Telephone Encounter (Signed)
 Requested medication (s) are due for refill today: no  Requested medication (s) are on the active medication list: yes  Last refill:  06/04/23 #90 2 RF  Future visit scheduled: yes  Notes to clinic:  pt wanting med switch to local pharmacy   Requested Prescriptions  Pending Prescriptions Disp Refills   ALPRAZolam (XANAX) 1 MG tablet [Pharmacy Med Name: ALPRAZOLAM 1MG  TABLETS] 90 tablet     Sig: TAKE 1 TABLET(1 MG) BY MOUTH THREE TIMES DAILY AS NEEDED FOR ANXIETY     Not Delegated - Psychiatry: Anxiolytics/Hypnotics 2 Failed - 07/01/2023 10:34 AM      Failed - This refill cannot be delegated      Failed - Urine Drug Screen completed in last 360 days      Passed - Patient is not pregnant      Passed - Valid encounter within last 6 months    Recent Outpatient Visits           1 month ago Chronic prescription benzodiazepine use   [facility] [facility] at [facility], [place], [place]   5 months ago Insomnia due to medical condition   [facility] at [facility], [place], [place]   6 months ago Generalized anxiety disorder with panic attacks   [facility] at [facility], [place], [place]   10 months ago Depo-Provera contraceptive status   [facility] at [facility], [place] Al[place], [place]   11 months ago Foot pain, left   [facility] at [facility], [place] Al[place], [place]       Future Appointments             In 2 months [name], [place] Alar, [place] [facility] at [facility], [facility]

## 2023-07-04 ENCOUNTER — Ambulatory Visit: Payer: Self-pay | Admitting: Physician Assistant

## 2023-07-04 NOTE — Telephone Encounter (Signed)
 I have not had a chance to respond to this patient's lengthy message she sent me at 7 a.m. this morning. There is nothing I can do for her regarding pain control.

## 2023-07-04 NOTE — Telephone Encounter (Signed)
 Please review.  KP

## 2023-07-04 NOTE — Telephone Encounter (Signed)
 Please review and advise.  Denise Ellis

## 2023-07-04 NOTE — Telephone Encounter (Signed)
  Chief Complaint: pain Symptoms: back and leg pain Frequency: chronic ongoing for over 15 years Pertinent Negatives: Patient denies sob, chest pain Disposition: [] ED /[] Urgent Care (no appt availability in office) / [] Appointment(In office/virtual)/ []  [place] [facility]/ [] Home Care/ [x] Refused Recommended Disposition /[] [place] [facility]/ []  Follow-up with PCP Additional Notes: Patient called stating that she is having back pain and leg pain what has been ongoing for years. She states that she was sent to Pain management but they refused to prescribe her any pain medication. Patient states that she is taking tylenol but it doesn't help for long. They pain is interrupting her sleep at night. Offered appt with Reuel Boom  for today.  But she didn't want to come in. She states the pain is a 8/10 an never goes away.   Copied from CRM [id number]. Topic: Clinical - Red Word Triage >> Jul 04, 2023  2:20 PM Clayton Bibles wrote: Red Word that prompted transfer to Nurse Triage: She in a lot of pain in back and legs. It is around a 8 or 9 on scale of 1-10. She does know what to do Reason for Disposition  Back pain is a chronic symptom (recurrent or ongoing AND present > 4 weeks)  Answer Assessment - Initial Assessment Questions 1. ONSET: "When did the pain begin?"      Pain over 15 years 2. LOCATION: "Where does it hurt?" (upper, mid or lower back)     Lower back  3. SEVERITY: "How bad is the pain?"  (e.g., Scale 1-10; mild, moderate, or severe)   - MILD (1-3): Doesn't interfere with normal activities.    - MODERATE (4-7): Interferes with normal activities or awakens from sleep.    - SEVERE (8-10): Excruciating pain, unable to do any normal activities.      severe 4. PATTERN: "Is the pain constant?" (e.g., yes, no; constant, intermittent)      Yes constant 5. RADIATION: "Does the pain shoot into your legs or somewhere else?"     Radiates to legs 6. CAUSE:  "What do you think is causing the back  pain?"      Injury years ago 7. BACK OVERUSE:  "Any recent lifting of heavy objects, strenuous work or exercise?"     no 8. MEDICINES: "What have you taken so far for the pain?" (e.g., nothing, acetaminophen, NSAIDS)     tylenol  Protocols used: Back Pain-A-AH

## 2023-07-05 NOTE — Telephone Encounter (Signed)
 Please review. JM

## 2023-07-22 DIAGNOSIS — J449 Chronic obstructive pulmonary disease, unspecified: Secondary | ICD-10-CM | POA: Diagnosis not present

## 2023-08-05 ENCOUNTER — Telehealth: Payer: Self-pay

## 2023-08-05 ENCOUNTER — Encounter: Payer: Self-pay | Admitting: Physician Assistant

## 2023-08-05 ENCOUNTER — Ambulatory Visit: Admitting: Physician Assistant

## 2023-08-05 NOTE — Telephone Encounter (Signed)
Called pt could not leave VM. VM full.  KP

## 2023-08-05 NOTE — Telephone Encounter (Signed)
-----   Message from Danelle Dunning sent at 08/05/2023  7:58 AM EDT ----- Regarding: Appt Today Please call Remy (on the schedule for 4p today) when you have a chance to ask if she only needs a referral. If so, she does not need an appointment. I do not have any personal recommendations for pain psychologists/psychiatrists, but I have previously sent her a link to the Psychology Today "Find a Therapist Tool." She can Google that and put in her filters to narrow the search. Once she finds where she would like to go, she can send a message with the name or practice, although she may not need a referral at all. Thanks!

## 2023-08-28 ENCOUNTER — Other Ambulatory Visit: Payer: Self-pay | Admitting: Physician Assistant

## 2023-08-28 DIAGNOSIS — F410 Panic disorder [episodic paroxysmal anxiety] without agoraphobia: Secondary | ICD-10-CM

## 2023-08-30 NOTE — Telephone Encounter (Signed)
 Copied from CRM [id number]. Topic: Clinical - Medication Refill >> Aug 30, 2023  2:47 PM Denise Ellis R wrote: Medication: ALPRAZolam  (XANAX ) 1 MG tablet  Is the patient out of the medication? Yes  Has the patient been seen for an appointment in the last year OR does the patient have an upcoming appointment? Yes  Patient calling back about this medication. Its pending and patient will be going out of town. Can we see if the doctor can review the request and approve the medication? Or does the patient need to be seen? Please contact patient.

## 2023-08-30 NOTE — Telephone Encounter (Signed)
 Requested medications are due for refill today.  yes  Requested medications are on the active medications list.  yes  Last refill. 06/04/2023 #90 2 rf  Future visit scheduled.   yes  Notes to clinic.  Refill not delegated.    Requested Prescriptions  Pending Prescriptions Disp Refills   ALPRAZolam  (XANAX ) 1 MG tablet [Pharmacy Med Name: ALPRAZOLAM  1 MG TABLET] 90 tablet 0    Sig: Take 1 tablet (1 mg total) by mouth 3 (three) times daily as needed for anxiety.     Not Delegated - Psychiatry: Anxiolytics/Hypnotics 2 Failed - 08/30/2023  1:21 PM      Failed - This refill cannot be delegated      Failed - Urine Drug Screen completed in last 360 days      Passed - Patient is not pregnant      Passed - Valid encounter within last 6 months    Recent Outpatient Visits           2 months ago Depo-Provera  contraceptive status   [facility] at [facility], Arleen Lacer, [place]       Future Appointments             In 3 days Larkin Plumb, Arleen Lacer, PA [facility] Primary Care & Sports Medicine at [facility], [facility]

## 2023-09-02 ENCOUNTER — Ambulatory Visit: Payer: 59 | Admitting: Physician Assistant

## 2023-09-02 NOTE — Telephone Encounter (Signed)
 Please review. Med refill

## 2023-09-06 ENCOUNTER — Other Ambulatory Visit: Payer: Self-pay | Admitting: Physician Assistant

## 2023-09-06 DIAGNOSIS — Z3042 Encounter for surveillance of injectable contraceptive: Secondary | ICD-10-CM

## 2023-09-06 NOTE — Telephone Encounter (Signed)
 Copied from CRM [id number]. Topic: Clinical - Medication Refill >> Sep 06, 2023 12:40 PM Rosaria Common wrote: Medication: medroxyPROGESTERone  (DEPO-PROVERA ) 150 MG/ML injection  Has the patient contacted their pharmacy? Yes (Agent: If no, request that the patient contact the pharmacy for the refill. If patient does not wish to contact the pharmacy document the reason why and proceed with request.) (Agent: If yes, when and what did the pharmacy advise?)  This is the patient's preferred pharmacy:  [facility] - [place], [place] - [address] [place] [address] Phone: [phone] Fax: [phone]  Is this the correct pharmacy for this prescription? Yes If no, delete pharmacy and type the correct one.   Has the prescription been filled recently? No  Is the patient out of the medication? Yes  Has the patient been seen for an appointment in the last year OR does the patient have an upcoming appointment? Yes  Can we respond through MyChart? Yes  Agent: Please be advised that Rx refills may take up to 3 business days. We ask that you follow-up with your pharmacy.

## 2023-09-09 NOTE — Telephone Encounter (Signed)
 Requested medications are due for refill today.  yes  Requested medications are on the active medications list.  yes  Last refill. 05/15/2023 1mL 1 rf  Future visit scheduled.   yes  Notes to clinic.  Medication not assigned to a protocol. Please review for refill.    Requested Prescriptions  Pending Prescriptions Disp Refills   medroxyPROGESTERone  (DEPO-PROVERA ) 150 MG/ML injection 1 mL 1    Sig: Inject 1 mL (150 mg total) into the muscle every 3 (three) months.     Off-Protocol Failed - 09/09/2023  4:18 PM      Failed - Medication not assigned to a protocol, review manually.      Passed - Valid encounter within last 12 months    Recent Outpatient Visits           3 months ago Depo-Provera  contraceptive status   [place] [facility] Medicine at [facility], Arleen Lacer, [place]       Future Appointments             In 2 days Larkin Plumb, Arleen Lacer, PA [facility] Primary Care & Sports Medicine at [facility], [place]

## 2023-09-10 MED ORDER — MEDROXYPROGESTERONE ACETATE 150 MG/ML IM SUSP: 150.0000 mg | INTRAMUSCULAR | 0 refills | Status: DC

## 2023-09-11 ENCOUNTER — Ambulatory Visit: Admitting: Physician Assistant

## 2023-09-12 ENCOUNTER — Other Ambulatory Visit: Payer: Self-pay | Admitting: Physician Assistant

## 2023-09-12 ENCOUNTER — Telehealth: Payer: Self-pay | Admitting: Physician Assistant

## 2023-09-12 ENCOUNTER — Ambulatory Visit (INDEPENDENT_AMBULATORY_CARE_PROVIDER_SITE_OTHER): Admitting: Physician Assistant

## 2023-09-12 ENCOUNTER — Encounter: Payer: Self-pay | Admitting: Physician Assistant

## 2023-09-12 VITALS — BP 114/78 | HR 88 | Temp 98.2°F | Ht 67.0 in | Wt 247.0 lb

## 2023-09-12 DIAGNOSIS — G4701 Insomnia due to medical condition: Secondary | ICD-10-CM

## 2023-09-12 DIAGNOSIS — F411 Generalized anxiety disorder: Secondary | ICD-10-CM

## 2023-09-12 DIAGNOSIS — F410 Panic disorder [episodic paroxysmal anxiety] without agoraphobia: Secondary | ICD-10-CM

## 2023-09-12 DIAGNOSIS — E66812 Obesity, class 2: Secondary | ICD-10-CM

## 2023-09-12 DIAGNOSIS — Z6838 Body mass index (BMI) 38.0-38.9, adult: Secondary | ICD-10-CM

## 2023-09-12 DIAGNOSIS — F4310 Post-traumatic stress disorder, unspecified: Secondary | ICD-10-CM

## 2023-09-12 DIAGNOSIS — Z3042 Encounter for surveillance of injectable contraceptive: Secondary | ICD-10-CM | POA: Diagnosis not present

## 2023-09-12 LAB — POCT PREGNANCY, URINE

## 2023-09-12 MED ORDER — QUETIAPINE FUMARATE 300 MG PO TABS: 450.0000 mg | ORAL_TABLET | Freq: Every day | ORAL | 0 refills | Status: DC

## 2023-09-12 MED ORDER — MEDROXYPROGESTERONE ACETATE 150 MG/ML IM SUSY
150.0000 mg | PREFILLED_SYRINGE | INTRAMUSCULAR | Status: AC
  Administered 2023-09-12: 150 mg via INTRAMUSCULAR

## 2023-09-12 MED ORDER — ALPRAZOLAM 1 MG PO TABS
1.0000 mg | ORAL_TABLET | Freq: Three times a day (TID) | ORAL | 0 refills | Status: AC | PRN
Start: 2023-09-12 — End: ?

## 2023-09-12 NOTE — Telephone Encounter (Signed)
 Please review.  KP

## 2023-09-12 NOTE — Telephone Encounter (Signed)
 Copied from CRM [id number]. Topic: Appointments - Scheduling Inquiry for Clinic >> Sep 12, 2023  9:32 AM Donald Frost wrote: Reason for CRM: The patient called stating she just got back in town and several of her medications were left where she was. She said she will talk with her provider today about QUEtiapine  (SEROQUEL ) 300 MG tablet, ALPRAZolam  (XANAX ) 1 MG tablet, and her inhaler, nebulizer, and her solution. She knows he will be frustrated but she just wanted to let him know.

## 2023-09-12 NOTE — Assessment & Plan Note (Signed)
 Pregnancy test negative today, Depo administered, encouraged to pursue pelvic ultrasound and follow through with GYN workup.

## 2023-09-12 NOTE — Telephone Encounter (Signed)
 Noted. Spoke with pharmacist. Last dispense was 09/02/23 despite PMP not showing this. Will discuss at OV today.

## 2023-09-12 NOTE — Assessment & Plan Note (Signed)
 Okay with early refill on alprazolam , as she has not requested an early refill within the last year.  PDMP reviewed.

## 2023-09-12 NOTE — Assessment & Plan Note (Signed)
 Okay with early refill on Seroquel 

## 2023-09-12 NOTE — Assessment & Plan Note (Signed)
 Pick up Wegovy , and make an appointment 1 month after starting this medication

## 2023-09-12 NOTE — Assessment & Plan Note (Signed)
 Discussed with patient that I think seeing a therapist for PTSD and chronic pain would be a great idea.  I showed her the "find a therapist" tool and wrote down the web address for her with several potential good options for therapists and their contact information.  She will let me know if the referral is needed, but probably not necessary.

## 2023-09-12 NOTE — Progress Notes (Signed)
 Date:  09/12/2023   Name:  Denise Ellis   DOB:  1980/09/03   MRN:  [id number]   Chief Complaint: Contraception and Medical Management of Chronic Issues  HPI Denise Ellis presents for routine follow up on chronic conditions.   Left several medications and nebulizer device at a hotel in [facility]. She called the hotel, and they could not find any of this.  She suspects they were thrown away. Last dispense for alprazolam  09/02/23 at [facility], I spoke with them earlier today to confirm. Patient requesting refill for this and seroquel , says [facility] should have it cheaper.   She joined an anonymous support group for PTSD, says several members have mentioned speaking with a "pain psychologist" and patient asks if I can refer her to one.   She never picked up Wegovy , which was approved by her insurance in February.  She is overdue for Depo which she uses for endometriosis.  GYN ordered pelvic ultrasound, which has not yet been completed.  Considering hysterectomy, patient on the fence about this.    Medication list has been reviewed and updated.  Current Meds  Medication Sig   albuterol  (PROVENTIL ) (2.5 MG/3ML) 0.083% nebulizer solution 2.5 mg.   medroxyPROGESTERone  (DEPO-PROVERA ) 150 MG/ML injection Inject 1 mL (150 mg total) into the muscle every 3 (three) months.   medroxyPROGESTERone  Acetate 150 MG/ML SUSY Inject into the muscle.   nystatin  cream (MYCOSTATIN ) Apply 1 Application topically 2 (two) times daily.   Semaglutide -Weight Management (WEGOVY ) 0.25 MG/0.5ML SOAJ Inject 0.25 mg into the skin once a week.   triamcinolone  cream (KENALOG ) 0.1 % Apply topically 2 (two) times daily as needed. Apply to hands for dyshidrotic eczema   [DISCONTINUED] ALPRAZolam  (XANAX ) 1 MG tablet Take 1 tablet (1 mg total) by mouth 3 (three) times daily as needed for anxiety.   [DISCONTINUED] QUEtiapine  (SEROQUEL ) 300 MG tablet Take 1.5-2 tablets (450-600 mg total) by mouth at bedtime.   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   Radial styloid tenosynovitis 01/28/2023   Spinal stenosis of lumbar region 01/28/2023   Sprain of metacarpophalangeal joint 01/28/2023   Chronic prescription benzodiazepine use 01/28/2023   Depo-Provera  contraceptive status 09/05/2022   Symptomatic mammary hypertrophy 08/30/2022   Endometriosis 08/30/2022   History of cervical dysplasia 08/30/2022   Dyshidrotic eczema 07/16/2022   Chronic obstructive pulmonary disease (HCC) 07/31/2021   Nephrolithiasis 08/31/2020   Ureteric stone 05/11/2020   PTSD (post-traumatic stress disorder) 02/01/2017   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HCC) 11/14/2015   Chronic pain associated with significant psychosocial dysfunction 05/25/2014   Pain in limb 01/31/2014   Class 2 severe obesity with serious comorbidity in adult (HCC) 01/31/2014   Chronic leg pain    Chronic back pain    Hypovitaminosis D 08/10/2013   Insomnia 07/20/2013   Paroxysmal nocturnal dyspnea 07/20/2013   Generalized anxiety disorder with panic attacks 08/25/2012   Sciatica 08/25/2012   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   Esophageal reflux 08/25/2012   Suicidal ideation 08/25/2012   History of TIA (transient ischemic attack) 08/25/2012   Depressive disorder 06/13/2012   Degeneration of intervertebral disc of lumbar region 06/13/2012  Hand joint pain 03/28/2009   Polycystic ovaries 10/06/2008    Allergies  Allergen Reactions   Ceftriaxone  Other (See Comments)    Tightness in chest, throat   Gabapentin Anaphylaxis   Naproxen  Hives, Nausea And Vomiting and Other (See Comments)   Codeine  Other  (See Comments)   Ibuprofen  Hives and Nausea And Vomiting    Other Reaction(s): rash, vomiting, unknown at this time   Methocarbamol  Nausea And Vomiting   Other Other (See Comments)   Meloxicam Hives   Pregabalin     Other Reaction(s): made her pain worse   Gadolinium Derivatives Nausea And Vomiting   Oxycodone -Acetaminophen      Other Reaction(s): unknown at this time    Immunization History  Administered Date(s) Administered   Tdap 05/28/2007    Past Surgical History:  Procedure Laterality Date   APPENDECTOMY      Social History   Tobacco Use   Smoking status: Some Days    Current packs/day: 0.50    Average packs/day: 0.5 packs/day for 18.0 years (9.0 ttl pk-yrs)    Types: Cigarettes   Smokeless tobacco: Never  Vaping Use   Vaping status: Never Used  Substance Use Topics   Alcohol  use: No   Drug use: Not Currently    Family History  Problem Relation Age of Onset   Stroke Mother    Hypertension Mother    Ovarian cancer Mother    Diabetes Father    Heart disease Father    Diabetes Mellitus I Father    Heart disease Brother         09/12/2023    2:37 PM 05/13/2023   11:44 AM 01/28/2023    4:01 PM 12/26/2022    8:55 AM  GAD 7 : Generalized Anxiety Score  Nervous, Anxious, on Edge 3 3 3 3   Control/stop worrying 3 3 3 3   Worry too much - different things 3 3 2 3   Trouble relaxing 3 3 3 3   Restless 3 3 3 3   Easily annoyed or irritable 3 3 3 2   Afraid - awful might happen 3 3 3 2   Total GAD 7 Score 21 21 20 19   Anxiety Difficulty Extremely difficult Very difficult Extremely difficult Very difficult       09/12/2023    2:37 PM 06/04/2023   10:51 AM 06/04/2023   10:50 AM  Depression screen PHQ 2/9  Decreased Interest 3 3 3   Down, Depressed, Hopeless 3 3 3   PHQ - 2 Score 6 6 6   Altered sleeping 3 3   Tired, decreased energy 3 3   Change in appetite 3 3   Feeling bad or failure about yourself  3 3   Trouble concentrating 3 1   Moving slowly or  fidgety/restless 3 0   Suicidal thoughts 2 0   PHQ-9 Score 26 19   Difficult doing work/chores Extremely dIfficult Very difficult     BP Readings from Last 3 Encounters:  09/12/23 114/78  06/04/23 126/77  06/04/23 110/74    Wt Readings from Last 3 Encounters:  09/12/23 247 lb (112 kg)  06/04/23 250 lb (113.4 kg)  06/04/23 250 lb (113.4 kg)    BP 114/78   Pulse 88   Temp 98.2 F (36.8 C)   Ht 5\' 7"  (1.702 m)   Wt 247 lb (112 kg)   SpO2 98%   BMI 38.69 kg/m   Physical Exam Vitals and nursing note reviewed.  Constitutional:      Appearance: Normal appearance.  Cardiovascular:     Rate and Rhythm: Normal rate.  Pulmonary:     Effort: Pulmonary effort is normal.  Abdominal:     General: There is no distension.  Musculoskeletal:        General: Normal range of motion.  Skin:    General: Skin is warm and dry.  Neurological:     Mental Status: She is alert and oriented to person, place, and time.     Gait: Gait is intact.  Psychiatric:        Mood and Affect: Mood and affect normal.     Recent Labs     Component Value Date/Time   NA 143 08/19/2020 0000   NA 145 07/17/2011 1902   K 4.8 08/19/2020 0000   K 3.7 07/17/2011 1902   CL 104 08/19/2020 0000   CL 109 (H) 07/17/2011 1902   CO2 17 08/19/2020 0000   CO2 25 07/17/2011 1902   GLUCOSE 78 10/01/2017 0208   GLUCOSE 92 07/17/2011 1902   BUN 10 08/19/2020 0000   BUN 5 (L) 07/17/2011 1902   CREATININE 0.8 08/19/2020 0000   CREATININE 0.60 10/01/2017 0208   CREATININE 0.69 07/17/2011 1902   CALCIUM 8.9 08/19/2020 0000   CALCIUM 8.8 07/17/2011 1902   PROT 7.3 10/01/2017 0119   PROT 7.3 07/17/2011 1902   ALBUMIN 4.2 08/19/2020 0000   ALBUMIN 3.6 07/17/2011 1902   AST 13 08/19/2020 0000   AST 18 07/17/2011 1902   ALT 10 08/19/2020 0000   ALT 22 07/17/2011 1902   ALKPHOS 101 08/19/2020 0000   ALKPHOS 82 07/17/2011 1902   BILITOT 0.4 10/01/2017 0119   BILITOT 0.2 07/17/2011 1902   GFRNONAA >60  10/01/2017 0119   GFRNONAA >60 07/17/2011 1902   GFRAA >60 10/01/2017 0119   GFRAA >60 07/17/2011 1902    Lab Results  Component Value Date   WBC 12.8 (H) 01/31/2017   HGB 14.6 10/01/2017   HCT 43.0 10/01/2017   MCV 89.9 01/31/2017   PLT 390 01/31/2017   No results found for: "HGBA1C" No results found for: "CHOL", "HDL", "LDLCALC", "LDLDIRECT", "TRIG", "CHOLHDL" Lab Results  Component Value Date   TSH 1.900 02/01/2014     Assessment and Plan:  Generalized anxiety disorder with panic attacks Assessment & Plan: Okay with early refill on alprazolam , as she has not requested an early refill within the last year.  PDMP reviewed.  Orders: -     ALPRAZolam ; Take 1 tablet (1 mg total) by mouth 3 (three) times daily as needed for anxiety.  Dispense: 90 tablet; Refill: 0  Insomnia due to medical condition Assessment & Plan: Okay with early refill on Seroquel   Orders: -     QUEtiapine  Fumarate; Take 1.5-2 tablets (450-600 mg total) by mouth at bedtime.  Dispense: 180 tablet; Refill: 0  PTSD (post-traumatic stress disorder) Assessment & Plan: Discussed with patient that I think seeing a therapist for PTSD and chronic pain would be a great idea.  I showed her the "find a therapist" tool and wrote down the web address for her with several potential good options for therapists and their contact information.  She will let me know if the referral is needed, but probably not necessary.   Depo-Provera  contraceptive status Assessment & Plan: Pregnancy test negative today, Depo administered, encouraged to pursue pelvic ultrasound and follow through with GYN workup.  Orders: -     POCT Pregnancy, Urine -     medroxyPROGESTERone  Acetate  Class 2 severe  obesity with serious comorbidity and body mass index (BMI) of 38.0 to 38.9 in adult, unspecified obesity type [facility]) Assessment & Plan: Pick up Wegovy , and make an appointment 1 month after starting this medication      F/u  TBD   Cody Das, PA-C, DMSc, Nutritionist [facility] Primary Care and Sports Medicine [facility] [phone]

## 2023-09-17 ENCOUNTER — Telehealth: Payer: Self-pay | Admitting: Physician Assistant

## 2023-09-17 DIAGNOSIS — R1030 Lower abdominal pain, unspecified: Secondary | ICD-10-CM | POA: Diagnosis not present

## 2023-09-17 DIAGNOSIS — Z79899 Other long term (current) drug therapy: Secondary | ICD-10-CM | POA: Diagnosis not present

## 2023-09-17 DIAGNOSIS — F17210 Nicotine dependence, cigarettes, uncomplicated: Secondary | ICD-10-CM | POA: Diagnosis not present

## 2023-09-17 DIAGNOSIS — J449 Chronic obstructive pulmonary disease, unspecified: Secondary | ICD-10-CM | POA: Diagnosis not present

## 2023-09-17 DIAGNOSIS — Z886 Allergy status to analgesic agent status: Secondary | ICD-10-CM | POA: Diagnosis not present

## 2023-09-17 DIAGNOSIS — M797 Fibromyalgia: Secondary | ICD-10-CM | POA: Diagnosis not present

## 2023-09-17 DIAGNOSIS — Z881 Allergy status to other antibiotic agents status: Secondary | ICD-10-CM | POA: Diagnosis not present

## 2023-09-17 NOTE — Telephone Encounter (Signed)
 Copied from CRM [id number]. Topic: General - Other >> Sep 17, 2023  9:14 AM Turkey B wrote: Reason for CRM: pt called, asking about getting container sent to her, to put used needles in, without throwing them directly in the trash

## 2023-09-17 NOTE — Telephone Encounter (Signed)
 Called pt left VM to call back. Pt can purchase sharps container online ([employer]).  KP

## 2023-09-18 ENCOUNTER — Telehealth: Payer: Self-pay | Admitting: Physician Assistant

## 2023-09-18 NOTE — Telephone Encounter (Signed)
 The patient called back stating she thought this is something her insurance would cover if she could just get a script. She said it would be way cheaper than having to purchase on [employer].

## 2023-09-18 NOTE — Telephone Encounter (Signed)
 Copied from CRM [id number]. Topic: Appointments - Scheduling Inquiry for Clinic >> Sep 18, 2023  4:24 PM Donald Frost wrote: Reason for CRM: The patient called stating she went to [facility] on 09/17/23 due to abdominal pain. She was discharged same day stating just to keep an eye on things. She says being this is a cyst on her ovaries and an endometriosis flare that this would be something more her ob/gyn would check on and she is going to call them. She is wondering if she needs an appt with her primary as well or just with the ob? Please assist patient further.

## 2023-09-18 NOTE — Telephone Encounter (Signed)
 Called pt left VM to call back. Pt needs to see GYN.  KP

## 2023-09-18 NOTE — Telephone Encounter (Signed)
 We do not send in sharp containers. Pt can ask her insurance where she can get one from.  KP

## 2023-10-02 ENCOUNTER — Other Ambulatory Visit: Payer: Self-pay | Admitting: Physician Assistant

## 2023-10-02 DIAGNOSIS — F410 Panic disorder [episodic paroxysmal anxiety] without agoraphobia: Secondary | ICD-10-CM

## 2023-10-03 NOTE — Telephone Encounter (Signed)
 Please review.  KP

## 2023-10-03 NOTE — Telephone Encounter (Signed)
 She could use a thick plastic jug like a detergent bottle or similar.

## 2023-10-03 NOTE — Telephone Encounter (Signed)
 Requested medications are due for refill today.  Too soon  Requested medications are on the active medications list.  yes  Last refill. 09/12/2023 #90 0 rf  Future visit scheduled.   no  Notes to clinic.  Refill/refusal not delegated.    Requested Prescriptions  Pending Prescriptions Disp Refills   ALPRAZolam  (XANAX ) 1 MG tablet [Pharmacy Med Name: ALPRAZOLAM  1 MG TABLET] 90 tablet 0    Sig: Take 1 tablet (1 mg total) by mouth 3 (three) times daily as needed for anxiety.     Not Delegated - Psychiatry: Anxiolytics/Hypnotics 2 Failed - 10/03/2023  2:00 PM      Failed - This refill cannot be delegated      Failed - Urine Drug Screen completed in last 360 days      Passed - Patient is not pregnant      Passed - Valid encounter within last 6 months    Recent Outpatient Visits           3 weeks ago Generalized anxiety disorder with panic attacks   [facility] Primary Care & [facility] Medicine at [facility], [place], [place]   4 months ago Depo-Provera  contraceptive status   [facility] Primary Care & Sports Medicine at [facility], Arleen Lacer, [place]

## 2023-10-04 ENCOUNTER — Other Ambulatory Visit: Payer: Self-pay | Admitting: Physician Assistant

## 2023-10-04 DIAGNOSIS — F410 Panic disorder [episodic paroxysmal anxiety] without agoraphobia: Secondary | ICD-10-CM

## 2023-10-04 DIAGNOSIS — J449 Chronic obstructive pulmonary disease, unspecified: Secondary | ICD-10-CM

## 2023-10-04 MED ORDER — FULL KIT NEBULIZER SET MISC: 1.0000 | 0 refills | Status: AC | PRN

## 2023-10-04 NOTE — Telephone Encounter (Signed)
 Spoke with patient via phone. It's for her husband Tawny Fate who is also my patient. I printed an rx for generic sharps container under his name for reimbursement but also told her she can just use a thick plastic bottle as described in previous message.   Separately, I also printed a prescription for a nebulizer set since she needs a replacement, advised to go to medical supply store.

## 2023-10-04 NOTE — Telephone Encounter (Signed)
 Called pt left VM to call back.  KP

## 2023-10-04 NOTE — Telephone Encounter (Signed)
 Pt states she wants PCP to write a prescription for a sharp disposal container to obtain them through her insurance so she can also get her money back since she is already paying all that money for insurance.  She has already purchased one container and wanted a reimbursement and the insurance company told her that she needs a prescription from her MD for the reimbursement.

## 2023-10-07 NOTE — Telephone Encounter (Signed)
 Requested medications are due for refill today.  yes  Requested medications are on the active medications list.  yes  Last refill. 09/12/2023 #90 0 rf  Future visit scheduled.   no  Notes to clinic.  Refill not delegated.    Requested Prescriptions  Pending Prescriptions Disp Refills   ALPRAZolam  (XANAX ) 1 MG tablet [Pharmacy Med Name: ALPRAZOLAM  1 MG TABLET] 90 tablet 0    Sig: Take 1 tablet (1 mg total) by mouth 3 (three) times daily as needed for anxiety.     Not Delegated - Psychiatry: Anxiolytics/Hypnotics 2 Failed - 10/07/2023  5:01 PM      Failed - This refill cannot be delegated      Failed - Urine Drug Screen completed in last 360 days      Passed - Patient is not pregnant      Passed - Valid encounter within last 6 months    Recent Outpatient Visits           3 weeks ago Generalized anxiety disorder with panic attacks   [facility] Primary Care & Sports Medicine at [facility], [place]leen [place], [place]   4 months ago Depo-Provera  contraceptive status   [facility] Primary Care & Sports Medicine at [facility], Arleen Lacer, [place]

## 2023-10-08 NOTE — Telephone Encounter (Signed)
 Please review.  KP

## 2023-10-10 ENCOUNTER — Telehealth: Payer: Self-pay

## 2023-10-10 DIAGNOSIS — G8929 Other chronic pain: Secondary | ICD-10-CM | POA: Diagnosis not present

## 2023-10-10 DIAGNOSIS — D3502 Benign neoplasm of left adrenal gland: Secondary | ICD-10-CM | POA: Diagnosis not present

## 2023-10-10 DIAGNOSIS — R1030 Lower abdominal pain, unspecified: Secondary | ICD-10-CM | POA: Diagnosis not present

## 2023-10-10 DIAGNOSIS — K5900 Constipation, unspecified: Secondary | ICD-10-CM | POA: Diagnosis not present

## 2023-10-10 DIAGNOSIS — M797 Fibromyalgia: Secondary | ICD-10-CM | POA: Diagnosis not present

## 2023-10-10 DIAGNOSIS — K6389 Other specified diseases of intestine: Secondary | ICD-10-CM | POA: Diagnosis not present

## 2023-10-10 DIAGNOSIS — F17210 Nicotine dependence, cigarettes, uncomplicated: Secondary | ICD-10-CM | POA: Diagnosis not present

## 2023-10-10 DIAGNOSIS — J449 Chronic obstructive pulmonary disease, unspecified: Secondary | ICD-10-CM | POA: Diagnosis not present

## 2023-10-10 NOTE — Telephone Encounter (Signed)
 Copied from CRM [id number]. Topic: Clinical - Medical Advice >> Oct 10, 2023 11:32 AM [name] wrote: Reason for CRM: PT called in Move forward with getting pregnant, said she sat and thought about it and has considered her options but would like to move forward. Pt would like to know whether next steps are and would like to speak to someone   Pt wants to make aware that she has endometriosis    Pt call back [phone]

## 2023-10-10 NOTE — Telephone Encounter (Signed)
 Attempted call to patient, went straight to voicemail, which is full.  Unable to LVM

## 2023-10-11 DIAGNOSIS — D3502 Benign neoplasm of left adrenal gland: Secondary | ICD-10-CM | POA: Diagnosis not present

## 2023-10-11 DIAGNOSIS — K6389 Other specified diseases of intestine: Secondary | ICD-10-CM | POA: Diagnosis not present

## 2023-10-11 DIAGNOSIS — K5900 Constipation, unspecified: Secondary | ICD-10-CM | POA: Diagnosis not present

## 2023-10-11 NOTE — Telephone Encounter (Signed)
 Second attempt. Straight to VM, full.

## 2023-10-14 ENCOUNTER — Telehealth: Payer: Self-pay

## 2023-10-14 NOTE — Telephone Encounter (Signed)
 Pt called after hours. Pt was told to go to the ER. Called pt to tell her to go to the ER. Could not leave VM. VM full. Mychart is not set up.  KP

## 2023-10-14 NOTE — Telephone Encounter (Signed)
 Finally able to reach patient on 3rd attempt today, reminded to clear her voicemail.   Discussed with her that while I am not directly involved in any fertility management, I did want to offer my professional advice. 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

## 2023-10-16 ENCOUNTER — Telehealth: Payer: Self-pay | Admitting: Physician Assistant

## 2023-10-16 NOTE — Telephone Encounter (Signed)
 Pt is aware that the orders have been sitting a the front desk for pick up.  KP

## 2023-10-16 NOTE — Telephone Encounter (Signed)
 Copied from CRM [id number]. Topic: Higher education careers adviser Patient (Clinic Use ONLY) >> Oct 16, 2023 10:42 AM Jon RAMAN wrote: Reason for CRM: Called pt to see if her and her husband still needed prescription that is sitting up front for FULL KIT NEBULIZER SET. Prescription has been up front since 10/04/2023.

## 2023-10-24 ENCOUNTER — Other Ambulatory Visit: Payer: Self-pay | Admitting: Physician Assistant

## 2023-10-24 DIAGNOSIS — F410 Panic disorder [episodic paroxysmal anxiety] without agoraphobia: Secondary | ICD-10-CM

## 2023-10-28 NOTE — Telephone Encounter (Signed)
 Requested medication (s) are due for refill today: yes   Requested medication (s) are on the active medication list: yes   Last refill:  10/08/23 # 90  Future visit scheduled: no  Notes to clinic:  Please review for refill. Refill not delegated per protocol.     Requested Prescriptions  Pending Prescriptions Disp Refills   ALPRAZolam  (XANAX ) 1 MG tablet [Pharmacy Med Name: ALPRAZOLAM  1 MG TABLET] 90 tablet 0    Sig: Take 1 tablet (1 mg total) by mouth 3 (three) times daily as needed for anxiety.     Not Delegated - Psychiatry: Anxiolytics/Hypnotics 2 Failed - 10/28/2023  3:38 PM      Failed - This refill cannot be delegated      Failed - Urine Drug Screen completed in last 360 days      Passed - Patient is not pregnant      Passed - Valid encounter within last 6 months    Recent Outpatient Visits           1 month ago Generalized anxiety disorder with panic attacks   [facility] at [facility], Tor[place], [place]   4 months ago Depo-Provera  contraceptive status   [facility] Medicine at [facility], Toribio SQUIBB, [place]

## 2023-10-29 ENCOUNTER — Other Ambulatory Visit: Payer: Self-pay | Admitting: Physician Assistant

## 2023-10-29 DIAGNOSIS — Z3042 Encounter for surveillance of injectable contraceptive: Secondary | ICD-10-CM

## 2023-10-29 NOTE — Telephone Encounter (Signed)
 Please review.  KP

## 2023-10-29 NOTE — Telephone Encounter (Signed)
 Copied from CRM [id number]. Topic: Clinical - Medication Refill >> Oct 29, 2023  9:52 AM Aleatha C wrote: Medication: medroxyPROGESTERone  (DEPO-PROVERA ) 150 MG/ML injection and ALPRAZolam  (XANAX ) 1 MG tablet  Has the patient contacted their pharmacy? No (Agent: If no, request that the patient contact the pharmacy for the refill. If patient does not wish to contact the pharmacy document the reason why and proceed with request.) (Agent: If yes, when and what did the pharmacy advise?)  This is the patient's preferred pharmacy:  [facility] - [place], [place] - [address] [place] [address] Phone: [phone] Fax: [phone]   Is this the correct pharmacy for this prescription? Yes If no, delete pharmacy and type the correct one.   Has the prescription been filled recently? No  Is the patient out of the medication? No  Has the patient been seen for an appointment in the last year OR does the patient have an upcoming appointment? Yes  Can we respond through MyChart? No  Agent: Please be advised that Rx refills may take up to 3 business days. We ask that you follow-up with your pharmacy.

## 2023-10-31 NOTE — Telephone Encounter (Signed)
 Xanax  can not be filled until 7/14. Too soon to fill depo.  KP

## 2023-10-31 NOTE — Telephone Encounter (Signed)
 Requested medication (s) are due for refill today: yes  Requested medication (s) are on the active medication list: yes  Last refill:  09/10/23   Future visit scheduled: yes  Notes to clinic:  Unable to refill per protocol, medication not assigned to the refill protocol.      Requested Prescriptions  Pending Prescriptions Disp Refills   medroxyPROGESTERone  (DEPO-PROVERA ) 150 MG/ML injection 1 mL 0    Sig: Inject 1 mL (150 mg total) into the muscle every 3 (three) months.     Off-Protocol Failed - 10/31/2023  1:00 PM      Failed - Medication not assigned to a protocol, review manually.      Passed - Valid encounter within last 12 months    Recent Outpatient Visits           1 month ago Generalized anxiety disorder with panic attacks   [facility] at [facility], Toribio SQUIBB, [place]   4 months ago Depo-Provera  contraceptive status   [facility] Primary Care & [facility] Medicine at [facility], Toribio SQUIBB, [place]

## 2023-11-12 NOTE — Progress Notes (Signed)
 [facility] Quality Team Note  Name: Denise Ellis Date of Birth: 09/25/1980 MRN: [id number] Date: 11/12/2023  [facility] Quality Team has reviewed this patient's chart, please see recommendations below:  [facility] Quality Other; (Chart reviewed for bcs measure. Mammogram has been ordered but not completed. During upcoming office visit on 12/13/2023 could scheduling mammogram be addressed?)

## 2023-12-13 ENCOUNTER — Ambulatory Visit: Admitting: Physician Assistant

## 2024-01-02 ENCOUNTER — Ambulatory Visit: Payer: Self-pay

## 2024-01-02 NOTE — Telephone Encounter (Signed)
 FYI Only or Action Required?: FYI only for provider.  Patient was last seen in primary care on 09/12/2023 by Denise Toribio SQUIBB, [place].  Called Nurse Triage reporting Panic Attack.  Symptoms began several weeks ago.  Interventions attempted: Prescription medications: xanax .  Symptoms are: unchanged.  Triage Disposition: See Physician Within 24 Hours  Patient/caregiver understands and will follow disposition?: Yes   Copied from CRM #[id number]. Topic: Clinical - Red Word Triage >> Jan 02, 2024  1:09 PM Tysheama G wrote: Red Word that prompted transfer to Nurse Triage: Patient stated she's having panic attacks, her anxiety has been really bad and she is unable to sleep. Reason for Disposition  Patient sounds very upset or troubled to the triager  Answer Assessment - Initial Assessment Questions 1. CONCERN: Did anything happen that prompted you to call today?      States has anxiety, panic attacks, can't sleep, one year anniversary to her mother's death 2. ANXIETY SYMPTOMS: Can you describe how you (your loved one; patient) have been feeling? (e.g., tense, restless, panicky, anxious, keyed up, overwhelmed, sense of impending doom).      Can't sleep, anxious 3. ONSET: How long have you been feeling this way? (e.g., hours, days, weeks)     Past two weeks 4. SEVERITY: How would you rate the level of anxiety? (e.g., 0 - 10; or mild, moderate, severe).     severe 5. FUNCTIONAL IMPAIRMENT: How have these feelings affected your ability to do daily activities? Have you had more difficulty than usual doing your normal daily activities? (e.g., getting better, same, worse; self-care, school, work, interactions)     Yes, getting worse 6. HISTORY: Have you felt this way before? Have you ever been diagnosed with an anxiety problem in the past? (e.g., generalized anxiety disorder, panic attacks, PTSD). If Yes, ask: How was this problem treated? (e.g., medicines, counseling, etc.)      yes 7. RISK OF HARM - SUICIDAL IDEATION: Do you ever have thoughts of hurting or killing yourself? If Yes, ask:  Do you have these feelings now? Do you have a plan on how you would do this?     denies 8. TREATMENT:  What has been done so far to treat this anxiety? (e.g., medicines, relaxation strategies). What has helped?     Rx meds 9. THERAPIST: Do you have a counselor or therapist? If Yes, ask: What is their name?     yes 10. POTENTIAL TRIGGERS: Do you drink caffeinated beverages (e.g., coffee, colas, teas), and how much daily? Do you drink alcohol  or use any drugs? Have you started any new medicines recently?       Mother's anniversary of her death 81. PATIENT SUPPORT: Who is with you now? Who do you live with? Do you have family or friends who you can talk to?        dad 106. OTHER SYMPTOMS: Do you have any other symptoms? (e.g., feeling depressed, trouble concentrating, trouble sleeping, trouble breathing, palpitations or fast heartbeat, chest pain, sweating, nausea, or diarrhea)       Everything is falling ontop of me , just having constant panic attacks. 13. PREGNANCY: Is there any chance you are pregnant? When was your last menstrual period?       na  Protocols used: Anxiety and Panic Attack-A-AH

## 2024-01-03 ENCOUNTER — Ambulatory Visit: Admitting: Physician Assistant

## 2024-01-03 ENCOUNTER — Encounter: Payer: Self-pay | Admitting: Physician Assistant

## 2024-01-03 ENCOUNTER — Telehealth: Admitting: Physician Assistant

## 2024-01-03 DIAGNOSIS — G8929 Other chronic pain: Secondary | ICD-10-CM | POA: Diagnosis not present

## 2024-01-03 DIAGNOSIS — M5442 Lumbago with sciatica, left side: Secondary | ICD-10-CM | POA: Diagnosis not present

## 2024-01-03 DIAGNOSIS — F410 Panic disorder [episodic paroxysmal anxiety] without agoraphobia: Secondary | ICD-10-CM | POA: Diagnosis not present

## 2024-01-03 DIAGNOSIS — M5441 Lumbago with sciatica, right side: Secondary | ICD-10-CM | POA: Diagnosis not present

## 2024-01-03 DIAGNOSIS — F411 Generalized anxiety disorder: Secondary | ICD-10-CM

## 2024-01-03 MED ORDER — HYDROXYZINE HCL 25 MG PO TABS: 25.0000 mg | ORAL_TABLET | Freq: Three times a day (TID) | ORAL | 1 refills | Status: AC | PRN

## 2024-01-03 MED ORDER — IBUPROFEN 800 MG PO TABS: 800.0000 mg | ORAL_TABLET | Freq: Three times a day (TID) | ORAL | 1 refills | Status: AC | PRN

## 2024-01-03 NOTE — Progress Notes (Signed)
 Date:  01/03/2024   Name:  Denise Ellis   DOB:  10-30-1980   MRN:  [id number]   I connected with Powell Garland on 01/03/2024 via MyChart Video and verified that I am speaking with the correct person using appropriate identifiers. The limitations, risks, security and privacy concerns of performing an evaluation and management service by MyChart Video, including the higher likelihood of inaccurate diagnoses and treatments, and the availability of in person appointments were reviewed. The possible need of an additional face-to-face encounter for complete and high quality delivery of care was discussed. The patient was also made aware that there may be a patient responsible charge related to this service. The patient expressed understanding and wishes to proceed.   Provider location is in medical facility [facility] Primary Care and Sports Medicine at [facility]). Patient location is at their home People involved in care of the patient during this telehealth encounter were myself, my CMA, and my front office/scheduling team member.    Though we attempted to connect with video capability, due to technical difficulties we were ultimately unsuccessful, and this visit was conducted via phone instead.  Chief Complaint: Panic Attack  HPI  Denise Ellis presents today virtually for evaluation of recently worsened anxiety, states 29-Jan-2023 was the death of her mother, so she has been struggling with the anniversary of this.  Increased frequency of panic attacks, worse trouble sleeping.  She already takes Xanax  1 mg 3 times per day and we have previously discussed that I would not like to increase the dose of this medication.  She takes Seroquel  450 mg per night, says if she takes 600 mg nightly she feels too drowsy.  She has previously tried and failed many medications for anxiety and depression, including a number of SSRIs/SNRIs, buspirone, clonazepam , Vraylar .  She is asking if there is anything  else that I can think of to help with her recently worsened anxiety.  Thankfully she has joined a support group for PTSD, is attending this with some regularity, sounds like participants are encouraged but not pressured to speak.  She feels that she may be deriving some benefit from this.  She would also like me to refill ibuprofen  800 mg, which she feels is very helpful for her sleep due to her chronic back pain.  Recent renal function normal.   Medication list has been reviewed and updated.  Current Meds  Medication Sig   albuterol  (PROVENTIL ) (2.5 MG/3ML) 0.083% nebulizer solution 2.5 mg.   ALPRAZolam  (XANAX ) 1 MG tablet Take 1 tablet (1 mg total) by mouth 3 (three) times daily as needed for anxiety.   hydrOXYzine  (ATARAX ) 25 MG tablet Take 1 tablet (25 mg total) by mouth 3 (three) times daily as needed.   ibuprofen  (ADVIL ) 800 MG tablet Take 1 tablet (800 mg total) by mouth every 8 (eight) hours as needed for moderate pain (pain score 4-6).   medroxyPROGESTERone  (DEPO-PROVERA ) 150 MG/ML injection Inject 1 mL (150 mg total) into the muscle every 3 (three) months.   medroxyPROGESTERone  Acetate 150 MG/ML SUSY Inject into the muscle.   nystatin  cream (MYCOSTATIN ) Apply 1 Application topically 2 (two) times daily.   QUEtiapine  (SEROQUEL ) 300 MG tablet Take 1.5-2 tablets (450-600 mg total) by mouth at bedtime.   Respiratory Therapy Supplies (FULL KIT NEBULIZER SET) MISC 1 each by Does not apply route as needed.   Semaglutide -Weight Management (WEGOVY ) 0.25 MG/0.5ML SOAJ Inject 0.25 mg into the skin once a week.   triamcinolone  cream (  KENALOG ) 0.1 % Apply topically 2 (two) times daily as needed. Apply to hands for dyshidrotic eczema   Current Facility-Administered Medications for the 01/03/24 encounter (Telemedicine) with Manya Toribio SQUIBB, PA  Medication   medroxyPROGESTERone  Acetate SUSY 150 mg     Review of Systems  Patient Active Problem List   Diagnosis Date Noted   Recurrent oral  herpes simplex 06/04/2023   Intertrigo 06/04/2023   Boil, breast 06/04/2023   Fibromyositis 01/28/2023   Radial styloid tenosynovitis 01/28/2023   Spinal stenosis of lumbar region 01/28/2023   Sprain of metacarpophalangeal joint 01/28/2023   Chronic prescription benzodiazepine use 01/28/2023   Depo-Provera  contraceptive status 09/05/2022   Symptomatic mammary hypertrophy 08/30/2022   Endometriosis 08/30/2022   History of cervical dysplasia 08/30/2022   Dyshidrotic eczema 07/16/2022   Chronic obstructive pulmonary disease (HCC) 07/31/2021   Nephrolithiasis 08/31/2020   Ureteric stone 05/11/2020   PTSD (post-traumatic stress disorder) 02/01/2017   Hostility 02/15/2016   Drug-seeking behavior 02/15/2016   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HCC) 11/14/2015   Chronic pain associated with significant psychosocial dysfunction 05/25/2014   Pain in limb 01/31/2014   Class 2 severe obesity with serious comorbidity in adult (HCC) 01/31/2014   Chronic leg pain    Chronic back pain    Hypovitaminosis D 08/10/2013   Insomnia 07/20/2013   Paroxysmal nocturnal dyspnea 07/20/2013   Generalized anxiety disorder with panic attacks 08/25/2012   Sciatica 08/25/2012   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   Esophageal reflux 08/25/2012   Suicidal ideation 08/25/2012   History of TIA (transient ischemic attack) 08/25/2012   Depressive disorder 06/13/2012   Degeneration of intervertebral disc of lumbar region 06/13/2012   Hand joint pain 03/28/2009   Anemia, B12 deficiency 03/26/2009   Polycystic ovaries 10/06/2008    Allergies  Allergen Reactions   Ceftriaxone  Other (See Comments)    Tightness in chest, throat   Gabapentin Anaphylaxis and Other (See Comments)   Naproxen  Hives, Nausea And Vomiting and Other (See Comments)   Codeine  Other (See Comments)   Ibuprofen  Hives and Nausea And Vomiting    Other Reaction(s): rash, vomiting, unknown at this time   Methocarbamol   Nausea And Vomiting   Other Other (See Comments)   Meloxicam Hives and Other (See Comments)   Pregabalin     Other Reaction(s): made her pain worse   Gadolinium Derivatives Nausea And Vomiting   Oxycodone -Acetaminophen      Other Reaction(s): unknown at this time    Immunization History  Administered Date(s) Administered   Tdap 05/28/2007    Past Surgical History:  Procedure Laterality Date   APPENDECTOMY      Social History   Tobacco Use   Smoking status: Some Days    Current packs/day: 0.50    Average packs/day: 0.5 packs/day for 18.0 years (9.0 ttl pk-yrs)    Types: Cigarettes   Smokeless tobacco: Never  Vaping Use   Vaping status: Never Used  Substance Use Topics   Alcohol  use: No   Drug use: Not Currently    Family History  Problem Relation Age of Onset   Stroke Mother    Hypertension Mother    Ovarian cancer Mother    Diabetes Father    Heart disease Father    Diabetes Mellitus I Father    Heart disease Brother         01/03/2024    4:33 PM 09/12/2023    2:37 PM 05/13/2023   11:44 AM 01/28/2023    4:01  PM  GAD 7 : Generalized Anxiety Score  Nervous, Anxious, on Edge 3 3 3 3   Control/stop worrying 3 3 3 3   Worry too much - different things 3 3 3 2   Trouble relaxing 3 3 3 3   Restless 3 3 3 3   Easily annoyed or irritable 3 3 3 3   Afraid - awful might happen 3 3 3 3   Total GAD 7 Score 21 21 21 20   Anxiety Difficulty Extremely difficult Extremely difficult Very difficult Extremely difficult       01/03/2024    4:32 PM 09/12/2023    2:37 PM 06/04/2023   10:51 AM  Depression screen PHQ 2/9  Decreased Interest 3 3 3   Down, Depressed, Hopeless 3 3 3   PHQ - 2 Score 6 6 6   Altered sleeping 3 3 3   Tired, decreased energy 3 3 3   Change in appetite 3 3 3   Feeling bad or failure about yourself  3 3 3   Trouble concentrating 3 3 1   Moving slowly or fidgety/restless 0 3 0  Suicidal thoughts 0 2 0  PHQ-9 Score 21 26 19   Difficult doing work/chores Very  difficult Extremely dIfficult Very difficult    BP Readings from Last 3 Encounters:  09/12/23 114/78  06/04/23 126/77  06/04/23 110/74    Wt Readings from Last 3 Encounters:  09/12/23 247 lb (112 kg)  06/04/23 250 lb (113.4 kg)  06/04/23 250 lb (113.4 kg)    There were no vitals taken for this visit.  Physical Exam General: Speaking full sentences, no audible heavy breathing. Sounds alert and appropriately interactive.   Recent Labs     Component Value Date/Time   NA 143 08/19/2020 0000   NA 145 07/17/2011 1902   K 4.8 08/19/2020 0000   K 3.7 07/17/2011 1902   CL 104 08/19/2020 0000   CL 109 (H) 07/17/2011 1902   CO2 17 08/19/2020 0000   CO2 25 07/17/2011 1902   GLUCOSE 78 10/01/2017 0208   GLUCOSE 92 07/17/2011 1902   BUN 10 08/19/2020 0000   BUN 5 (L) 07/17/2011 1902   CREATININE 0.8 08/19/2020 0000   CREATININE 0.60 10/01/2017 0208   CREATININE 0.69 07/17/2011 1902   CALCIUM 8.9 08/19/2020 0000   CALCIUM 8.8 07/17/2011 1902   PROT 7.3 10/01/2017 0119   PROT 7.3 07/17/2011 1902   ALBUMIN 4.2 08/19/2020 0000   ALBUMIN 3.6 07/17/2011 1902   AST 13 08/19/2020 0000   AST 18 07/17/2011 1902   ALT 10 08/19/2020 0000   ALT 22 07/17/2011 1902   ALKPHOS 101 08/19/2020 0000   ALKPHOS 82 07/17/2011 1902   BILITOT 0.4 10/01/2017 0119   BILITOT 0.2 07/17/2011 1902   GFRNONAA >60 10/01/2017 0119   GFRNONAA >60 07/17/2011 1902   GFRAA >60 10/01/2017 0119   GFRAA >60 07/17/2011 1902    Lab Results  Component Value Date   WBC 12.8 (H) 01/31/2017   HGB 14.6 10/01/2017   HCT 43.0 10/01/2017   MCV 89.9 01/31/2017   PLT 390 01/31/2017   No results found for: HGBA1C No results found for: CHOL, HDL, LDLCALC, LDLDIRECT, TRIG, CHOLHDL Lab Results  Component Value Date   TSH 1.900 02/01/2014     Assessment and Plan:  Generalized anxiety disorder with panic attacks Assessment & Plan: Will restart hydroxyzine , which she has used in the past with  reasonable efficacy.  Advised that side effects may be synergistic when combined with Seroquel  and Xanax , namely drowsiness.  She verbalizes  understanding and would like to proceed.  She has been advised to let me know if symptoms do not improve in the next 1 to 2 weeks, as the next step I would like to try is propranolol (though we would have to watch her blood pressure carefully).  Orders: -     hydrOXYzine  HCl; Take 1 tablet (25 mg total) by mouth 3 (three) times daily as needed.  Dispense: 90 tablet; Refill: 1  Chronic midline low back pain with bilateral sciatica Assessment & Plan: Refill ibuprofen  800 mg.  Advised to take with dinner.  Orders: -     Ibuprofen ; Take 1 tablet (800 mg total) by mouth every 8 (eight) hours as needed for moderate pain (pain score 4-6).  Dispense: 90 tablet; Refill: 1     No follow-ups on file.   I discussed the above assessment and treatment plan with the patient. The patient was provided an opportunity to ask questions and all were answered. The patient agreed with the plan and demonstrated an understanding of the instructions. The patient was advised to call back or seek an in-person evaluation if the symptoms worsen or if the condition fails to improve as anticipated. I provided a total time of 20 minutes inclusive of time utilized for medical chart review, information gathering, care coordination with staff, and documentation completion.  Rolan Hoyle, PA-C, DMSc, [facility] Primary Care and [facility] [phone]

## 2024-01-03 NOTE — Assessment & Plan Note (Signed)
 Refill ibuprofen  800 mg.  Advised to take with dinner.

## 2024-01-03 NOTE — Progress Notes (Deleted)
 Date:  01/03/2024   Name:  Denise Ellis   DOB:  1980/05/31   MRN:  [id number]   Chief Complaint: No chief complaint on file.  HPI    Medication list has been reviewed and updated.  No outpatient medications have been marked as taking for the 01/03/24 encounter (Appointment) with Manya Toribio SQUIBB, PA.   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   Radial styloid tenosynovitis 01/28/2023   Spinal stenosis of lumbar region 01/28/2023   Sprain of metacarpophalangeal joint 01/28/2023   Chronic prescription benzodiazepine use 01/28/2023   Depo-Provera  contraceptive status 09/05/2022   Symptomatic mammary hypertrophy 08/30/2022   Endometriosis 08/30/2022   History of cervical dysplasia 08/30/2022   Dyshidrotic eczema 07/16/2022   Chronic obstructive pulmonary disease (HCC) 07/31/2021   Nephrolithiasis 08/31/2020   Ureteric stone 05/11/2020   PTSD (post-traumatic stress disorder) 02/01/2017   Hostility 02/15/2016   Drug-seeking behavior 02/15/2016   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HCC) 11/14/2015   Chronic pain associated with significant psychosocial dysfunction 05/25/2014   Pain in limb 01/31/2014   Class 2 severe obesity with serious comorbidity in adult (HCC) 01/31/2014   Chronic leg pain    Chronic back pain    Hypovitaminosis D 08/10/2013   Insomnia 07/20/2013   Paroxysmal nocturnal dyspnea 07/20/2013   Generalized anxiety disorder with panic attacks 08/25/2012   Sciatica 08/25/2012   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   Esophageal reflux 08/25/2012   Suicidal ideation 08/25/2012   History of TIA (transient ischemic  attack) 08/25/2012   Depressive disorder 06/13/2012   Degeneration of intervertebral disc of lumbar region 06/13/2012   Hand joint pain 03/28/2009   Anemia, B12 deficiency 03/26/2009   Polycystic ovaries 10/06/2008    Allergies  Allergen Reactions   Ceftriaxone  Other (See Comments)    Tightness in chest, throat   Gabapentin Anaphylaxis and Other (See Comments)   Naproxen  Hives, Nausea And Vomiting and Other (See Comments)   Codeine  Other (See Comments)   Ibuprofen  Hives and Nausea And Vomiting    Other Reaction(s): rash, vomiting, unknown at this time   Methocarbamol  Nausea And Vomiting   Other Other (See Comments)   Meloxicam Hives and Other (See Comments)   Pregabalin     Other Reaction(s): made her pain worse   Gadolinium Derivatives Nausea And Vomiting   Oxycodone -Acetaminophen      Other Reaction(s): unknown at this time    Immunization History  Administered Date(s) Administered   Tdap 05/28/2007    Past Surgical History:  Procedure Laterality Date   APPENDECTOMY      Social History   Tobacco Use   Smoking status: Some Days    Current packs/day: 0.50    Average packs/day: 0.5 packs/day for 18.0 years (9.0 ttl pk-yrs)    Types: Cigarettes   Smokeless tobacco: Never  Vaping Use   Vaping status: Never Used  Substance Use Topics   Alcohol  use: No   Drug use: Not Currently    Family History  Problem Relation Age of Onset   Stroke Mother    Hypertension Mother    Ovarian cancer Mother    Diabetes Father    Heart disease  Father    Diabetes Mellitus I Father    Heart disease Brother         09/12/2023    2:37 PM 05/13/2023   11:44 AM 01/28/2023    4:01 PM 12/26/2022    8:55 AM  GAD 7 : Generalized Anxiety Score  Nervous, Anxious, on Edge 3 3 3 3   Control/stop worrying 3 3 3 3   Worry too much - different things 3 3 2 3   Trouble relaxing 3 3 3 3   Restless 3 3 3 3   Easily annoyed or irritable 3 3 3 2   Afraid - awful might happen 3 3 3 2   Total GAD 7  Score 21 21 20 19   Anxiety Difficulty Extremely difficult Very difficult Extremely difficult Very difficult       09/12/2023    2:37 PM 06/04/2023   10:51 AM 06/04/2023   10:50 AM  Depression screen PHQ 2/9  Decreased Interest 3 3 3   Down, Depressed, Hopeless 3 3 3   PHQ - 2 Score 6 6 6   Altered sleeping 3 3   Tired, decreased energy 3 3   Change in appetite 3 3   Feeling bad or failure about yourself  3 3   Trouble concentrating 3 1   Moving slowly or fidgety/restless 3 0   Suicidal thoughts 2 0   PHQ-9 Score 26 19   Difficult doing work/chores Extremely dIfficult Very difficult     BP Readings from Last 3 Encounters:  09/12/23 114/78  06/04/23 126/77  06/04/23 110/74    Wt Readings from Last 3 Encounters:  09/12/23 247 lb (112 kg)  06/04/23 250 lb (113.4 kg)  06/04/23 250 lb (113.4 kg)    There were no vitals taken for this visit.  Physical Exam  Recent Labs     Component Value Date/Time   NA 143 08/19/2020 0000   NA 145 07/17/2011 1902   K 4.8 08/19/2020 0000   K 3.7 07/17/2011 1902   CL 104 08/19/2020 0000   CL 109 (H) 07/17/2011 1902   CO2 17 08/19/2020 0000   CO2 25 07/17/2011 1902   GLUCOSE 78 10/01/2017 0208   GLUCOSE 92 07/17/2011 1902   BUN 10 08/19/2020 0000   BUN 5 (L) 07/17/2011 1902   CREATININE 0.8 08/19/2020 0000   CREATININE 0.60 10/01/2017 0208   CREATININE 0.69 07/17/2011 1902   CALCIUM 8.9 08/19/2020 0000   CALCIUM 8.8 07/17/2011 1902   PROT 7.3 10/01/2017 0119   PROT 7.3 07/17/2011 1902   ALBUMIN 4.2 08/19/2020 0000   ALBUMIN 3.6 07/17/2011 1902   AST 13 08/19/2020 0000   AST 18 07/17/2011 1902   ALT 10 08/19/2020 0000   ALT 22 07/17/2011 1902   ALKPHOS 101 08/19/2020 0000   ALKPHOS 82 07/17/2011 1902   BILITOT 0.4 10/01/2017 0119   BILITOT 0.2 07/17/2011 1902   GFRNONAA >60 10/01/2017 0119   GFRNONAA >60 07/17/2011 1902   GFRAA >60 10/01/2017 0119   GFRAA >60 07/17/2011 1902    Lab Results  Component Value Date   WBC  12.8 (H) 01/31/2017   HGB 14.6 10/01/2017   HCT 43.0 10/01/2017   MCV 89.9 01/31/2017   PLT 390 01/31/2017   No results found for: HGBA1C No results found for: CHOL, HDL, LDLCALC, LDLDIRECT, TRIG, CHOLHDL Lab Results  Component Value Date   TSH 1.900 02/01/2014      Assessment and Plan:  There are no diagnoses linked to this encounter.   No follow-ups  on file.    Rolan Hoyle, PA-C, DMSc, Nutritionist [facility] [phone]

## 2024-01-03 NOTE — Assessment & Plan Note (Signed)
 Will restart hydroxyzine , which she has used in the past with reasonable efficacy.  Advised that side effects may be synergistic when combined with Seroquel  and Xanax , namely drowsiness.  She verbalizes understanding and would like to proceed.  She has been advised to let me know if symptoms do not improve in the next 1 to 2 weeks, as the next step I would like to try is propranolol (though we would have to watch her blood pressure carefully).

## 2024-01-08 ENCOUNTER — Ambulatory Visit: Admitting: Orthopedic Surgery

## 2024-01-13 ENCOUNTER — Telehealth: Payer: Self-pay | Admitting: Physician Assistant

## 2024-01-13 NOTE — Telephone Encounter (Signed)
 Patient called in after hours. We received faxes stating that her anxiety and pani attack medications aren't working, and she wanted some other medications called in; I tried to reach patient by phone to schedule her an appt to come into the office. Left vm. Pt also mentioned in another after hours fax about having PTSD

## 2024-01-13 NOTE — Telephone Encounter (Signed)
 Noted  Pt needs appt.  KP

## 2024-01-14 NOTE — Telephone Encounter (Signed)
 Called pt to schedule a appt. Left VM to call back to schedule.  KP

## 2024-01-30 ENCOUNTER — Other Ambulatory Visit: Payer: Self-pay | Admitting: Physician Assistant

## 2024-01-30 DIAGNOSIS — Z3042 Encounter for surveillance of injectable contraceptive: Secondary | ICD-10-CM

## 2024-01-30 NOTE — Telephone Encounter (Signed)
 Copied from CRM [id number]. Topic: Clinical - Medication Refill >> Jan 30, 2024  3:10 PM Wess S wrote: Medication: medroxyPROGESTERone  (DEPO-PROVERA ) 150 MG/ML injection   Has the patient contacted their pharmacy? No (Agent: If no, request that the patient contact the pharmacy for the refill. If patient does not wish to contact the pharmacy document the reason why and proceed with request.) (Agent: If yes, when and what did the pharmacy advise?)  This is the patient's preferred pharmacy:  [facility] - [place], [place] - [address] [place] [address] Phone: [phone] Fax: [phone]  Is this the correct pharmacy for this prescription? Yes If no, delete pharmacy and type the correct one.   Has the prescription been filled recently? No  Is the patient out of the medication? Yes  Has the patient been seen for an appointment in the last year OR does the patient have an upcoming appointment? Yes  Can we respond through MyChart? Yes  Agent: Please be advised that Rx refills may take up to 3 business days. We ask that you follow-up with your pharmacy.

## 2024-01-31 ENCOUNTER — Ambulatory Visit: Admitting: Physician Assistant

## 2024-02-03 ENCOUNTER — Ambulatory Visit
Admission: RE | Admit: 2024-02-03 | Discharge: 2024-02-03 | Disposition: A | Source: Ambulatory Visit | Attending: Physician Assistant | Admitting: Physician Assistant

## 2024-02-03 ENCOUNTER — Encounter: Payer: Self-pay | Admitting: Physician Assistant

## 2024-02-03 ENCOUNTER — Ambulatory Visit
Admission: RE | Admit: 2024-02-03 | Discharge: 2024-02-03 | Disposition: A | Discharge status: Home / Self Care | Attending: Physician Assistant | Admitting: Physician Assistant

## 2024-02-03 ENCOUNTER — Ambulatory Visit: Admitting: Physician Assistant

## 2024-02-03 VITALS — BP 102/92 | HR 96 | Temp 98.6°F | Ht 67.0 in | Wt 255.0 lb

## 2024-02-03 DIAGNOSIS — G8929 Other chronic pain: Secondary | ICD-10-CM

## 2024-02-03 DIAGNOSIS — M549 Dorsalgia, unspecified: Secondary | ICD-10-CM

## 2024-02-03 DIAGNOSIS — G4701 Insomnia due to medical condition: Secondary | ICD-10-CM | POA: Diagnosis not present

## 2024-02-03 DIAGNOSIS — F411 Generalized anxiety disorder: Secondary | ICD-10-CM

## 2024-02-03 DIAGNOSIS — Z3042 Encounter for surveillance of injectable contraceptive: Secondary | ICD-10-CM

## 2024-02-03 DIAGNOSIS — F410 Panic disorder [episodic paroxysmal anxiety] without agoraphobia: Secondary | ICD-10-CM

## 2024-02-03 LAB — POCT PREGNANCY, URINE

## 2024-02-03 MED ORDER — QUETIAPINE FUMARATE 300 MG PO TABS: 300.0000 mg | ORAL_TABLET | Freq: Every day | ORAL | 2 refills | Status: AC

## 2024-02-03 MED ORDER — MEDROXYPROGESTERONE ACETATE 150 MG/ML IM SUSP: 150.0000 mg | INTRAMUSCULAR | 1 refills | Status: DC

## 2024-02-03 MED ORDER — GABAPENTIN 300 MG PO CAPS: 300.0000 mg | ORAL_CAPSULE | Freq: Three times a day (TID) | ORAL | 0 refills | Status: DC | PRN

## 2024-02-03 MED ORDER — ALPRAZOLAM 1 MG PO TABS: 1.0000 mg | ORAL_TABLET | Freq: Three times a day (TID) | ORAL | 2 refills | Status: DC | PRN

## 2024-02-03 NOTE — Telephone Encounter (Signed)
Refilled during visit today

## 2024-02-03 NOTE — Telephone Encounter (Signed)
 Please review.  KP

## 2024-02-03 NOTE — Progress Notes (Signed)
 Date:  02/03/2024   Name:  Denise Ellis   DOB:  02-12-1981   MRN:  [id number]   Chief Complaint: Back Pain (X2 days, getting worse )  Back Pain This is a new problem. Episode onset: X2 days. The problem occurs constantly. The problem has been gradually worsening since onset. Pain location: lower back. Radiates to: down legs into pelvis. The pain is at a severity of 8/10. The pain is moderate. The pain is The same all the time. The symptoms are aggravated by bending (movement). Stiffness is present All day. Associated symptoms include abdominal pain, leg pain, numbness, pelvic pain and tingling. She has tried NSAIDs, ice and heat (icy hot, hot shower) for the symptoms. The treatment provided mild relief.    Denise Ellis presents with acute on chronic back pain reported as midline lumbar back radiating bilaterally around the hips and through the pelvis. Says she was lifting a laundry basket and something didn't feel right. She has had kidney stones before and states this does not feel similar.  She is overdue for her usual 60-month follow-up on chronic conditions.  Overdue for Depo shot but she did not bring it with her today, is requesting home administration instead. Husband joins her today and would feel comfortable doing this.   Wegovy  was approved in February, she picked it up but has been afraid to use it.   GYN Dr. Leigh ordered pelvic ultrasound back in Feb due to endometriosis - this has not been completed.  Mammogram also has not been completed.  Medication list has been reviewed and updated.  Current Meds  Medication Sig   albuterol  (PROVENTIL ) (2.5 MG/3ML) 0.083% nebulizer solution 2.5 mg.   gabapentin (NEURONTIN) 300 MG capsule Take 1 capsule (300 mg total) by mouth 3 (three) times daily as needed.   hydrOXYzine  (ATARAX ) 25 MG tablet Take 1 tablet (25 mg total) by mouth 3 (three) times daily as needed.   ibuprofen  (ADVIL ) 800 MG tablet Take 1 tablet (800 mg total) by mouth  every 8 (eight) hours as needed for moderate pain (pain score 4-6).   nystatin  cream (MYCOSTATIN ) Apply 1 Application topically 2 (two) times daily.   Respiratory Therapy Supplies (FULL KIT NEBULIZER SET) MISC 1 each by Does not apply route as needed.   triamcinolone  cream (KENALOG ) 0.1 % Apply topically 2 (two) times daily as needed. Apply to hands for dyshidrotic eczema   [DISCONTINUED] ALPRAZolam  (XANAX ) 1 MG tablet Take 1 tablet (1 mg total) by mouth 3 (three) times daily as needed for anxiety.   [DISCONTINUED] medroxyPROGESTERone  (DEPO-PROVERA ) 150 MG/ML injection Inject 1 mL (150 mg total) into the muscle every 3 (three) months.   [DISCONTINUED] medroxyPROGESTERone  Acetate 150 MG/ML SUSY Inject into the muscle.   [DISCONTINUED] QUEtiapine  (SEROQUEL ) 300 MG tablet Take 1.5-2 tablets (450-600 mg total) by mouth at bedtime. (Patient taking differently: Take 300 mg by mouth at bedtime.)   [DISCONTINUED] Semaglutide -Weight Management (WEGOVY ) 0.25 MG/0.5ML SOAJ Inject 0.25 mg into the skin once a week.     Review of Systems  Gastrointestinal:  Positive for abdominal pain.  Genitourinary:  Positive for pelvic pain.  Musculoskeletal:  Positive for back pain.  Neurological:  Positive for tingling and numbness.    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   Radial styloid tenosynovitis 01/28/2023   Spinal stenosis of lumbar region 01/28/2023  Sprain of metacarpophalangeal joint 01/28/2023   Chronic prescription benzodiazepine use 01/28/2023   Depo-Provera  contraceptive status 09/05/2022   Symptomatic mammary hypertrophy 08/30/2022   Endometriosis 08/30/2022   History of cervical dysplasia 08/30/2022   Dyshidrotic eczema 07/16/2022   Chronic obstructive pulmonary disease (HCC) 07/31/2021   Nephrolithiasis 08/31/2020   Ureteric stone 05/11/2020   PTSD  (post-traumatic stress disorder) 02/01/2017   Hostility 02/15/2016   Drug-seeking behavior 02/15/2016   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HCC) 11/14/2015   Chronic pain associated with significant psychosocial dysfunction 05/25/2014   Pain in limb 01/31/2014   Class 2 severe obesity with serious comorbidity in adult 01/31/2014   Chronic leg pain    Chronic back pain    Hypovitaminosis D 08/10/2013   Insomnia 07/20/2013   Paroxysmal nocturnal dyspnea 07/20/2013   Generalized anxiety disorder with panic attacks 08/25/2012   Sciatica 08/25/2012   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   Esophageal reflux 08/25/2012   Suicidal ideation 08/25/2012   History of TIA (transient ischemic attack) 08/25/2012   Depressive disorder 06/13/2012   Degeneration of intervertebral disc of lumbar region 06/13/2012   Hand joint pain 03/28/2009   Anemia, B12 deficiency 03/26/2009   Polycystic ovaries 10/06/2008    Allergies  Allergen Reactions   Ceftriaxone  Other (See Comments)    Tightness in chest, throat   Gabapentin Anaphylaxis and Other (See Comments)   Naproxen  Hives, Nausea And Vomiting and Other (See Comments)   Codeine  Other (See Comments)   Ibuprofen  Hives and Nausea And Vomiting    Other Reaction(s): rash, vomiting, unknown at this time   Methocarbamol  Nausea And Vomiting   Other Other (See Comments)   Meloxicam Hives and Other (See Comments)   Pregabalin     Other Reaction(s): made her pain worse   Gadolinium Derivatives Nausea And Vomiting   Oxycodone -Acetaminophen      Other Reaction(s): unknown at this time    Immunization History  Administered Date(s) Administered   Tdap 05/28/2007    Past Surgical History:  Procedure Laterality Date   APPENDECTOMY      Social History   Tobacco Use   Smoking status: Some Days    Current packs/day: 0.50    Average packs/day: 0.5 packs/day for 18.0 years (9.0 ttl pk-yrs)    Types: Cigarettes   Smokeless  tobacco: Never  Vaping Use   Vaping status: Never Used  Substance Use Topics   Alcohol  use: No   Drug use: Not Currently    Family History  Problem Relation Age of Onset   Stroke Mother    Hypertension Mother    Ovarian cancer Mother    Diabetes Father    Heart disease Father    Diabetes Mellitus I Father    Heart disease Brother         01/03/2024    4:33 PM 09/12/2023    2:37 PM 05/13/2023   11:44 AM 01/28/2023    4:01 PM  GAD 7 : Generalized Anxiety Score  Nervous, Anxious, on Edge 3 3 3 3   Control/stop worrying 3 3 3 3   Worry too much - different things 3 3 3 2   Trouble relaxing 3 3 3 3   Restless 3 3 3 3   Easily annoyed or irritable 3 3 3 3   Afraid - awful might happen 3 3 3 3   Total GAD 7 Score 21 21 21 20   Anxiety Difficulty Extremely difficult Extremely difficult Very difficult Extremely difficult       01/03/2024  4:32 PM 09/12/2023    2:37 PM 06/04/2023   10:51 AM  Depression screen PHQ 2/9  Decreased Interest 3 3 3   Down, Depressed, Hopeless 3 3 3   PHQ - 2 Score 6 6 6   Altered sleeping 3 3 3   Tired, decreased energy 3 3 3   Change in appetite 3 3 3   Feeling bad or failure about yourself  3 3 3   Trouble concentrating 3 3 1   Moving slowly or fidgety/restless 0 3 0  Suicidal thoughts 0 2 0  PHQ-9 Score 21 26 19   Difficult doing work/chores Very difficult Extremely dIfficult Very difficult    BP Readings from Last 3 Encounters:  02/03/24 (!) 102/92  09/12/23 114/78  06/04/23 126/77    Wt Readings from Last 3 Encounters:  02/03/24 255 lb (115.7 kg)  09/12/23 247 lb (112 kg)  06/04/23 250 lb (113.4 kg)    BP (!) 102/92   Pulse 96   Temp 98.6 F (37 C)   Ht 5' 7 (1.702 m)   Wt 255 lb (115.7 kg)   SpO2 96%   BMI 39.94 kg/m   Physical Exam Vitals and nursing note reviewed.  Constitutional:      Appearance: Normal appearance.  Cardiovascular:     Rate and Rhythm: Normal rate.  Pulmonary:     Effort: Pulmonary effort is normal.   Abdominal:     General: There is no distension.  Musculoskeletal:        General: Normal range of motion.     Comments: Midline lumbar spinal tenderness with generalized tenderness of bilateral lumbar musculature. Negative CVA bump test bilaterally. Gait antalgic.   Skin:    General: Skin is warm and dry.  Neurological:     Mental Status: She is alert and oriented to person, place, and time.  Psychiatric:        Mood and Affect: Mood and affect normal.     Recent Labs     Component Value Date/Time   NA 143 08/19/2020 0000   NA 145 07/17/2011 1902   K 4.8 08/19/2020 0000   K 3.7 07/17/2011 1902   CL 104 08/19/2020 0000   CL 109 (H) 07/17/2011 1902   CO2 17 08/19/2020 0000   CO2 25 07/17/2011 1902   GLUCOSE 78 10/01/2017 0208   GLUCOSE 92 07/17/2011 1902   BUN 10 08/19/2020 0000   BUN 5 (L) 07/17/2011 1902   CREATININE 0.8 08/19/2020 0000   CREATININE 0.60 10/01/2017 0208   CREATININE 0.69 07/17/2011 1902   CALCIUM 8.9 08/19/2020 0000   CALCIUM 8.8 07/17/2011 1902   PROT 7.3 10/01/2017 0119   PROT 7.3 07/17/2011 1902   ALBUMIN 4.2 08/19/2020 0000   ALBUMIN 3.6 07/17/2011 1902   AST 13 08/19/2020 0000   AST 18 07/17/2011 1902   ALT 10 08/19/2020 0000   ALT 22 07/17/2011 1902   ALKPHOS 101 08/19/2020 0000   ALKPHOS 82 07/17/2011 1902   BILITOT 0.4 10/01/2017 0119   BILITOT 0.2 07/17/2011 1902   GFRNONAA >60 10/01/2017 0119   GFRNONAA >60 07/17/2011 1902   GFRAA >60 10/01/2017 0119   GFRAA >60 07/17/2011 1902    Lab Results  Component Value Date   WBC 12.8 (H) 01/31/2017   HGB 14.6 10/01/2017   HCT 43.0 10/01/2017   MCV 89.9 01/31/2017   PLT 390 01/31/2017   No results found for: HGBA1C No results found for: CHOL, HDL, LDLCALC, LDLDIRECT, TRIG, CHOLHDL Lab Results  Component Value Date  TSH 1.900 02/01/2014      Assessment and Plan:  1. Acute on chronic back pain (Primary) Patient already using ibuprofen  800 mg.  Does not want to use  muscle relaxers as they make her nauseated.  Does not want to use prednisone due to potential for mood swing.  Will proceed with small dispense of gabapentin, but cautioned on oversedation especially when combined with Seroquel  and Xanax .  She and husband verbalize understanding  Lumbar x-ray obtained today.  Initial review by me suggest no fracture, fair alignment.  There are some peculiar calcifications in some views which seem to be in the subcutaneous tissue of the back; I will wait for radiology interpretation and if not mentioned on the report I will inquire about this.  - gabapentin (NEURONTIN) 300 MG capsule; Take 1 capsule (300 mg total) by mouth 3 (three) times daily as needed.  Dispense: 30 capsule; Refill: 0 - DG Lumbar Spine Complete; Future  2. Depo-Provera  contraceptive status Pregnancy test negative.  Restart Depo.  Encouraged to proceed with GYN workup as planned.  Would suggest hysterectomy if also deemed appropriate by GYN. - POCT Pregnancy, Urine - medroxyPROGESTERone  (DEPO-PROVERA ) 150 MG/ML injection; Inject 1 mL (150 mg total) into the muscle every 3 (three) months.  Dispense: 1 mL; Refill: 1  3. Generalized anxiety disorder with panic attacks Refill Xanax  - ALPRAZolam  (XANAX ) 1 MG tablet; Take 1 tablet (1 mg total) by mouth 3 (three) times daily as needed for anxiety.  Dispense: 90 tablet; Refill: 2  4. Insomnia due to medical condition Refill Seroquel  - QUEtiapine  (SEROQUEL ) 300 MG tablet; Take 1-1.5 tablets (300-450 mg total) by mouth at bedtime.  Dispense: 120 tablet; Refill: 2    Follow-up TBD   Rolan Hoyle, PA-C, DMSc, Nutritionist [facility] Sports [facility] [phone]

## 2024-02-03 NOTE — Telephone Encounter (Signed)
 Requested medications are due for refill today.  yes  Requested medications are on the active medications list.  yes  Last refill. 09/10/2023 1mL 0 rf  Future visit scheduled.   yes  Notes to clinic.  Medication not assigned to a  protocol. Please review for refill.    Requested Prescriptions  Pending Prescriptions Disp Refills   medroxyPROGESTERone  (DEPO-PROVERA ) 150 MG/ML injection 1 mL 0    Sig: Inject 1 mL (150 mg total) into the muscle every 3 (three) months.     Off-Protocol Failed - 02/03/2024 12:20 PM      Failed - Medication not assigned to a protocol, review manually.      Passed - Valid encounter within last 12 months    Recent Outpatient Visits           1 month ago Generalized anxiety disorder with panic attacks   [facility] Medicine at [facility], Toribio SQUIBB, [place]   4 months ago Generalized anxiety disorder with panic attacks   [facility] Medicine at [facility], Toribio SQUIB[place], [place]   8 months ago Depo-Provera  contraceptive status   [facility] Medicine at [facility], Toribio SQUIB[place], [place]

## 2024-02-04 ENCOUNTER — Ambulatory Visit: Payer: Self-pay

## 2024-02-04 NOTE — Telephone Encounter (Signed)
 Unable to reach patient for triage x 3 attempts.

## 2024-02-04 NOTE — Telephone Encounter (Signed)
 Copied from CRM [id number]. Topic: Clinical - Prescription Issue >> Feb 04, 2024  9:12 AM Cleave MATSU wrote: Reason for CRM: pt said medicine that was prescribed to her yesterday for her back didn't work. She dont know what else to do she sat in the tub and let the warm water run over back but she's still in pain   Nurse attempted to return pt call: call rang & then sounded like someone picked up/no longer ringing: nurse said hello several times: no answer and not able to leave message.

## 2024-02-04 NOTE — Telephone Encounter (Signed)
 Please review.  KP

## 2024-02-05 ENCOUNTER — Ambulatory Visit: Admitting: Physician Assistant

## 2024-02-05 ENCOUNTER — Ambulatory Visit: Payer: Self-pay | Admitting: *Deleted

## 2024-02-05 ENCOUNTER — Telehealth: Payer: Self-pay

## 2024-02-05 DIAGNOSIS — M5442 Lumbago with sciatica, left side: Secondary | ICD-10-CM | POA: Diagnosis not present

## 2024-02-05 DIAGNOSIS — M5126 Other intervertebral disc displacement, lumbar region: Secondary | ICD-10-CM | POA: Diagnosis not present

## 2024-02-05 DIAGNOSIS — R102 Pelvic and perineal pain unspecified side: Secondary | ICD-10-CM | POA: Diagnosis not present

## 2024-02-05 DIAGNOSIS — M5441 Lumbago with sciatica, right side: Secondary | ICD-10-CM | POA: Diagnosis not present

## 2024-02-05 DIAGNOSIS — G834 Cauda equina syndrome: Secondary | ICD-10-CM | POA: Diagnosis not present

## 2024-02-05 DIAGNOSIS — Z881 Allergy status to other antibiotic agents status: Secondary | ICD-10-CM | POA: Diagnosis not present

## 2024-02-05 DIAGNOSIS — J449 Chronic obstructive pulmonary disease, unspecified: Secondary | ICD-10-CM | POA: Diagnosis not present

## 2024-02-05 DIAGNOSIS — Z79899 Other long term (current) drug therapy: Secondary | ICD-10-CM | POA: Diagnosis not present

## 2024-02-05 DIAGNOSIS — F17210 Nicotine dependence, cigarettes, uncomplicated: Secondary | ICD-10-CM | POA: Diagnosis not present

## 2024-02-05 DIAGNOSIS — Z8673 Personal history of transient ischemic attack (TIA), and cerebral infarction without residual deficits: Secondary | ICD-10-CM | POA: Diagnosis not present

## 2024-02-05 NOTE — Telephone Encounter (Signed)
 Copied from CRM [id number]. Topic: General - Other >> Feb 05, 2024  9:41 AM Willma R wrote: Reason for CRM: Patient was in the hospital last night, states she is awaiting a call from [facility], when he calls patient would like for him to speak with her husband, Debby.  Debby can be reached at [phone]

## 2024-02-05 NOTE — Telephone Encounter (Signed)
 Dan spoke to pt. Told her to go to the ER.  KP

## 2024-02-05 NOTE — Telephone Encounter (Signed)
 Please review.  KP

## 2024-02-05 NOTE — Telephone Encounter (Signed)
 Spoke with [employer]. Advised she needs to go to a large hospital with MRI. Probably [facility] would be best.   She sounds oversedated and slurring her words on the phone. This was my concern with gabapentin, seroquel , Xanax , and now Percocet from her [facility] ED visit. Said she wants to take a nap - I advised against this.   She asked for outpatient MRI imaging. I do not feel this is an option due to her intractable pain, urinary incontinence, and subjective inability to walk.   Called husband Debby as well and LVM informing him of the situation.

## 2024-02-05 NOTE — Telephone Encounter (Signed)
 Recommended ED due to sx continuing. Patient requesting appt and does not want to go back to ED. Please advise regarding appt   CAL notified      Only or Action Required?: Action required by provider: request for appointment and update on patient condition.  Patient was last seen in primary care on 02/03/2024 by Manya Toribio SQUIBB, PA.  Called Nurse Triage reporting Back Pain.  Symptoms began several days ago.  Interventions attempted: Other: went to ED and still reports same sx.  Symptoms are: gradually worsening.  Triage Disposition: Go to ED Now (Notify PCP)  Patient/caregiver understands and will follow disposition?: No, wishes to speak with PCP     from CRM [id number]. Topic: Clinical - Red Word Triage >> Feb 05, 2024  7:58 AM Emylou G wrote: [facility] that prompted transfer to Nurse Triage: Lower back, legs, and pelvic - currenly pain, havent slept in 3 days.SABRA just left hospital Reason for Disposition  [1] Loss of bladder or bowel control (urine or bowel incontinence; wetting self, leaking stool) AND [2] new-onset  Answer Assessment - Initial Assessment Questions Recommended go back to ED due to sx of back pain not relieved incontinence and bilateral LE numbness.  See ED encounter from [facility]. Please advise regarding appt.       1. ONSET: When did the pain begin? (e.g., minutes, hours, days)     4 days ago  2. LOCATION: Where does it hurt? (upper, mid or lower back)     Low back pain  3. SEVERITY: How bad is the pain?  (e.g., Scale 1-10; mild, moderate, or severe)     8/10 4. PATTERN: Is the pain constant? (e.g., yes, no; constant, intermittent)      Constant pain  5. RADIATION: Does the pain shoot into your legs or somewhere else?     Into legs and into pelvis 6. CAUSE:  What do you think is causing the back pain?      Picking up laundry basket 02/01/24 7. BACK OVERUSE:  Any recent lifting of heavy objects, strenuous work or exercise?     Lifting  laundry basket  8. MEDICINES: What have you taken so far for the pain? (e.g., nothing, acetaminophen , NSAIDS)     Toradol  shot not effective,  9. NEUROLOGIC SYMPTOMS: Do you have any weakness, numbness, or problems with bowel/bladder control?     Bilateral feet and legs numb, losing control of bladder  10. OTHER SYMPTOMS: Do you have any other symptoms? (e.g., fever, abdomen pain, burning with urination, blood in urine)       Back radiate to pelvic area and to left abdominal area. Can not lay on back . Not slept in 3 days.  11. PREGNANCY: Is there any chance you are pregnant? When was your last menstrual period?       No menstrual cycles. Received dep shot while in hospital  Protocols used: Back Pain-A-AH

## 2024-02-07 ENCOUNTER — Ambulatory Visit: Payer: Self-pay

## 2024-02-07 ENCOUNTER — Encounter: Payer: Self-pay | Admitting: Physician Assistant

## 2024-02-07 ENCOUNTER — Telehealth (INDEPENDENT_AMBULATORY_CARE_PROVIDER_SITE_OTHER): Admitting: Physician Assistant

## 2024-02-07 DIAGNOSIS — G8929 Other chronic pain: Secondary | ICD-10-CM

## 2024-02-07 DIAGNOSIS — M549 Dorsalgia, unspecified: Secondary | ICD-10-CM | POA: Diagnosis not present

## 2024-02-07 NOTE — Progress Notes (Signed)
 Date:  02/07/2024   Name:  Denise Ellis   DOB:  1981-02-12   MRN:  [id number]   I connected with Powell Garland on 02/07/24 via MyChart Video and verified that I am speaking with the correct person using appropriate identifiers. The limitations, risks, security and privacy concerns of performing an evaluation and management service by MyChart Video, including the higher likelihood of inaccurate diagnoses and treatments, and the availability of in person appointments were reviewed. The possible need of an additional face-to-face encounter for complete and high quality delivery of care was discussed. The patient was also made aware that there may be a patient responsible charge related to this service. The patient expressed understanding and wishes to proceed.   Provider location is in medical facility [facility] at [facility]). Patient location is at their home People involved in care of the patient during this telehealth encounter were myself, my CMA, and my front office/scheduling team member.    *Connection via MyChart video was attempted today several times but unable to be completed due to technical difficulties.  Proceeded with visit using Doximity video call.   Chief Complaint: Back Pain (Still having back pain /)  HPI   Denise Ellis presents virtually today for ongoing pelvic and back pain which has been worsening over the last 4 to 5 days.  Today her mental status seems altered, overly sedated, nodding off, poor recollection of recent events (thought she went to the hospital on Saturday but in fact it was just 2 days ago), slurred speech.  She does not remember our conversation from yesterday or the day before.  She has not listened to her voicemail but I left her yesterday regarding her x-ray showing significant constipation.  She has not heeded my advice to proceed to large hospital with MRI capabilities such as [facility] or [facility].  Per my note 02/05/2024 Spoke with Powell. Advised she needs to go to a large hospital with MRI. Probably [facility] would be best. She sounds oversedated and slurring her words on the phone. This was my concern with gabapentin, seroquel , Xanax , and now Percocet from her [facility] ED visit. Said she wants to take a nap - I advised against this. She asked for outpatient MRI imaging. I do not feel this is an option due to her intractable pain, urinary incontinence, and subjective inability to walk. Called husband Denise Ellis as well and LVM informing him of the situation.   Medication list has been reviewed and updated.  Current Meds  Medication Sig   albuterol  (PROVENTIL ) (2.5 MG/3ML) 0.083% nebulizer solution 2.5 mg.   ALPRAZolam  (XANAX ) 1 MG tablet Take 1 tablet (1 mg total) by mouth 3 (three) times daily as needed for anxiety.   gabapentin (NEURONTIN) 300 MG capsule Take 1 capsule (300 mg total) by mouth 3 (three) times daily as needed.   hydrOXYzine  (ATARAX ) 25 MG tablet Take 1 tablet (25 mg total) by mouth 3 (three) times daily as needed.   ibuprofen  (ADVIL ) 800 MG tablet Take 1 tablet (800 mg total) by mouth every 8 (eight) hours as needed for moderate pain (pain score 4-6).   medroxyPROGESTERone  (DEPO-PROVERA ) 150 MG/ML injection Inject 1 mL (150 mg total) into the muscle every 3 (three) months.   nystatin  cream (MYCOSTATIN ) Apply 1 Application topically 2 (two) times daily.   QUEtiapine  (SEROQUEL ) 300 MG tablet Take 1-1.5 tablets (300-450 mg total) by mouth at bedtime.  Respiratory Therapy Supplies (FULL KIT NEBULIZER SET) MISC 1 each by Does not apply route as needed.   triamcinolone  cream (KENALOG ) 0.1 % Apply topically 2 (two) times daily as needed. Apply to hands for dyshidrotic eczema     Review of Systems  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   Intertrigo 06/04/2023   Boil, breast 06/04/2023    Fibromyositis 01/28/2023   Radial styloid tenosynovitis 01/28/2023   Spinal stenosis of lumbar region 01/28/2023   Sprain of metacarpophalangeal joint 01/28/2023   Chronic prescription benzodiazepine use 01/28/2023   Depo-Provera  contraceptive status 09/05/2022   Symptomatic mammary hypertrophy 08/30/2022   Endometriosis 08/30/2022   History of cervical dysplasia 08/30/2022   Dyshidrotic eczema 07/16/2022   Chronic obstructive pulmonary disease (HCC) 07/31/2021   Nephrolithiasis 08/31/2020   Ureteric stone 05/11/2020   PTSD (post-traumatic stress disorder) 02/01/2017   Hostility 02/15/2016   Drug-seeking behavior 02/15/2016   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HCC) 11/14/2015   Chronic pain associated with significant psychosocial dysfunction 05/25/2014   Pain in limb 01/31/2014   Class 2 severe obesity with serious comorbidity in adult 01/31/2014   Chronic leg pain    Chronic back pain    Hypovitaminosis D 08/10/2013   Insomnia 07/20/2013   Paroxysmal nocturnal dyspnea 07/20/2013   Generalized anxiety disorder with panic attacks 08/25/2012   Sciatica 08/25/2012   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   Esophageal reflux 08/25/2012   Suicidal ideation 08/25/2012   History of TIA (transient ischemic attack) 08/25/2012   Depressive disorder 06/13/2012   Degeneration of intervertebral disc of lumbar region 06/13/2012   Hand joint pain 03/28/2009   Anemia, B12 deficiency 03/26/2009   Polycystic ovaries 10/06/2008    Allergies  Allergen Reactions   Ceftriaxone  Other (See Comments)    Tightness in chest, throat   Naproxen  Hives, Nausea And Vomiting and Other (See Comments)   Codeine  Other (See Comments)   Methocarbamol  Nausea And Vomiting   Other Other (See Comments)   Meloxicam Hives and Other (See Comments)   Pregabalin     Other Reaction(s): made her pain worse   Gadolinium Derivatives Nausea And Vomiting   Oxycodone -Acetaminophen      Other  Reaction(s): unknown at this time    Immunization History  Administered Date(s) Administered   Tdap 05/28/2007    Past Surgical History:  Procedure Laterality Date   APPENDECTOMY      Social History   Tobacco Use   Smoking status: Some Days    Current packs/day: 0.50    Average packs/day: 0.5 packs/day for 18.0 years (9.0 ttl pk-yrs)    Types: Cigarettes   Smokeless tobacco: Never  Vaping Use   Vaping status: Never Used  Substance Use Topics   Alcohol  use: No   Drug use: Not Currently    Family History  Problem Relation Age of Onset   Stroke Mother    Hypertension Mother    Ovarian cancer Mother    Diabetes Father    Heart disease Father    Diabetes Mellitus I Father    Heart disease Brother         01/03/2024    4:33 PM 09/12/2023    2:37 PM 05/13/2023   11:44 AM 01/28/2023    4:01 PM  GAD 7 : Generalized Anxiety Score  Nervous, Anxious, on Edge 3 3 3 3   Control/stop worrying 3 3 3 3   Worry too much - different things  3 3 3 2   Trouble relaxing 3 3 3 3   Restless 3 3 3 3   Easily annoyed or irritable 3 3 3 3   Afraid - awful might happen 3 3 3 3   Total GAD 7 Score 21 21 21 20   Anxiety Difficulty Extremely difficult Extremely difficult Very difficult Extremely difficult       01/03/2024    4:32 PM 09/12/2023    2:37 PM 06/04/2023   10:51 AM  Depression screen PHQ 2/9  Decreased Interest 3 3 3   Down, Depressed, Hopeless 3 3 3   PHQ - 2 Score 6 6 6   Altered sleeping 3 3 3   Tired, decreased energy 3 3 3   Change in appetite 3 3 3   Feeling bad or failure about yourself  3 3 3   Trouble concentrating 3 3 1   Moving slowly or fidgety/restless 0 3 0  Suicidal thoughts 0 2 0  PHQ-9 Score 21 26 19   Difficult doing work/chores Very difficult Extremely dIfficult Very difficult    BP Readings from Last 3 Encounters:  02/03/24 (!) 102/92  09/12/23 114/78  06/04/23 126/77    Wt Readings from Last 3 Encounters:  02/03/24 255 lb (115.7 kg)  09/12/23 247 lb (112  kg)  06/04/23 250 lb (113.4 kg)    There were no vitals taken for this visit.  Physical Exam General: Speech is slurred. She is nodding off. Seems disoriented. Says her days are blending together. She appears unwell.   Recent Labs     Component Value Date/Time   NA 143 08/19/2020 0000   NA 145 07/17/2011 1902   K 4.8 08/19/2020 0000   K 3.7 07/17/2011 1902   CL 104 08/19/2020 0000   CL 109 (H) 07/17/2011 1902   CO2 17 08/19/2020 0000   CO2 25 07/17/2011 1902   GLUCOSE 78 10/01/2017 0208   GLUCOSE 92 07/17/2011 1902   BUN 10 08/19/2020 0000   BUN 5 (L) 07/17/2011 1902   CREATININE 0.8 08/19/2020 0000   CREATININE 0.60 10/01/2017 0208   CREATININE 0.69 07/17/2011 1902   CALCIUM 8.9 08/19/2020 0000   CALCIUM 8.8 07/17/2011 1902   PROT 7.3 10/01/2017 0119   PROT 7.3 07/17/2011 1902   ALBUMIN 4.2 08/19/2020 0000   ALBUMIN 3.6 07/17/2011 1902   AST 13 08/19/2020 0000   AST 18 07/17/2011 1902   ALT 10 08/19/2020 0000   ALT 22 07/17/2011 1902   ALKPHOS 101 08/19/2020 0000   ALKPHOS 82 07/17/2011 1902   BILITOT 0.4 10/01/2017 0119   BILITOT 0.2 07/17/2011 1902   GFRNONAA >60 10/01/2017 0119   GFRNONAA >60 07/17/2011 1902   GFRAA >60 10/01/2017 0119   GFRAA >60 07/17/2011 1902    Lab Results  Component Value Date   WBC 12.8 (H) 01/31/2017   HGB 14.6 10/01/2017   HCT 43.0 10/01/2017   MCV 89.9 01/31/2017   PLT 390 01/31/2017   No results found for: HGBA1C No results found for: CHOL, HDL, LDLCALC, LDLDIRECT, TRIG, CHOLHDL Lab Results  Component Value Date   TSH 1.900 02/01/2014     Assessment and Plan:  1. Acute on chronic back pain (Primary) I again reviewed that there is nothing further I can do for her.  I am worried about her prescription medication use. She needs to go to the hospital which I have stressed with her several times.  I called her husband again and had a conversation with him about this - I advised she should probably never take  narcotics again as we have previously discussed.  He will try to take her today.  Encouraged laxative use for constipation.     I discussed the above assessment and treatment plan with the patient. The patient was provided an opportunity to ask questions and all were answered. The patient agreed with the plan and demonstrated an understanding of the instructions. The patient was advised to call back or seek an in-person evaluation if the symptoms worsen or if the condition fails to improve as anticipated. I provided a total time of 15 minutes inclusive of time utilized for medical chart review, information gathering, care coordination with staff, and documentation completion.  Rolan Hoyle, PA-C, DMSc, Nutritionist [facility] [phone]

## 2024-02-07 NOTE — Telephone Encounter (Signed)
 Noted  KP

## 2024-02-07 NOTE — Telephone Encounter (Signed)
 FYI Only or Action Required?: FYI only for provider.  Patient was last seen in primary care on 02/03/2024 by Manya Toribio SQUIBB, PA.  Called Nurse Triage reporting Back Pain.  Symptoms began several days ago.  Interventions attempted: Prescription medications: percocet, gabapentin.  Symptoms are: gradually worsening.  Triage Disposition: See HCP Within 4 Hours (Or PCP Triage)  Patient/caregiver understands and will follow disposition?: Yes           Copied from CRM #[id number]. Topic: Clinical - Red Word Triage >> Feb 07, 2024  9:42 AM Lonell PEDLAR wrote: Red Word that prompted transfer to Nurse Triage: Patient with worsening back pain, unable to walk. Reason for Disposition  [1] SEVERE back pain (e.g., excruciating, unable to do any normal activities) AND [2] not improved 2 hours after pain medicine    Pt requesting virtual visit with PCP, reports that imaging has been done recently.  Answer Assessment - Initial Assessment Questions 1. ONSET: When did the pain begin? (e.g., minutes, hours, days)     4-5 days. Recent ED visit with degenerative disc...pinched nerve 2. LOCATION: Where does it hurt? (upper, mid or lower back)     Lower back 3. SEVERITY: How bad is the pain?  (e.g., Scale 1-10; mild, moderate, or severe)     Severe - affecting sleep, causing incontinence. Received 4 Percocet from ED 4. PATTERN: Is the pain constant? (e.g., yes, no; constant, intermittent)      constant 5. RADIATION: Does the pain shoot into your legs or somewhere else?     Radiates to pelvis 6. CAUSE:  What do you think is causing the back pain?      Unknown, see notes able 7. BACK OVERUSE:  Any recent lifting of heavy objects, strenuous work or exercise?     denies 8. MEDICINES: What have you taken so far for the pain? (e.g., nothing, acetaminophen , NSAIDS)     Percocet, gabapentin. 9. NEUROLOGIC SYMPTOMS: Do you have any weakness, numbness, or problems with bowel/bladder  control?     Yes, causing urinary incontinence d/t pain. 10. OTHER SYMPTOMS: Do you have any other symptoms? (e.g., fever, abdomen pain, burning with urination, blood in urine)       Denies.  11. PREGNANCY: Is there any chance you are pregnant? When was your last menstrual period?       N/a  Protocols used: Back Pain-A-AH

## 2024-02-19 ENCOUNTER — Other Ambulatory Visit: Payer: Self-pay | Admitting: Physician Assistant

## 2024-02-19 ENCOUNTER — Telehealth: Payer: Self-pay

## 2024-02-19 DIAGNOSIS — R21 Rash and other nonspecific skin eruption: Secondary | ICD-10-CM

## 2024-02-19 DIAGNOSIS — M549 Dorsalgia, unspecified: Secondary | ICD-10-CM

## 2024-02-19 MED ORDER — VALACYCLOVIR HCL 1 G PO TABS: 2000.0000 mg | ORAL_TABLET | Freq: Two times a day (BID) | ORAL | 1 refills | Status: AC

## 2024-02-19 NOTE — Telephone Encounter (Signed)
Refilled valacyclovir

## 2024-02-19 NOTE — Telephone Encounter (Signed)
 Copied from CRM [id number]. Topic: Clinical - Medication Question >> Feb 19, 2024  8:39 AM Gustabo D wrote: Pt needs a prescription refill for cold sores she has gotten it before but doesn't know the name of it.  This is the patient's preferred pharmacy:  [facility] - [place], [place] - [address] [place] [address] Phone: [phone] Fax: [phone]

## 2024-02-21 ENCOUNTER — Ambulatory Visit: Payer: Self-pay | Admitting: Physician Assistant

## 2024-02-21 ENCOUNTER — Other Ambulatory Visit: Payer: Self-pay | Admitting: Physician Assistant

## 2024-02-21 DIAGNOSIS — G8929 Other chronic pain: Secondary | ICD-10-CM

## 2024-02-21 DIAGNOSIS — R200 Anesthesia of skin: Secondary | ICD-10-CM

## 2024-02-21 DIAGNOSIS — R32 Unspecified urinary incontinence: Secondary | ICD-10-CM

## 2024-02-21 NOTE — Progress Notes (Signed)
 Concern for cauda equina or similar pathology. Ordering MRI

## 2024-02-21 NOTE — Telephone Encounter (Signed)
 Requested medication (s) are due for refill today: routing for review  Requested medication (s) are on the active medication list: yes  Last refill:  02/03/24  Future visit scheduled: no  Notes to clinic:  routing for review     Requested Prescriptions  Pending Prescriptions Disp Refills   gabapentin (NEURONTIN) 300 MG capsule [Pharmacy Med Name: GABAPENTIN 300 MG CAPSULE] 30 capsule 0    Sig: Take 1 capsule (300 mg total) by mouth 3 (three) times daily as needed.     Neurology: Anticonvulsants - gabapentin Failed - 02/21/2024 11:26 AM      Failed - Cr in normal range and within 360 days    Creatinine  Date Value Ref Range Status  08/19/2020 0.8 0.5 - 1.1 Final  07/17/2011 0.69 0.60 - 1.30 mg/dL Final   Creatinine, Ser  Date Value Ref Range Status  10/01/2017 0.60 0.44 - 1.00 mg/dL Final   Creatinine, Urine  Date Value Ref Range Status  07/28/2014 157.26 >20.0 mg/dL Final         Passed - Completed PHQ-2 or PHQ-9 in the last 360 days      Passed - Valid encounter within last 12 months    Recent Outpatient Visits           2 weeks ago Acute on chronic back pain   [place] [facility] at [facility], Toribio SQUIBB, [place]   2 weeks ago Acute on chronic back pain   [facility] at [facility], Toribio SQUIBB, [place]   1 month ago Generalized anxiety disorder with panic attacks   [facility] at [facility], Toribio SQUIBB, [place]   5 months ago Generalized anxiety disorder with panic attacks   [facility] Medicine at [facility], Toribio SQUIBB, [place]   8 months ago Depo-Provera  contraceptive status   [facility] Medicine at [facility], Toribio SQUIBB, [place]

## 2024-02-21 NOTE — Telephone Encounter (Signed)
 Please review.  KP

## 2024-02-21 NOTE — Telephone Encounter (Signed)
 Spoke with patient today. Per my message 2 weeks ago I emphasized the need for lumbar MRI but she was noncompliant and was not seen at the hospital. She claims she went to [facility] and left after she was told there was a 30h wait but I doubt this is true. She says she did not speak with a triage nurse. Since then, the probably has gotten worse, still having urinary incontinence, worsening numbness of both legs, fell twice yesterday as a result of this.   I will reluctantly be ordering outpatient MRI since she is refusing to heed my advise for admission.   I will not be refilling gabapentin for her as I am concerned for oversedation with her other meds.   I will need to see her to re-evaluate the breast abscesses/cysts but she is not able to come in today. Will see her Tuesday late morning around 11a. Plan for derm and plastics referral at that time.

## 2024-02-21 NOTE — Telephone Encounter (Signed)
 FYI Only or Action Required?: Action required by provider: medication refill request, referral request, and update on patient condition.  Patient was last seen in primary care on 02/07/2024 by Denise Toribio SQUIBB, PA.  Called Nurse Triage reporting Neurologic Problem.  Triage Disposition: Go to ED Now (or PCP Triage)  Patient/caregiver understands and will follow disposition?: No, wishes to speak with PCP          Copied from CRM #[id number]. Topic: Clinical - Red Word Triage >> Feb 21, 2024 11:05 AM Denise Ellis wrote: Red Word that prompted transfer to Nurse Triage: Severe nerve damage. Pain in lower back and pain radiating to legs. Feet have been numb for a weak.  Has bad boils underneath her breast that have gotten worse. Cream is not working. Boils are the size of a half dollar coin and now causing pain Reason for Disposition  Patient sounds very sick or weak to the triager  Answer Assessment - Initial Assessment Questions This RN recommends ED but pt refused. Pt refused and would like a call back at [phone]. This RN called CAL to notify of pt refusal of ED disposition  SYMPTOMS: BOILS UNDER BOTH BREASTS Onset: over 2 years Won't go away; pt states she told PCP at office visit on 10/17; pt was given a cream but it is not working Soreness  Between 8-17 boils, more since seen in office on 10/17 Color: black, purple, and green A few are oozing fluid (yellow/green) Denies fever  LOWER BACK PAIN: Pt had gone to ED a few weeks ago for lower back pain Lower back pain that shoots down legs ongoing Pt prescribed gabapentin for this but still having the pain  NUMBNESS IN BOTH FEET: Numbness/tingling (constant) in both feet started in last couple weeks Denies feet feeling cold to touch   REFERRALS/MED REFILL: Pt wants a dermatologist referral  Pt states she had a previous referral from PCP for a breast reduction and she wants to move forward with this  Pt wants a refill  of gabapentin sent to:  [facility] - [place], [place] - [address], [place] [address] Phone: [phone]  Fax: [phone]  Protocols used: Neurologic Deficit-A-AH

## 2024-02-24 ENCOUNTER — Ambulatory Visit: Admission: RE | Admit: 2024-02-24 | Source: Ambulatory Visit

## 2024-02-25 ENCOUNTER — Ambulatory Visit: Admission: RE | Admit: 2024-02-25 | Source: Ambulatory Visit

## 2024-02-25 ENCOUNTER — Ambulatory Visit: Admitting: Physician Assistant

## 2024-02-27 ENCOUNTER — Ambulatory Visit
Admission: RE | Admit: 2024-02-27 | Discharge: 2024-02-27 | Disposition: A | Discharge status: Home / Self Care | Source: Ambulatory Visit | Attending: Physician Assistant | Admitting: Physician Assistant

## 2024-02-27 DIAGNOSIS — M5441 Lumbago with sciatica, right side: Secondary | ICD-10-CM | POA: Insufficient documentation

## 2024-02-27 DIAGNOSIS — R200 Anesthesia of skin: Secondary | ICD-10-CM | POA: Insufficient documentation

## 2024-02-27 DIAGNOSIS — R32 Unspecified urinary incontinence: Secondary | ICD-10-CM | POA: Insufficient documentation

## 2024-02-27 DIAGNOSIS — G8929 Other chronic pain: Secondary | ICD-10-CM | POA: Diagnosis present

## 2024-02-27 DIAGNOSIS — M5442 Lumbago with sciatica, left side: Secondary | ICD-10-CM | POA: Insufficient documentation

## 2024-02-27 MED ORDER — GADOBUTROL 1 MMOL/ML IV SOLN
7.0000 mL | Freq: Once | INTRAVENOUS | Status: AC | PRN
  Administered 2024-02-27: 7 mL via INTRAVENOUS

## 2024-02-28 ENCOUNTER — Ambulatory Visit: Payer: Self-pay | Admitting: Physician Assistant

## 2024-02-28 ENCOUNTER — Other Ambulatory Visit: Payer: Self-pay | Admitting: Physician Assistant

## 2024-02-28 DIAGNOSIS — R32 Unspecified urinary incontinence: Secondary | ICD-10-CM

## 2024-02-28 DIAGNOSIS — G8929 Other chronic pain: Secondary | ICD-10-CM

## 2024-02-28 DIAGNOSIS — R200 Anesthesia of skin: Secondary | ICD-10-CM

## 2024-03-02 ENCOUNTER — Encounter: Payer: Self-pay | Admitting: Physician Assistant

## 2024-03-02 ENCOUNTER — Inpatient Hospital Stay: Admitting: Physician Assistant

## 2024-03-02 ENCOUNTER — Ambulatory Visit (INDEPENDENT_AMBULATORY_CARE_PROVIDER_SITE_OTHER): Admitting: Physician Assistant

## 2024-03-02 VITALS — BP 106/90 | HR 118 | Ht 67.0 in | Wt 252.0 lb

## 2024-03-02 DIAGNOSIS — R32 Unspecified urinary incontinence: Secondary | ICD-10-CM | POA: Diagnosis not present

## 2024-03-02 DIAGNOSIS — N611 Abscess of the breast and nipple: Secondary | ICD-10-CM | POA: Diagnosis not present

## 2024-03-02 DIAGNOSIS — G894 Chronic pain syndrome: Secondary | ICD-10-CM | POA: Diagnosis not present

## 2024-03-02 DIAGNOSIS — Z79899 Other long term (current) drug therapy: Secondary | ICD-10-CM

## 2024-03-02 DIAGNOSIS — M549 Dorsalgia, unspecified: Secondary | ICD-10-CM | POA: Diagnosis not present

## 2024-03-02 MED ORDER — GABAPENTIN 300 MG PO CAPS: 300.0000 mg | ORAL_CAPSULE | Freq: Every evening | ORAL | 0 refills | Status: AC | PRN

## 2024-03-02 MED ORDER — DOXYCYCLINE HYCLATE 100 MG PO TABS: 100.0000 mg | ORAL_TABLET | Freq: Two times a day (BID) | ORAL | 0 refills | Status: AC

## 2024-03-02 NOTE — Progress Notes (Signed)
 Date:  03/02/2024   Name:  Denise Ellis   DOB:  April 29, 1980   MRN:  [id number]   Chief Complaint: Hospitalization Follow-up  HPI  Allegra presents primarily today to follow-up on her acute on chronic back pain after completing lumbar MRI recently 02/27/2024 which showed mild degenerative disc disease and L3-L5 right foraminal stenosis similar when compared to prior from 2019.  Gabapentin seemed to help with her pain especially with restlessness at night.  Still experiencing urinary incontinence about once per day with full loss of bladder control, patient attributes to back pain.   Recurrent abscesses of bilateral breasts/inframammary crease without involvement of axillae or groin.  Has consult with GYN in 3 days regarding consideration of hysterectomy for endometriosis, which is presently controlled with Depo q3m. GYN Dr. Leigh ordered pelvic ultrasound back in Feb due to endometriosis - this has not been completed.  Mammogram also has not been completed.  Chronic severe depression and anxiety, attending group therapy sessions roughly every 2 weeks which she finds to be of modest benefit.  She remains relatively active in and around the house, takes care of her father who lives in the home. Lives on [place] acres. Mows lawn, fixes meals, etc.   Medication list has been reviewed and updated.  Current Meds  Medication Sig   albuterol  (PROVENTIL ) (2.5 MG/3ML) 0.083% nebulizer solution 2.5 mg.   ALPRAZolam  (XANAX ) 1 MG tablet Take 1 tablet (1 mg total) by mouth 3 (three) times daily as needed for anxiety.   doxycycline (VIBRA-TABS) 100 MG tablet Take 1 tablet (100 mg total) by mouth 2 (two) times daily for 7 days. Do not take with dairy. This medication INCREASES SUN SENSITIVITY so avoid direct sunlight.   hydrOXYzine  (ATARAX ) 25 MG tablet Take 1 tablet (25 mg total) by mouth 3 (three) times daily as needed.   ibuprofen  (ADVIL ) 800 MG tablet Take 1 tablet (800 mg total) by mouth every 8  (eight) hours as needed for moderate pain (pain score 4-6).   medroxyPROGESTERone  (DEPO-PROVERA ) 150 MG/ML injection Inject 1 mL (150 mg total) into the muscle every 3 (three) months.   nystatin  cream (MYCOSTATIN ) Apply 1 Application topically 2 (two) times daily.   QUEtiapine  (SEROQUEL ) 300 MG tablet Take 1-1.5 tablets (300-450 mg total) by mouth at bedtime.   Respiratory Therapy Supplies (FULL KIT NEBULIZER SET) MISC 1 each by Does not apply route as needed.   triamcinolone  cream (KENALOG ) 0.1 % Apply topically 2 (two) times daily as needed. Apply to hands for dyshidrotic eczema   valACYclovir  (VALTREX ) 1000 MG tablet Take 2 tablets (2,000 mg total) by mouth 2 (two) times daily. For cold sores - Take 2 tablets every 12h for one day at the onset of symptoms, then stop.   [DISCONTINUED] gabapentin (NEURONTIN) 300 MG capsule Take 1 capsule (300 mg total) by mouth 3 (three) times daily as needed.     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   Spinal stenosis of lumbar region 01/28/2023   Chronic prescription benzodiazepine use 01/28/2023   Depo-Provera  contraceptive status 09/05/2022   Symptomatic mammary hypertrophy 08/30/2022   Endometriosis 08/30/2022   History of cervical dysplasia 08/30/2022   Dyshidrotic eczema 07/16/2022   Chronic obstructive pulmonary disease (HCC) 07/31/2021   Nephrolithiasis 08/31/2020   PTSD (post-traumatic stress disorder) 02/01/2017  Chronic pain associated with significant psychosocial dysfunction 05/25/2014   Class 2 severe obesity with serious comorbidity in adult 01/31/2014   Chronic leg pain    Chronic back pain    Hypovitaminosis D 08/10/2013   Insomnia 07/20/2013   Paroxysmal nocturnal dyspnea 07/20/2013   Generalized anxiety disorder with panic attacks  08/25/2012   Sciatica 08/25/2012   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   Esophageal reflux 08/25/2012   Suicidal ideation 08/25/2012   History of TIA (transient ischemic attack) 08/25/2012   Degeneration of intervertebral disc of lumbar region 06/13/2012   Major depressive disorder, recurrent 06/13/2012   Anemia, B12 deficiency 03/26/2009   Polycystic ovaries 10/06/2008    Allergies  Allergen Reactions   Ceftriaxone  Other (See Comments)    Tightness in chest, throat   Naproxen  Hives, Nausea And Vomiting and Other (See Comments)   Codeine  Other (See Comments)   Methocarbamol  Nausea And Vomiting   Other Other (See Comments)   Meloxicam Hives and Other (See Comments)   Pregabalin     Other Reaction(s): made her pain worse   Gadolinium Derivatives Nausea And Vomiting   Oxycodone -Acetaminophen      Other Reaction(s): unknown at this time    Immunization History  Administered Date(s) Administered   Tdap 05/28/2007    Past Surgical History:  Procedure Laterality Date   APPENDECTOMY      Social History   Tobacco Use   Smoking status: Some Days    Current packs/day: 0.50    Average packs/day: 0.5 packs/day for 18.0 years (9.0 ttl pk-yrs)    Types: Cigarettes   Smokeless tobacco: Never  Vaping Use   Vaping status: Never Used  Substance Use Topics   Alcohol  use: No   Drug use: Not Currently    Family History  Problem Relation Age of Onset   Stroke Mother    Hypertension Mother    Ovarian cancer Mother    Diabetes Father    Heart disease Father    Diabetes Mellitus I Father    Heart disease Brother         01/03/2024    4:33 PM 09/12/2023    2:37 PM 05/13/2023   11:44 AM 01/28/2023    4:01 PM  GAD 7 : Generalized Anxiety Score  Nervous, Anxious, on Edge 3 3 3 3   Control/stop worrying 3 3 3 3   Worry too much - different things 3 3 3 2   Trouble relaxing 3 3 3 3   Restless 3 3 3 3   Easily annoyed or irritable 3 3 3 3   Afraid - awful might happen 3 3 3 3    Total GAD 7 Score 21 21 21 20   Anxiety Difficulty Extremely difficult Extremely difficult Very difficult Extremely difficult       01/03/2024    4:32 PM 09/12/2023    2:37 PM 06/04/2023   10:51 AM  Depression screen PHQ 2/9  Decreased Interest 3 3 3   Down, Depressed, Hopeless 3 3 3   PHQ - 2 Score 6 6 6   Altered sleeping 3 3 3   Tired, decreased energy 3 3 3   Change in appetite 3 3 3   Feeling bad or failure about yourself  3 3 3   Trouble concentrating 3 3 1   Moving slowly or fidgety/restless 0 3 0  Suicidal thoughts 0 2 0  PHQ-9 Score 21  26  19    Difficult doing work/chores Very difficult Extremely dIfficult Very difficult     Data saved with a previous flowsheet row definition  BP Readings from Last 3 Encounters:  03/02/24 (!) 106/90  02/03/24 (!) 102/92  09/12/23 114/78    Wt Readings from Last 3 Encounters:  03/02/24 252 lb (114.3 kg)  02/03/24 255 lb (115.7 kg)  09/12/23 247 lb (112 kg)    BP (!) 106/90   Pulse (!) 118   Ht 5' 7 (1.702 m)   Wt 252 lb (114.3 kg)   SpO2 96%   BMI 39.47 kg/m   Physical Exam Vitals and nursing note reviewed.  Constitutional:      Appearance: Normal appearance.  Cardiovascular:     Rate and Rhythm: Normal rate.  Pulmonary:     Effort: Pulmonary effort is normal.  Abdominal:     General: There is no distension.  Musculoskeletal:        General: Normal range of motion.  Skin:    General: Skin is warm and dry.  Neurological:     Mental Status: She is alert and oriented to person, place, and time.     Gait: Gait is intact.  Psychiatric:        Mood and Affect: Affect normal. Mood is depressed.     Recent Labs     Component Value Date/Time   NA 143 08/19/2020 0000   NA 145 07/17/2011 1902   K 4.8 08/19/2020 0000   K 3.7 07/17/2011 1902   CL 104 08/19/2020 0000   CL 109 (H) 07/17/2011 1902   CO2 17 08/19/2020 0000   CO2 25 07/17/2011 1902   GLUCOSE 78 10/01/2017 0208   GLUCOSE 92 07/17/2011 1902   BUN 10  08/19/2020 0000   BUN 5 (L) 07/17/2011 1902   CREATININE 0.8 08/19/2020 0000   CREATININE 0.60 10/01/2017 0208   CREATININE 0.69 07/17/2011 1902   CALCIUM 8.9 08/19/2020 0000   CALCIUM 8.8 07/17/2011 1902   PROT 7.3 10/01/2017 0119   PROT 7.3 07/17/2011 1902   ALBUMIN 4.2 08/19/2020 0000   ALBUMIN 3.6 07/17/2011 1902   AST 13 08/19/2020 0000   AST 18 07/17/2011 1902   ALT 10 08/19/2020 0000   ALT 22 07/17/2011 1902   ALKPHOS 101 08/19/2020 0000   ALKPHOS 82 07/17/2011 1902   BILITOT 0.4 10/01/2017 0119   BILITOT 0.2 07/17/2011 1902   GFRNONAA >60 10/01/2017 0119   GFRNONAA >60 07/17/2011 1902   GFRAA >60 10/01/2017 0119   GFRAA >60 07/17/2011 1902    Lab Results  Component Value Date   WBC 12.8 (H) 01/31/2017   HGB 14.6 10/01/2017   HCT 43.0 10/01/2017   MCV 89.9 01/31/2017   PLT 390 01/31/2017   No results found for: HGBA1C No results found for: CHOL, HDL, LDLCALC, LDLDIRECT, TRIG, CHOLHDL Lab Results  Component Value Date   TSH 1.900 02/01/2014      Assessment and Plan:  1. Acute on chronic back pain (Primary) Will continue prescription for gabapentin, but emphasized that I only want this to be used at night.  Cautioned on oversedation especially in combination with Seroquel  and Xanax . - gabapentin (NEURONTIN) 300 MG capsule; Take 1 capsule (300 mg total) by mouth at bedtime as needed.  Dispense: 90 capsule; Refill: 0  2. Chronic pain associated with significant psychosocial dysfunction We discussed that despite her efforts to manage medication, her pain is inadequately controlled.  Ultimately I think it would be beneficial for her to have treatment for endometriosis that is not Depo, hopefully leading to improvement of symptoms and weight loss which would relieve some of the  burden on her back.  Ultimately I am concerned about the number and dose of medications increasing over time.  3. Urinary incontinence, unspecified type Placing referral to  urology for further evaluation.  Initially I had feared for cauda equina or nerve compression, but it seems that the spinal canal is mostly unaffected and not the cause of her urinary incontinence - Ambulatory referral to Urology  4. Breast abscess Suspect hidradenitis suppurativa.  Treat acutely with doxycycline for 7 days, referring to dermatology for ongoing management - doxycycline (VIBRA-TABS) 100 MG tablet; Take 1 tablet (100 mg total) by mouth 2 (two) times daily for 7 days. Do not take with dairy. This medication INCREASES SUN SENSITIVITY so avoid direct sunlight.  Dispense: 14 tablet; Refill: 0 - Ambulatory referral to Dermatology  5. Chronic prescription benzodiazepine use Patient [place].  She is to let me know shortly after arriving so that I can send a prescription for Xanax  to the local pharmacy there, since she will be staying there for most of December.    Follow-up to be determined pending specialist evaluation and completion of diagnostic workup as laid forth for her   Rolan Hoyle, PA-C, DMSc, Nutritionist [facility] Primary Care and Sports Medicine [facility] [phone]

## 2024-03-09 ENCOUNTER — Telehealth: Payer: Self-pay | Admitting: Physician Assistant

## 2024-03-09 NOTE — Telephone Encounter (Signed)
 Copied from CRM #[id number]. Topic: Medicare AWV >> Mar 09, 2024 10:07 AM Nathanel DEL wrote: Called LVM 03/09/2024 to sched AWV. Please schedule in office or virtual visit.   Nathanel Paschal; Care Guide Ambulatory Clinical Support [facility] [place] [facility] Direct Dial: [phone]

## 2024-03-16 NOTE — Progress Notes (Signed)
 HANIFA ANTONETTI                                          MRN: [id number]   03/16/2024   The VBCI Quality Team Specialist reviewed this patient medical record for the purposes of chart review for care gap closure. The following were reviewed: chart review for care gap closure-breast cancer screening.    VBCI Quality Team

## 2024-03-27 ENCOUNTER — Other Ambulatory Visit: Payer: Self-pay | Admitting: Physician Assistant

## 2024-03-27 DIAGNOSIS — F410 Panic disorder [episodic paroxysmal anxiety] without agoraphobia: Secondary | ICD-10-CM

## 2024-03-27 NOTE — Telephone Encounter (Unsigned)
 Copied from CRM [id number]. Topic: Clinical - Medication Refill >> Mar 27, 2024  2:38 PM Sophia H wrote: Medication: ALPRAZolam  (XANAX ) 1 MG tablet   Has the patient contacted their pharmacy? Yes, new RX to new pharmacy **Just this one time as patient is out of town, states she spoke with provider about this a few weeks ago and he advised just to call and request fill to pharmacy.  This is the patient's preferred pharmacy:   [facility] [address] [place], [place] - [address] [place] [address] Phone: [phone] Fax: [phone]  Is this the correct pharmacy for this prescription? Yes If no, delete pharmacy and type the correct one.   Has the prescription been filled recently? Yes  Is the patient out of the medication? Yes  Has the patient been seen for an appointment in the last year OR does the patient have an upcoming appointment? Yes, seen November 10  Can we respond through MyChart? No, prefers phone call   Agent: Please be advised that Rx refills may take up to 3 business days. We ask that you follow-up with your pharmacy.

## 2024-03-30 NOTE — Telephone Encounter (Signed)
 Requested medications are due for refill today.  Too soon  Requested medications are on the active medications list.  yes  Last refill. 02/03/2024 #90 2 rf  Future visit scheduled.   no  Notes to clinic.  Refill not delegated.    Requested Prescriptions  Pending Prescriptions Disp Refills   ALPRAZolam  (XANAX ) 1 MG tablet 90 tablet 2    Sig: Take 1 tablet (1 mg total) by mouth 3 (three) times daily as needed for anxiety.     Not Delegated - Psychiatry: Anxiolytics/Hypnotics 2 Failed - 03/30/2024  4:28 PM      Failed - This refill cannot be delegated      Failed - Urine Drug Screen completed in last 360 days      Passed - Patient is not pregnant      Passed - Valid encounter within last 6 months    Recent Outpatient Visits           4 weeks ago Acute on chronic back pain   [place] [facility] at [facility], [place], [place]   1 month ago Acute on chronic back pain   [facility] Care & [facility] Medicine at [facility], [place], [place]   1 month ago Acute on chronic back pain   [facility] Primary Care & Sports Medicine at [facility], [place], [place]   2 months ago Generalized anxiety disorder with panic attacks   [facility] Primary Care & Sports Medicine at [facility], [place]io [place], [place]   6 months ago Generalized anxiety disorder with panic attacks   [facility] Primary Care & Sports Medicine at [facility], Toribio SQUIBB, [place]       Future Appointments             In 1 month MacDiarmid, [place], MD [facility] Urology [facility]

## 2024-03-31 ENCOUNTER — Telehealth: Payer: Self-pay

## 2024-03-31 MED ORDER — ALPRAZOLAM 1 MG PO TABS: 1.0000 mg | ORAL_TABLET | Freq: Three times a day (TID) | ORAL | 0 refills | Status: AC | PRN

## 2024-03-31 NOTE — Telephone Encounter (Signed)
 Please review.  KP

## 2024-03-31 NOTE — Telephone Encounter (Signed)
 Refill sent ? ?KP ?

## 2024-03-31 NOTE — Telephone Encounter (Signed)
 Copied from CRM #[id number]. Topic: Clinical - Prescription Issue >> Mar 31, 2024 11:21 AM Leonette SQUIBB wrote: Reason for CRM: patient is at [facility] and is needing her Xanax .  She said there is a refill due at her Home pharmacy but she [place] get them [place].   She would like a refill sent to [facility].  [phone]

## 2024-03-31 NOTE — Telephone Encounter (Signed)
 Copied from CRM #[id number]. Topic: Clinical - Medication Question >> Mar 30, 2024  3:16 PM Shereese L wrote: Reason for CRM: Patient called in to verify the medication refill status for the ALPRAZolam  (XANAX ) 1 MG tablet and stated that she's running out of medication >> Mar 31, 2024  9:10 AM Avram MATSU wrote: Patient stated she needs her refill and told the provider she will be out of town

## 2024-03-31 NOTE — Telephone Encounter (Unsigned)
 Copied from CRM #[id number]. Topic: Clinical - Medication Question >> Mar 31, 2024 11:37 AM Hadassah PARAS wrote: Reason for CRM: Pt is requesting for PCP's nurse to call and go over some confusion there may be with medication. I asked which one and she stated all of them. She was short and requested to speak with nurse. Please advise on # [phone]

## 2024-03-31 NOTE — Telephone Encounter (Signed)
 Refill was sent in to the pharmacy listed above. Called pt left VM stating refill was sent in for xanax . Told pt to call back in regards to other medications.  KP

## 2024-04-14 ENCOUNTER — Other Ambulatory Visit: Payer: Self-pay | Admitting: Physician Assistant

## 2024-04-14 DIAGNOSIS — J101 Influenza due to other identified influenza virus with other respiratory manifestations: Secondary | ICD-10-CM

## 2024-04-14 MED ORDER — OSELTAMIVIR PHOSPHATE 75 MG PO CAPS: 75.0000 mg | ORAL_CAPSULE | Freq: Two times a day (BID) | ORAL | 0 refills | Status: AC

## 2024-04-14 NOTE — Progress Notes (Signed)
 Patient joins her spouse for his appointment with me today. Spouse positive for flu A. Patient also symptomatic as of yesterday. Treating presumptively with Tamiflu .

## 2024-04-27 ENCOUNTER — Other Ambulatory Visit: Payer: Self-pay | Admitting: Physician Assistant

## 2024-04-27 ENCOUNTER — Ambulatory Visit: Payer: Self-pay | Admitting: Physician Assistant

## 2024-04-27 DIAGNOSIS — N898 Other specified noninflammatory disorders of vagina: Secondary | ICD-10-CM

## 2024-04-27 DIAGNOSIS — M549 Dorsalgia, unspecified: Secondary | ICD-10-CM

## 2024-04-27 MED ORDER — FLUCONAZOLE 150 MG PO TABS: 150.0000 mg | ORAL_TABLET | ORAL | 0 refills | Status: AC

## 2024-04-27 NOTE — Telephone Encounter (Signed)
 FYI Only or Action Required?: Action required by provider: Requesting medication.  Patient was last seen in primary care on 03/02/2024 by Manya Toribio SQUIBB, PA.  Called Nurse Triage reporting Vaginal Itching.  Symptoms began 2 days ago.  Interventions attempted: Nothing.  Symptoms are: stable.  Triage Disposition: See Physician Within 24 Hours  Patient/caregiver understands and will follow disposition?: No Reason for Disposition  MODERATE-SEVERE itching (i.e., interferes with school, work, or sleep)  Answer Assessment - Initial Assessment Questions Patient denied scheduling an appointment, educated patient on importance of being seen an getting a UA done, patient reports she can't come in, does not have time to come in, she knows what it is and taking  1 little pill will get rid of it. Requesting medication be sent to pharmacy on file.   1. SYMPTOM: What's the main symptom you're concerned about? (e.g., pain, itching, dryness)     Itching, burning   2. LOCATION: Where is the symptoms located? (e.g., inside/outside, left/right)     Vagina  3. ONSET: When did the symptoms  start?     2 days ago  4. PAIN: Is there any pain? If Yes, ask: How bad is it? (Scale: 1-10; mild, moderate, severe)     Burning, moderate  5. ITCHING: Is there any itching? If Yes, ask: How bad is it? (Scale: 1-10; mild, moderate, severe)     Constant severe  6. CAUSE: What do you think is causing the discharge? Have you had the same problem before? What happened then?     Antibiotic, flu pill, thinks its a yeast infection  7. OTHER SYMPTOMS: Do you have any other symptoms? (e.g., fever, itching, vaginal bleeding, pain with urination, injury to genital area, vaginal foreign body)     Urinary frequency, slight red tint to urine, not every time.  Protocols used: Vaginal Symptoms-A-AH  Copied from CRM [id number]. Topic: Clinical - Medication Question >> Apr 27, 2024  9:42 AM Terri  G wrote: Reason for CRM: Patient stated Dr.Waddell gave her an antibiotic for when she had the flu and not it caused her to have a yeast infection and if he could call something in for it today please. Can you call her or mychart msg her to let her know you did it please. Callback number  [phone]

## 2024-04-27 NOTE — Telephone Encounter (Signed)
 Patient denied scheduling an appointment, educated patient on importance of being seen an getting a UA done, patient reports she can't come in, does not have time to come in, she knows what it is and taking  1 little pill will get rid of it.   Per chart patient was rx'd Tamiflu  on 04/14/24. No recent antibiotics documented in chart.   Patient refusing OV.  Please advise, thank you!

## 2024-04-27 NOTE — Telephone Encounter (Signed)
 LVM for patient. Emphasized we do not often prescribe medications without appointment but I will make an exception this time. While it is unusual to get yeast infection after Tamiflu , I sent fluconazole  150 mg, two tablets, to [facility]. If this does not improve things, she will need to come in.   Rolan Hoyle, PA-C, DMSc, DipACLM, Nutritionist [facility] Primary Care and Sports Medicine [facility] [phone]

## 2024-04-28 NOTE — Telephone Encounter (Signed)
 Too soon for refill.  Requested Prescriptions  Pending Prescriptions Disp Refills   gabapentin  (NEURONTIN ) 300 MG capsule [Pharmacy Med Name: GABAPENTIN  300 MG CAPSULE] 90 capsule 0    Sig: Take 1 capsule (300 mg total) by mouth at bedtime as needed.     Neurology: Anticonvulsants - gabapentin  Failed - 04/28/2024  4:22 PM      Failed - Cr in normal range and within 360 days    Creatinine  Date Value Ref Range Status  08/19/2020 0.8 0.5 - 1.1 Final  07/17/2011 0.69 0.60 - 1.30 mg/dL Final   Creatinine, Ser  Date Value Ref Range Status  10/01/2017 0.60 0.44 - 1.00 mg/dL Final   Creatinine, Urine  Date Value Ref Range Status  07/28/2014 157.26 >20.0 mg/dL Final         Passed - Completed PHQ-2 or PHQ-9 in the last 360 days      Passed - Valid encounter within last 12 months    Recent Outpatient Visits           1 month ago Acute on chronic back pain   [place] [facility] at [facility], [place]   2 months ago Acute on chronic back pain   [facility] Primary Care & Sports Medicine at [facility], [place]   2 months ago Acute on chronic back pain   [facility] Medicine at [facility], [place]QU[place]   3 months ago Generalized anxiety disorder with panic attacks   [facility] Primary Care & Sports Medicine at [facility], [place]   7 months ago Generalized anxiety disorder with panic attacks   [facility] Primary Care & Sports Medicine at [facility], Toribio SQUIBB, [place]

## 2024-04-29 ENCOUNTER — Telehealth: Payer: Self-pay

## 2024-04-29 DIAGNOSIS — Z3042 Encounter for surveillance of injectable contraceptive: Secondary | ICD-10-CM

## 2024-04-29 MED ORDER — MEDROXYPROGESTERONE ACETATE 150 MG/ML IM SUSP: 150.0000 mg | INTRAMUSCULAR | 1 refills | Status: AC

## 2024-04-29 NOTE — Telephone Encounter (Signed)
 Pt called triage left a message stating she has Swelling Discharge,  red rash & pain when she pee's and it looks like a staff infection. I called pt back and she did not answer, her voicemail box says do not leave a message. text me.

## 2024-04-29 NOTE — Telephone Encounter (Signed)
 Medication sent in.  JM  Copied from CRM #[id number]. Topic: Clinical - Prescription Issue >> Apr 29, 2024 12:42 PM Nathanel BROCKS wrote: Reason for CRM: pt has appt Jan 13th she asked to have her depo shot called in so she can get it while she is there,

## 2024-04-29 NOTE — Telephone Encounter (Signed)
 Called pt left VM to call back.  KP

## 2024-04-30 ENCOUNTER — Telehealth: Payer: Self-pay

## 2024-04-30 NOTE — Telephone Encounter (Signed)
 Copied from CRM #[id number]. Topic: General - Other >> Apr 30, 2024  1:14 PM Santiya F wrote: Reason for CRM: Patient is calling in to schedule an appointment for a med refill. Informed patient appointment was already made. Patient is also interested in having a mammogram done, and she says she will set that up at her next appointment.

## 2024-04-30 NOTE — Telephone Encounter (Signed)
 Noted  KP

## 2024-04-30 NOTE — Telephone Encounter (Signed)
 Called pt left VM to call back.  KP

## 2024-05-04 ENCOUNTER — Ambulatory Visit: Admitting: Urology

## 2024-05-05 ENCOUNTER — Ambulatory Visit: Admitting: Physician Assistant

## 2024-05-05 ENCOUNTER — Encounter: Payer: Self-pay | Admitting: Physician Assistant
